# Patient Record
Sex: Male | Born: 1985 | Race: White | Hispanic: No | Marital: Married | Smoking: Former smoker
Health system: Southern US, Community
[De-identification: ages and names within clinical notes are randomized; demographics above are authoritative.]

## PROBLEM LIST (undated history)

## (undated) DIAGNOSIS — F1910 Other psychoactive substance abuse, uncomplicated: Secondary | ICD-10-CM

## (undated) DIAGNOSIS — I77810 Thoracic aortic ectasia: Secondary | ICD-10-CM

## (undated) DIAGNOSIS — Z8619 Personal history of other infectious and parasitic diseases: Secondary | ICD-10-CM

## (undated) DIAGNOSIS — F32A Depression, unspecified: Secondary | ICD-10-CM

## (undated) DIAGNOSIS — N189 Chronic kidney disease, unspecified: Secondary | ICD-10-CM

## (undated) DIAGNOSIS — F329 Major depressive disorder, single episode, unspecified: Secondary | ICD-10-CM

## (undated) DIAGNOSIS — I10 Essential (primary) hypertension: Secondary | ICD-10-CM

## (undated) DIAGNOSIS — I480 Paroxysmal atrial fibrillation: Secondary | ICD-10-CM

## (undated) DIAGNOSIS — Z9289 Personal history of other medical treatment: Secondary | ICD-10-CM

## (undated) HISTORY — DX: Personal history of other medical treatment: Z92.89

## (undated) HISTORY — DX: Depression, unspecified: F32.A

## (undated) HISTORY — DX: Thoracic aortic ectasia: I77.810

## (undated) HISTORY — DX: Major depressive disorder, single episode, unspecified: F32.9

## (undated) HISTORY — DX: Essential (primary) hypertension: I10

## (undated) HISTORY — DX: Personal history of other infectious and parasitic diseases: Z86.19

## (undated) HISTORY — DX: Other psychoactive substance abuse, uncomplicated: F19.10

## (undated) HISTORY — DX: Paroxysmal atrial fibrillation: I48.0

## (undated) HISTORY — DX: Chronic kidney disease, unspecified: N18.9

---
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
## 2008-03-22 ENCOUNTER — Ambulatory Visit (HOSPITAL_COMMUNITY): Admission: RE | Admit: 2008-03-22 | Discharge: 2008-03-22 | Payer: Self-pay | Admitting: Urology

## 2008-08-31 ENCOUNTER — Emergency Department: Payer: Self-pay | Admitting: Emergency Medicine

## 2009-11-05 ENCOUNTER — Inpatient Hospital Stay: Payer: Self-pay | Admitting: Psychiatry

## 2009-11-16 ENCOUNTER — Emergency Department: Payer: Self-pay | Admitting: Emergency Medicine

## 2012-09-18 ENCOUNTER — Emergency Department: Payer: Self-pay | Admitting: Emergency Medicine

## 2012-10-08 ENCOUNTER — Emergency Department: Payer: Self-pay | Admitting: Emergency Medicine

## 2012-10-08 LAB — DRUG SCREEN, URINE
Barbiturates, Ur Screen: NEGATIVE (ref ?–200)
Benzodiazepine, Ur Scrn: NEGATIVE (ref ?–200)
Cocaine Metabolite,Ur ~~LOC~~: NEGATIVE (ref ?–300)
MDMA (Ecstasy)Ur Screen: NEGATIVE (ref ?–500)
Phencyclidine (PCP) Ur S: NEGATIVE (ref ?–25)

## 2012-10-08 LAB — URINALYSIS, COMPLETE
Bilirubin,UR: NEGATIVE
Ketone: NEGATIVE
Nitrite: NEGATIVE
Protein: NEGATIVE
RBC,UR: 1 /HPF (ref 0–5)
Squamous Epithelial: NONE SEEN

## 2012-10-08 LAB — COMPREHENSIVE METABOLIC PANEL
Albumin: 4.1 g/dL (ref 3.4–5.0)
BUN: 14 mg/dL (ref 7–18)
Chloride: 107 mmol/L (ref 98–107)
Creatinine: 1.18 mg/dL (ref 0.60–1.30)
Glucose: 117 mg/dL — ABNORMAL HIGH (ref 65–99)
Osmolality: 281 (ref 275–301)
SGOT(AST): 30 U/L (ref 15–37)

## 2012-10-09 LAB — CBC
MCH: 30.8 pg (ref 26.0–34.0)
Platelet: 211 10*3/uL (ref 150–440)

## 2014-04-12 ENCOUNTER — Inpatient Hospital Stay: Payer: Self-pay | Admitting: Internal Medicine

## 2014-04-12 LAB — COMPREHENSIVE METABOLIC PANEL
ALBUMIN: 4.3 g/dL (ref 3.4–5.0)
ALK PHOS: 57 U/L (ref 46–116)
ALT: 24 U/L (ref 14–63)
Anion Gap: 7 (ref 7–16)
BILIRUBIN TOTAL: 0.9 mg/dL (ref 0.2–1.0)
BUN: 13 mg/dL (ref 7–18)
CO2: 27 mmol/L (ref 21–32)
CREATININE: 1.43 mg/dL — AB (ref 0.60–1.30)
Calcium, Total: 9 mg/dL (ref 8.5–10.1)
Chloride: 103 mmol/L (ref 98–107)
EGFR (African American): >60
GLUCOSE: 101 mg/dL — AB (ref 65–99)
Osmolality: 274 (ref 275–301)
Potassium: 3.9 mmol/L (ref 3.5–5.1)
SGOT(AST): 11 U/L — ABNORMAL LOW (ref 15–37)
Sodium: 137 mmol/L (ref 136–145)
Total Protein: 7.1 g/dL (ref 6.4–8.2)

## 2014-04-12 LAB — CBC
HCT: 46.9 % (ref 40.0–52.0)
HGB: 15.6 g/dL (ref 13.0–18.0)
MCH: 30.5 pg (ref 26.0–34.0)
MCHC: 33.2 g/dL (ref 32.0–36.0)
MCV: 92 fL (ref 80–100)
PLATELETS: 222 10*3/uL (ref 150–440)
RBC: 5.1 10*6/uL (ref 4.40–5.90)
RDW: 12.7 % (ref 11.5–14.5)
WBC: 22.6 10*3/uL — AB (ref 3.8–10.6)

## 2014-04-12 LAB — DRUG SCREEN, URINE
AMPHETAMINES, UR SCREEN: NEGATIVE (ref ?–1000)
BARBITURATES, UR SCREEN: NEGATIVE (ref ?–200)
Benzodiazepine, Ur Scrn: NEGATIVE (ref ?–200)
COCAINE METABOLITE, UR ~~LOC~~: NEGATIVE (ref ?–300)
Cannabinoid 50 Ng, Ur ~~LOC~~: POSITIVE (ref ?–50)
MDMA (ECSTASY) UR SCREEN: NEGATIVE (ref ?–500)
Methadone, Ur Screen: NEGATIVE (ref ?–300)
Opiate, Ur Screen: POSITIVE (ref ?–300)
Phencyclidine (PCP) Ur S: NEGATIVE (ref ?–25)
TRICYCLIC, UR SCREEN: NEGATIVE (ref ?–1000)

## 2014-04-12 LAB — D-DIMER(ARMC): D-DIMER: 116 ng/mL

## 2014-04-12 LAB — PRO B NATRIURETIC PEPTIDE: B-TYPE NATIURETIC PEPTID: 28 pg/mL (ref 0–125)

## 2014-04-12 LAB — TROPONIN I

## 2014-04-13 LAB — CBC WITH DIFFERENTIAL/PLATELET
Basophil #: 0 10*3/uL (ref 0.0–0.1)
Basophil %: 0.1 %
EOS PCT: 0.2 %
Eosinophil #: 0 10*3/uL (ref 0.0–0.7)
HCT: 42.9 % (ref 40.0–52.0)
HGB: 14.4 g/dL (ref 13.0–18.0)
LYMPHS ABS: 1.4 10*3/uL (ref 1.0–3.6)
Lymphocyte %: 8.2 %
MCH: 30.8 pg (ref 26.0–34.0)
MCHC: 33.6 g/dL (ref 32.0–36.0)
MCV: 92 fL (ref 80–100)
MONO ABS: 1.1 x10 3/mm — AB (ref 0.2–1.0)
Monocyte %: 6.8 %
Neutrophil #: 14.3 10*3/uL — ABNORMAL HIGH (ref 1.4–6.5)
Neutrophil %: 84.7 %
PLATELETS: 204 10*3/uL (ref 150–440)
RBC: 4.69 10*6/uL (ref 4.40–5.90)
RDW: 12.5 % (ref 11.5–14.5)
WBC: 16.8 10*3/uL — ABNORMAL HIGH (ref 3.8–10.6)

## 2014-04-17 LAB — CULTURE, BLOOD (SINGLE)

## 2014-06-12 ENCOUNTER — Encounter: Payer: Self-pay | Admitting: Family Medicine

## 2014-06-12 ENCOUNTER — Encounter (INDEPENDENT_AMBULATORY_CARE_PROVIDER_SITE_OTHER): Payer: Self-pay

## 2014-06-12 ENCOUNTER — Ambulatory Visit (INDEPENDENT_AMBULATORY_CARE_PROVIDER_SITE_OTHER): Payer: Self-pay | Admitting: Family Medicine

## 2014-06-12 ENCOUNTER — Ambulatory Visit (INDEPENDENT_AMBULATORY_CARE_PROVIDER_SITE_OTHER)
Admission: RE | Admit: 2014-06-12 | Discharge: 2014-06-12 | Disposition: A | Discharge status: Home / Self Care | Payer: Self-pay | Source: Ambulatory Visit | Attending: Family Medicine | Admitting: Family Medicine

## 2014-06-12 ENCOUNTER — Telehealth: Payer: Self-pay | Admitting: Family Medicine

## 2014-06-12 VITALS — BP 170/110 | HR 61 | Temp 98.3°F | Ht 68.5 in | Wt 230.5 lb

## 2014-06-12 DIAGNOSIS — I10 Essential (primary) hypertension: Secondary | ICD-10-CM

## 2014-06-12 DIAGNOSIS — Z8701 Personal history of pneumonia (recurrent): Secondary | ICD-10-CM

## 2014-06-12 MED ORDER — AMLODIPINE BESYLATE 5 MG PO TABS: 5.0000 mg | ORAL_TABLET | Freq: Every day | ORAL | Status: DC

## 2014-06-12 NOTE — Patient Instructions (Signed)
Go to the lab on the way out.  We'll contact you with your xray report. Start amlodipine 5mg  a day for blood pressure.  Limit salt (especially fast food). Update me in about 1 week with your BP readings (call/fax/note). Let me know about your other BP medicine and dose from [facility]  We'll go from there.  Take care.

## 2014-06-12 NOTE — Telephone Encounter (Signed)
emmi emailed °

## 2014-06-12 NOTE — Progress Notes (Signed)
Pre visit review using our clinic review tool, if applicable. No additional management support is needed unless otherwise documented below in the visit note.  New patient.  Longstanding HTN.  Was on meds after recent hospitalization, doesn't know the name.  Didn't check BP on med.  Off med now.  H/o cocaine use, in remission.  Advised patient that cocaine use can be fatal.  D/w pt about BP meds and cocaine, along with salt, HTN path/phys.  No CP, SOB, BLE edema.   H/o PNA about 2 months ago, requesting records.  Treated with abx, no sx now.  No cough, no fevers.  Due for f/u CXR.  D/w pt.   PMH and SH reviewed  ROS: See HPI, otherwise noncontributory.  Meds, vitals, and allergies reviewed.   GEN: nad, alert and oriented HEENT: mucous membranes moist NECK: supple w/o LA CV: rrr.  no murmur PULM: ctab, no inc wob ABD: soft, +bs EXT: no edema SKIN: no acute rash

## 2014-06-13 ENCOUNTER — Encounter: Payer: Self-pay | Admitting: Family Medicine

## 2014-06-13 ENCOUNTER — Telehealth: Payer: Self-pay

## 2014-06-13 DIAGNOSIS — I10 Essential (primary) hypertension: Secondary | ICD-10-CM | POA: Insufficient documentation

## 2014-06-13 DIAGNOSIS — Z8701 Personal history of pneumonia (recurrent): Secondary | ICD-10-CM | POA: Insufficient documentation

## 2014-06-13 NOTE — Telephone Encounter (Signed)
Patient aware of normal xray.

## 2014-06-13 NOTE — Assessment & Plan Note (Signed)
See above.  Start amlodipine, avoid BB (denies current cocaine use), he'll update me with BP readings.  Avoid salt.  Will check inpatient records.  >30 minutes spent in face to face time with patient, >50% spent in counselling or coordination of care.

## 2014-06-13 NOTE — Telephone Encounter (Signed)
-----   Message from [name], MD sent at 06/13/2014 11:24 AM EDT ----- Call pt.  CXR wnl.   Thanks.

## 2014-06-13 NOTE — Assessment & Plan Note (Signed)
Recheck cxr today, requesting records.

## 2014-07-15 NOTE — Discharge Summary (Signed)
PATIENT NAME:  Jeremiah Keith, Jeremiah Keith MR#:  [id number] DATE OF BIRTH:  [birth year]  DATE OF ADMISSION:  04/12/2014 DATE OF DISCHARGE:  04/13/2014  PRESENTING COMPLAINT: Shortness of breath and chest pain.   DISCHARGE DIAGNOSES:  1.  Right-sided pneumonia.  2.  Hypertension.  3.  Tobacco abuse.  DISCHARGE VITAL SIGNS: Saturation on discharge was 97% on room air. Temperature is 97.5, pulse is 75. Blood pressure is 165/96.   MEDICATIONS AT DISCHARGE:  1.  Metoprolol extended release 50 mg p.o. daily.  2.  Keflex 500 mg 3 times a day.  3.  Doxycycline 100 mg b.i.d.   FOLLOWUP: Patient advised to establish primary care in the area. If needed, go to the Emergency Room or Urgent Care.   DISCHARGE PHYSICAL EXAMINATION: LUNGS: Clear to auscultation. No wheezing, crepitations.  HEART: Both the heart sounds are normal. No murmur heard.  ABDOMEN: Soft, nontender. No organomegaly.  NEUROLOGICAL: Grossly intact cranial nerves II-XII. No motor or sensory deficit. This was on the day of discharge.   BRIEF SUMMARY OF HOSPITAL COURSE: Mr. Gima is a 29 year old Caucasian gentleman with history of hypertension, does not take any medications, comes into the Emergency Room with:  1.  Right-sided pleuritic chest pain, was found to have right lower lobe pneumonia. He was started on IV antibiotics. The patient remained clinically stable. His saturations were 97% on room air. He has changed to p.o. Keflex and doxycycline, which is generic, to help out with the cost. The patient will finish up the antibiotics and was also asked to follow up with Urgent Care or Emergency Room if needed. He is also recommended to establish with primary care physician.  2.  Hypertension. The patient was prescribed metoprolol extended release p.o. daily. Hospital stay otherwise remained stable.   CODE STATUS: The patient remained a full code.   TIME SPENT: Forty minutes.   ____________________________ Hart Rochester Posey Pronto,  MD sap:TT D: 04/17/2014 17:25:02 ET T: 04/17/2014 21:34:54 ET JOB#: [id number]  cc: Deajah Erkkila A. Posey Pronto, MD, <Dictator> Ilda Basset MD ELECTRONICALLY SIGNED 05/03/2014 23:17

## 2014-07-15 NOTE — H&P (Signed)
PATIENT NAME:  Jeremiah Keith, Jeremiah Keith MR#:  [id number] DATE OF BIRTH:  [birth year]  DATE OF ADMISSION:  04/12/2014  PRIMARY CARE PHYSICIAN: None.   REFERRING EMERGENCY ROOM PHYSICIAN: Sheryl L. Benjaman Lobe, MD   CHIEF COMPLAINT: Chest pain and coughing.   HISTORY OF PRESENTING ILLNESS: This is 29 year old male, who has a past medical history of hypertension, but he does not follow with any doctor and does not take medications. He is a smoker and for the last 2 weeks, he says that he has on and off cough, but no sputum production, and he started to notice some tingling sensation in his back in the central area, just coming on and off, but did not pay much attention to that. Cough continued to get worse and last 2 nights, he had cold sweat and excessively sweating. Today morning, he woke up around 2:30 a.m. with severe pain, which was mainly on his left side of central area of the chest. The pain was sharp and stabbing-like, like almost 10/10 and on and off, getting worse by deep breathing or coughing or any movement, so he decided to come to the Emergency Room. In the ER, his troponin was found to be negative. Chest x-ray was negative, but had white cell count up to 20,000. So, CAT scan of chest was done, which showed he has right lower lobe infiltrate, so given to hospitalist team for further management of his pain and pneumonia issues.   REVIEW OF SYSTEMS:  CONSTITUTIONAL: Negative for fever, fatigue, weakness. Had chest pain. No weight loss or weight again.  EYES: No blurring, double vision, discharge, or redness.  EARS, NOSE, THROAT: No tinnitus, ear pain, or hearing loss.  RESPIRATORY: The patient has some coughing. No sputum production. No wheezing or shortness of breath.  CARDIOVASCULAR: Had chest pain on the left side of central. No palpitations or edema.  GASTROINTESTINAL: No nausea, vomiting, diarrhea, abdominal pain.  GENITOURINARY: No dysuria, hematuria, or increased frequency.  ENDOCRINE:  No heat or cold intolerance. No excessive sweating.  SKIN: No acne, rashes, or lesions.  MUSCULOSKELETAL: No pain or swelling in the joints.  NEUROLOGICAL: No numbness, weakness, tremor, or vertigo.   PSYCHIATRIC: No anxiety, insomnia, or bipolar disorder.   PAST MEDICAL HISTORY: Hypertension, but he does not seek any attention, or any medication.   PAST SURGICAL HISTORY: None.   SOCIAL HISTORY: He lives with girlfriend and smokes 1/2 pack cigarettes since he was 29 years old, so that is for 18 years now. He was a social drinker over weekends, but stopped drinking for the last 2 weeks. He smokes pot and last was yesterday.   FAMILY HISTORY: Positive for having coronary artery disease in his father at age of 77 and having cardiac pacemaker in his grandfather.   HOME MEDICATIONS: None.   PHYSICAL EXAMINATION: VITAL SIGNS: In the ER, temperature 98.4, pulse 91, respirations 20, blood pressure 165/102, and pulse oximetry is 99% on room air.  GENERAL: The patient is fully alert and oriented to time, place, and person. Does not appear in acute distress.  HEENT: Head and neck atraumatic. Conjunctivae pink. Oral mucosa moist.  NECK: Supple. No JVD. Thyroid nontender.  RESPIRATORY: Bilateral equal and clear air entry. No wheezing or crepitation heard.  CARDIOVASCULAR: S1, S2 present, regular. No murmur. No local tenderness on the chest.  ABDOMEN: Soft, nontender. Bowel sounds present. No organomegaly.  SKIN: No acne, rashes, or lesions.  JOINTS: No swelling or tenderness.  NEUROLOGICAL: Power 5/5, follows commands, and moves  all 4 limbs. No tremor or rigidity. Sensation is intact.  PSYCHIATRIC: Does not appear any acute psychiatric illness at this time.   IMPORTANT LABORATORY RESULTS: Glucose 101, BNP is 28, BUN 13, creatinine 1.43, sodium 137, potassium 3.9, chloride 103, CO2 27, calcium is 9. Total protein 7.1, albumin is 4.3, alkaline phosphate 57, SGOT 11, SGPT 24. Troponin less than 0.02.  Urine for toxicology: Positive for cocaine. WBC 22.6, hemoglobin 15.6, and platelet count 222. MCV is 92. D-dimer is 116.    IMAGING STUDIES: CAT scan shows focal airspace disease in the medial right lower lobe, finding compatible with right lower lobe pneumonia. No evidence of dissection or pulmonary embolism.   ASSESSMENT AND PLAN: A 29 year old male who has a past history of smoking, hypertension, and family history of having coronary artery disease, came with chest pain and found having pneumonia on CT scan.  1. Pneumonia. We will give Rocephin and zithromax for this in hospital and give some nebulizer treatment, incentive spirometry, and try to get sputum culture for confirmation.  2. Chest pain. Most likely this is pleuritic pain because of his pneumonia, as it gets worse with coughing and deep breathing. As he is a smoker and has a history of hypertension, we would also just follow serial troponins to rule out cardiac issues. If first troponin is negative, admit to telemetry and follow serial troponins, and he does not need any further work-up after that if it is negative. Meanwhile, we will give Percocet to help him with chest pain.  3. Hypertension. His blood pressure is elevated, and he was not taking any medications. I will start him on amlodipine for now.  4. Mild renal insufficiency. Creatinine is 1.49. Most likely, this is secondary to his pneumonia and excessive sweating. We will encourage oral intake. He is young and ready to drink a lot of liquids, so there should not be any problem. We will check tomorrow.  5. Smoking cessation. Counseling is done for 4 minutes and offered him nicotine patch or inhalers. He does not want that and said he would be fine without that.   CODE STATUS: Full code.   TOTAL TIME SPENT ON THIS ADMISSION: 50 minutes     ____________________________ Ceasar Lund Anselm Jungling, MD vgv:mw D: 04/12/2014 12:09:04 ET T: 04/12/2014 13:26:39  ET JOB#: [id number]  cc: Ceasar Lund. Anselm Jungling, MD, <Dictator> Vaughan Basta MD ELECTRONICALLY SIGNED 04/30/2014 9:57

## 2015-04-22 ENCOUNTER — Emergency Department
Admission: EM | Admit: 2015-04-22 | Discharge: 2015-04-22 | Disposition: A | Discharge status: Home / Self Care | Payer: BLUE CROSS/BLUE SHIELD | Attending: Emergency Medicine | Admitting: Emergency Medicine

## 2015-04-22 DIAGNOSIS — M62830 Muscle spasm of back: Secondary | ICD-10-CM | POA: Diagnosis not present

## 2015-04-22 DIAGNOSIS — Z880 Allergy status to penicillin: Secondary | ICD-10-CM | POA: Diagnosis not present

## 2015-04-22 DIAGNOSIS — Z79899 Other long term (current) drug therapy: Secondary | ICD-10-CM | POA: Diagnosis not present

## 2015-04-22 DIAGNOSIS — M545 Low back pain, unspecified: Secondary | ICD-10-CM

## 2015-04-22 DIAGNOSIS — F17210 Nicotine dependence, cigarettes, uncomplicated: Secondary | ICD-10-CM | POA: Insufficient documentation

## 2015-04-22 DIAGNOSIS — I10 Essential (primary) hypertension: Secondary | ICD-10-CM | POA: Insufficient documentation

## 2015-04-22 MED ORDER — KETOROLAC TROMETHAMINE 60 MG/2ML IM SOLN
60.0000 mg | Freq: Once | INTRAMUSCULAR | Status: AC
  Administered 2015-04-22: 60 mg via INTRAMUSCULAR
  Filled 2015-04-22: qty 2

## 2015-04-22 MED ORDER — METHOCARBAMOL 500 MG PO TABS: 500.0000 mg | ORAL_TABLET | Freq: Three times a day (TID) | ORAL | Status: DC

## 2015-04-22 MED ORDER — CYCLOBENZAPRINE HCL 10 MG PO TABS: 10.0000 mg | ORAL_TABLET | Freq: Three times a day (TID) | ORAL | Status: DC | PRN

## 2015-04-22 MED ORDER — IBUPROFEN 800 MG PO TABS: 800.0000 mg | ORAL_TABLET | Freq: Three times a day (TID) | ORAL | Status: DC | PRN

## 2015-04-22 MED ORDER — TRAMADOL HCL 50 MG PO TABS: 50.0000 mg | ORAL_TABLET | Freq: Four times a day (QID) | ORAL | Status: DC | PRN

## 2015-04-22 NOTE — Discharge Instructions (Signed)
Back Pain, Adult Back pain is very common. The pain often gets better over time. The cause of back pain is usually not dangerous. Most people can learn to manage their back pain on their own.  HOME CARE  Watch your back pain for any changes. The following actions may help to lessen any pain you are feeling:  Stay active. Start with short walks on flat ground if you can. Try to walk farther each day.  Exercise regularly as told by your doctor. Exercise helps your back heal faster. It also helps avoid future injury by keeping your muscles strong and flexible.  Do not sit, drive, or stand in one place for more than 30 minutes.  Do not stay in bed. Resting more than 1-2 days can slow down your recovery.  Be careful when you bend or lift an object. Use good form when lifting:  Bend at your knees.  Keep the object close to your body.  Do not twist.  Sleep on a firm mattress. Lie on your side, and bend your knees. If you lie on your back, put a pillow under your knees.  Take medicines only as told by your doctor.  Put ice on the injured area.  Put ice in a plastic bag.  Place a towel between your skin and the bag.  Leave the ice on for 20 minutes, 2-3 times a day for the first 2-3 days. After that, you can switch between ice and heat packs.  Avoid feeling anxious or stressed. Find good ways to deal with stress, such as exercise.  Maintain a healthy weight. Extra weight puts stress on your back. GET HELP IF:   You have pain that does not go away with rest or medicine.  You have worsening pain that goes down into your legs or buttocks.  You have pain that does not get better in one week.  You have pain at night.  You lose weight.  You have a fever or chills. GET HELP RIGHT AWAY IF:  1. You cannot control when you poop (bowel movement) or pee (urinate). 2. Your arms or legs feel weak. 3. Your arms or legs lose feeling (numbness). 4. You feel sick to your stomach (nauseous)  or throw up (vomit). 5. You have belly (abdominal) pain. 6. You feel like you may pass out (faint).   This information is not intended to replace advice given to you by your health care provider. Make sure you discuss any questions you have with your health care provider.   Document Released: 08/19/2007 Document Revised: 03/23/2014 Document Reviewed: 07/04/2013 Elsevier Interactive Patient Education 2016 [place].  Back Exercises If you have pain in your back, do these exercises 2-3 times each day or as told by your doctor. When the pain goes away, do the exercises once each day, but repeat the steps more times for each exercise (do more repetitions). If you do not have pain in your back, do these exercises once each day or as told by your doctor. EXERCISES Single Knee to Chest Do these steps 3-5 times in a row for each leg:  Lie on your back on a firm bed or the floor with your legs stretched out.  Bring one knee to your chest.  Hold your knee to your chest by grabbing your knee or thigh.  Pull on your knee until you feel a gentle stretch in your lower back.  Keep doing the stretch for 10-30 seconds.  Slowly let go of your  leg and straighten it. Pelvic Tilt Do these steps 5-10 times in a row:  Lie on your back on a firm bed or the floor with your legs stretched out.  Bend your knees so they point up to the ceiling. Your feet should be flat on the floor.  Tighten your lower belly (abdomen) muscles to press your lower back against the floor. This will make your tailbone point up to the ceiling instead of pointing down to your feet or the floor.  Stay in this position for 5-10 seconds while you gently tighten your muscles and breathe evenly. Cat-Cow Do these steps until your lower back bends more easily: 7. Get on your hands and knees on a firm surface. Keep your hands under your shoulders, and keep your knees under your hips. You may put padding under your knees. 8. Let  your head hang down, and make your tailbone point down to the floor so your lower back is round like the back of a cat. 9. Stay in this position for 5 seconds. 10. Slowly lift your head and make your tailbone point up to the ceiling so your back hangs low (sags) like the back of a cow. 11. Stay in this position for 5 seconds. Press-Ups Do these steps 5-10 times in a row: 1. Lie on your belly (face-down) on the floor. 2. Place your hands near your head, about shoulder-width apart. 3. While you keep your back relaxed and keep your hips on the floor, slowly straighten your arms to raise the top half of your body and lift your shoulders. Do not use your back muscles. To make yourself more comfortable, you may change where you place your hands. 4. Stay in this position for 5 seconds. 5. Slowly return to lying flat on the floor. Bridges Do these steps 10 times in a row: 1. Lie on your back on a firm surface. 2. Bend your knees so they point up to the ceiling. Your feet should be flat on the floor. 3. Tighten your butt muscles and lift your butt off of the floor until your waist is almost as high as your knees. If you do not feel the muscles working in your butt and the back of your thighs, slide your feet 1-2 inches farther away from your butt. 4. Stay in this position for 3-5 seconds. 5. Slowly lower your butt to the floor, and let your butt muscles relax. If this exercise is too easy, try doing it with your arms crossed over your chest. Belly Crunches Do these steps 5-10 times in a row: 1. Lie on your back on a firm bed or the floor with your legs stretched out. 2. Bend your knees so they point up to the ceiling. Your feet should be flat on the floor. 3. Cross your arms over your chest. 4. Tip your chin a little bit toward your chest but do not bend your neck. 5. Tighten your belly muscles and slowly raise your chest just enough to lift your shoulder blades a tiny bit off of the  floor. 6. Slowly lower your chest and your head to the floor. Back Lifts Do these steps 5-10 times in a row: 1. Lie on your belly (face-down) with your arms at your sides, and rest your forehead on the floor. 2. Tighten the muscles in your legs and your butt. 3. Slowly lift your chest off of the floor while you keep your hips on the floor. Keep the back of your head  in line with the curve in your back. Look at the floor while you do this. 4. Stay in this position for 3-5 seconds. 5. Slowly lower your chest and your face to the floor. GET HELP IF:  Your back pain gets a lot worse when you do an exercise.  Your back pain does not lessen 2 hours after you exercise. If you have any of these problems, stop doing the exercises. Do not do them again unless your doctor says it is okay. GET HELP RIGHT AWAY IF:  You have sudden, very bad back pain. If this happens, stop doing the exercises. Do not do them again unless your doctor says it is okay.   This information is not intended to replace advice given to you by your health care provider. Make sure you discuss any questions you have with your health care provider.   Document Released: 04/04/2010 Document Revised: 11/21/2014 Document Reviewed: 04/26/2014 Elsevier Interactive Patient Education 2016 [employer]  Muscle Cramps and Spasms Muscle cramps and spasms are when muscles tighten by themselves. They usually get better within minutes. Muscle cramps are painful. They are usually stronger and last longer than muscle spasms. Muscle spasms may or may not be painful. They can last a few seconds or much longer. HOME CARE  Drink enough fluid to keep your pee (urine) clear or pale yellow.  Massage, stretch, and relax the muscle.  Use a warm towel, heating pad, or warm shower water on tight muscles.  Place ice on the muscle if it is tender or in pain.  Put ice in a plastic bag.  Place a towel between your skin and the bag.  Leave the ice  on for 15-20 minutes, 03-04 times a day.  Only take medicine as told by your doctor. GET HELP RIGHT AWAY IF:  Your cramps or spasms get worse, happen more often, or do not get better with time. MAKE SURE YOU:  Understand these instructions.  Will watch your condition.  Will get help right away if you are not doing well or get worse.   This information is not intended to replace advice given to you by your health care provider. Make sure you discuss any questions you have with your health care provider.   Document Released: 02/13/2008 Document Revised: 06/27/2012 Document Reviewed: 02/17/2012 Elsevier Interactive Patient Education [employer].

## 2015-04-22 NOTE — ED Notes (Signed)
Pt arrived to ED with c/o lower back pain x 3 days. Pt denies injury but does reports heavy lifting at work. Pt reports increased pain with movement.

## 2015-04-22 NOTE — ED Provider Notes (Signed)
[facility] Emergency Department Provider Note  ____________________________________________  Time seen: Approximately 7:35 PM  I have reviewed the triage vital signs and the nursing notes.   HISTORY  Chief Complaint Back Pain    HPI Jeremiah Keith is a 30 y.o. male , NAD, presents to the emergency department with lower back pain 3 days that is worsening. Denies any specific injuries or traumas but notes he lifts and moves heavy items at work on a daily basis. Has been taking Goody powders with no alleviation. States it's hard to sleep at night due to pain. Pain increases with laying flat or sitting for longer periods of time. Denies any numbness, weakness, tingling. No changes in bowel or urinary habits. Denies any saddle paresthesias. No history of lower back pain.   Past Medical History  Diagnosis Date  . History of chicken pox   . Hypertension   . Depression     after death of family members  . Substance abuse     h/o cocaine abuse    Patient Active Problem List   Diagnosis Date Noted  . History of pneumonia 06/13/2014  . HTN (hypertension) 06/13/2014    Past Surgical History  Procedure Laterality Date  . Tonsillectomy and adenoidectomy  1990    Current Outpatient Rx  Name  Route  Sig  Dispense  Refill  . amLODipine (NORVASC) 5 MG tablet   Oral   Take 1 tablet (5 mg total) by mouth daily.   30 tablet   3   . ibuprofen (ADVIL,MOTRIN) 800 MG tablet   Oral   Take 1 tablet (800 mg total) by mouth every 8 (eight) hours as needed (pain).   60 tablet   0   . ibuprofen (ADVIL,MOTRIN) 800 MG tablet   Oral   Take 1 tablet (800 mg total) by mouth every 8 (eight) hours as needed (pain).   60 tablet   0   . methocarbamol (ROBAXIN) 500 MG tablet   Oral   Take 1 tablet (500 mg total) by mouth 3 (three) times daily.   21 tablet   0   . traMADol (ULTRAM) 50 MG tablet   Oral   Take 1 tablet (50 mg total) by mouth every 6 (six) hours  as needed.   10 tablet   0     Allergies Penicillins  Family History  Problem Relation Age of Onset  . Heart disease Mother   . Diabetes Father   . Hypertension Father   . Prostate cancer Maternal Grandfather     possible prostate cancer  . Colon cancer Neg Hx     Social History Social History  Substance Use Topics  . Smoking status: Current Every Day Smoker -- 0.50 packs/day    Types: Cigarettes  . Smokeless tobacco: Never Used  . Alcohol Use: 0.0 oz/week    0 Standard drinks or equivalent per week     Review of Systems  Constitutional: No fever/chills, fatigue.  Cardiovascular: No chest pain. Respiratory: No shortness of breath.  Gastrointestinal: No abdominal pain.  No nausea, vomiting.   Genitourinary: Negative for dysuria. No hematuria. No urinary hesitancy, urgency or increased frequency. Musculoskeletal: Positive for back pain.  Skin: Negative for rash. Neurological: Negative for headaches, focal weakness or numbness. 10-point ROS otherwise negative.  ____________________________________________   PHYSICAL EXAM:  VITAL SIGNS: ED Triage Vitals  Enc Vitals Group     BP --      Pulse --  Resp --      Temp --      Temp src --      SpO2 --      Weight --      Height --      Head Cir --      Peak Flow --      Pain Score --      Pain Loc --      Pain Edu? --      Excl. in [place]? --     Constitutional: Alert and oriented. Well appearing and in no acute distress. Eyes: Conjunctivae are normal.  Head: Atraumatic. Respiratory: Normal respiratory effort without tachypnea or retractions. Musculoskeletal: Diffuse tenderness to palpation over the lumbar spine. Mild muscle spasm noted left of the lumbar spine. No SI joint tenderness to palpation. Negative straight leg raise bilaterally. Full range of motion of lumbar spine with some discomfort with full flexion, left lateral flexion. Neurologic:  Normal speech and language. No gross focal neurologic  deficits are appreciated.  Skin:  Skin is warm, dry and intact. No rash noted. Psychiatric: Mood and affect are normal. Speech and behavior are normal. Patient exhibits appropriate insight and judgement.   ____________________________________________   LABS  None  ____________________________________________  EKG  None ____________________________________________  RADIOLOGY  None  ____________________________________________    PROCEDURES  Procedure(s) performed: None    Medications  ketorolac (TORADOL) injection 60 mg (60 mg Intramuscular Given 04/22/15 1957)     ____________________________________________   INITIAL IMPRESSION / ASSESSMENT AND PLAN / ED COURSE  Patient's diagnosis is consistent with lumbar go with muscle spasm. Patient will be discharged home with prescriptions for private 800 mg to take 3 times daily with meals as needed for pain and inflammation, cyclobenzaprine 10 mg to take one tablet by mouth up to 3 times daily as needed for muscle spasm, Ultram 50 mg to take one tablet by mouth every 6 hours as needed for severe pain and to be used sparingly. Patient advised against use of alcohol or operating motor vehicles or heavy machinery while taking cyclobenzaprine and/or tramadol.  Will provide a work note to be out for 2 days then return on light duty including no lifting greater than 10 pounds for one week. Patient is to follow up with [facility] clinic to be cleared to work full duty and to follow up on back pain. Patient is given ED precautions to return to the ED for any worsening or new symptoms.    ____________________________________________  FINAL CLINICAL IMPRESSION(S) / ED DIAGNOSES  Final diagnoses:  Bilateral low back pain without sciatica  Muscle spasm of back      NEW MEDICATIONS STARTED DURING THIS VISIT:  New Prescriptions   IBUPROFEN (ADVIL,MOTRIN) 800 MG TABLET    Take 1 tablet (800 mg total) by mouth every 8 (eight) hours as  needed (pain).   IBUPROFEN (ADVIL,MOTRIN) 800 MG TABLET    Take 1 tablet (800 mg total) by mouth every 8 (eight) hours as needed (pain).   METHOCARBAMOL (ROBAXIN) 500 MG TABLET    Take 1 tablet (500 mg total) by mouth 3 (three) times daily.   TRAMADOL (ULTRAM) 50 MG TABLET    Take 1 tablet (50 mg total) by mouth every 6 (six) hours as needed.         Braxton Feathers, PA-C 04/22/15 2014  Lavonia Drafts, MD 04/22/15 2325

## 2015-05-01 ENCOUNTER — Emergency Department (HOSPITAL_COMMUNITY)
Admission: EM | Admit: 2015-05-01 | Discharge: 2015-05-01 | Disposition: A | Discharge status: Home / Self Care | Payer: BLUE CROSS/BLUE SHIELD | Attending: Emergency Medicine | Admitting: Emergency Medicine

## 2015-05-01 ENCOUNTER — Encounter (HOSPITAL_COMMUNITY): Payer: Self-pay | Admitting: *Deleted

## 2015-05-01 DIAGNOSIS — Z79899 Other long term (current) drug therapy: Secondary | ICD-10-CM | POA: Insufficient documentation

## 2015-05-01 DIAGNOSIS — Z880 Allergy status to penicillin: Secondary | ICD-10-CM | POA: Insufficient documentation

## 2015-05-01 DIAGNOSIS — F17210 Nicotine dependence, cigarettes, uncomplicated: Secondary | ICD-10-CM | POA: Insufficient documentation

## 2015-05-01 DIAGNOSIS — M5441 Lumbago with sciatica, right side: Secondary | ICD-10-CM | POA: Insufficient documentation

## 2015-05-01 DIAGNOSIS — Z8619 Personal history of other infectious and parasitic diseases: Secondary | ICD-10-CM | POA: Diagnosis not present

## 2015-05-01 DIAGNOSIS — I10 Essential (primary) hypertension: Secondary | ICD-10-CM | POA: Diagnosis not present

## 2015-05-01 DIAGNOSIS — M545 Low back pain: Secondary | ICD-10-CM | POA: Diagnosis present

## 2015-05-01 DIAGNOSIS — F329 Major depressive disorder, single episode, unspecified: Secondary | ICD-10-CM | POA: Insufficient documentation

## 2015-05-01 MED ORDER — KETOROLAC TROMETHAMINE 60 MG/2ML IM SOLN
60.0000 mg | Freq: Once | INTRAMUSCULAR | Status: AC
  Administered 2015-05-01: 60 mg via INTRAMUSCULAR
  Filled 2015-05-01: qty 2

## 2015-05-01 MED ORDER — METHOCARBAMOL 500 MG PO TABS
500.0000 mg | ORAL_TABLET | Freq: Once | ORAL | Status: AC
  Administered 2015-05-01: 500 mg via ORAL
  Filled 2015-05-01: qty 1

## 2015-05-01 MED ORDER — PREDNISONE 50 MG PO TABS: ORAL_TABLET | ORAL | Status: DC

## 2015-05-01 MED ORDER — CYCLOBENZAPRINE HCL 10 MG PO TABS: 10.0000 mg | ORAL_TABLET | Freq: Two times a day (BID) | ORAL | Status: DC | PRN

## 2015-05-01 NOTE — ED Notes (Signed)
PT reports ongoing Lower back pain. Last seen 04-22-15. Pain has not improved. Pt reports pain  is worse with sitting. Pt also has elevated BP . Pt states" I have BP meds but refuse to take the med."

## 2015-05-01 NOTE — ED Notes (Signed)
Declined W/C at D/C and was escorted to lobby by RN. 

## 2015-05-01 NOTE — Discharge Instructions (Signed)
Sciatica With Rehab The sciatic nerve runs from the back down the leg and is responsible for sensation and control of the muscles in the back (posterior) side of the thigh, lower leg, and foot. Sciatica is a condition that is characterized by inflammation of this nerve.  SYMPTOMS   Signs of nerve damage, including numbness and/or weakness along the posterior side of the lower extremity.  Pain in the back of the thigh that may also travel down the leg.  Pain that worsens when sitting for long periods of time.  Occasionally, pain in the back or buttock. CAUSES  Inflammation of the sciatic nerve is the cause of sciatica. The inflammation is due to something irritating the nerve. Common sources of irritation include:  Sitting for long periods of time.  Direct trauma to the nerve.  Arthritis of the spine.  Herniated or ruptured disk.  Slipping of the vertebrae (spondylolisthesis).  Pressure from soft tissues, such as muscles or ligament-like tissue (fascia). RISK INCREASES WITH:  Sports that place pressure or stress on the spine (football or weightlifting).  Poor strength and flexibility.  Failure to warm up properly before activity.  Family history of low back pain or disk disorders.  Previous back injury or surgery.  Poor body mechanics, especially when lifting, or poor posture. PREVENTION   Warm up and stretch properly before activity.  Maintain physical fitness:  Strength, flexibility, and endurance.  Cardiovascular fitness.  Learn and use proper technique, especially with posture and lifting. When possible, have coach correct improper technique.  Avoid activities that place stress on the spine. PROGNOSIS If treated properly, then sciatica usually resolves within 6 weeks. However, occasionally surgery is necessary.  RELATED COMPLICATIONS   Permanent nerve damage, including pain, numbness, tingle, or weakness.  Chronic back pain.  Risks of surgery: infection,  bleeding, nerve damage, or damage to surrounding tissues. TREATMENT Treatment initially involves resting from any activities that aggravate your symptoms. The use of ice and medication may help reduce pain and inflammation. The use of strengthening and stretching exercises may help reduce pain with activity. These exercises may be performed at home or with referral to a therapist. A therapist may recommend further treatments, such as transcutaneous electronic nerve stimulation (TENS) or ultrasound. Your caregiver may recommend corticosteroid injections to help reduce inflammation of the sciatic nerve. If symptoms persist despite non-surgical (conservative) treatment, then surgery may be recommended. MEDICATION  If pain medication is necessary, then nonsteroidal anti-inflammatory medications, such as aspirin and ibuprofen, or other minor pain relievers, such as acetaminophen, are often recommended.  Do not take pain medication for 7 days before surgery.  Prescription pain relievers may be given if deemed necessary by your caregiver. Use only as directed and only as much as you need.  Ointments applied to the skin may be helpful.  Corticosteroid injections may be given by your caregiver. These injections should be reserved for the most serious cases, because they may only be given a certain number of times. HEAT AND COLD  Cold treatment (icing) relieves pain and reduces inflammation. Cold treatment should be applied for 10 to 15 minutes every 2 to 3 hours for inflammation and pain and immediately after any activity that aggravates your symptoms. Use ice packs or massage the area with a piece of ice (ice massage).  Heat treatment may be used prior to performing the stretching and strengthening activities prescribed by your caregiver, physical therapist, or athletic trainer. Use a heat pack or soak the injury in warm water.  SEEK MEDICAL CARE IF:  Treatment seems to offer no benefit, or the condition  worsens.  Any medications produce adverse side effects. EXERCISES  RANGE OF MOTION (ROM) AND STRETCHING EXERCISES - Sciatica Most people with sciatic will find that their symptoms worsen with either excessive bending forward (flexion) or arching at the low back (extension). The exercises which will help resolve your symptoms will focus on the opposite motion. Your physician, physical therapist or athletic trainer will help you determine which exercises will be most helpful to resolve your low back pain. Do not complete any exercises without first consulting with your clinician. Discontinue any exercises which worsen your symptoms until you speak to your clinician. If you have pain, numbness or tingling which travels down into your buttocks, leg or foot, the goal of the therapy is for these symptoms to move closer to your back and eventually resolve. Occasionally, these leg symptoms will get better, but your low back pain may worsen; this is typically an indication of progress in your rehabilitation. Be certain to be very alert to any changes in your symptoms and the activities in which you participated in the 24 hours prior to the change. Sharing this information with your clinician will allow him/her to most efficiently treat your condition. These exercises may help you when beginning to rehabilitate your injury. Your symptoms may resolve with or without further involvement from your physician, physical therapist or athletic trainer. While completing these exercises, remember:   Restoring tissue flexibility helps normal motion to return to the joints. This allows healthier, less painful movement and activity.  An effective stretch should be held for at least 30 seconds.  A stretch should never be painful. You should only feel a gentle lengthening or release in the stretched tissue. FLEXION RANGE OF MOTION AND STRETCHING EXERCISES: STRETCH - Flexion, Single Knee to Chest   Lie on a firm bed or floor  with both legs extended in front of you.  Keeping one leg in contact with the floor, bring your opposite knee to your chest. Hold your leg in place by either grabbing behind your thigh or at your knee.  Pull until you feel a gentle stretch in your low back. Hold __________ seconds.  Slowly release your grasp and repeat the exercise with the opposite side. Repeat __________ times. Complete this exercise __________ times per day.  STRETCH - Flexion, Double Knee to Chest  Lie on a firm bed or floor with both legs extended in front of you.  Keeping one leg in contact with the floor, bring your opposite knee to your chest.  Tense your stomach muscles to support your back and then lift your other knee to your chest. Hold your legs in place by either grabbing behind your thighs or at your knees.  Pull both knees toward your chest until you feel a gentle stretch in your low back. Hold __________ seconds.  Tense your stomach muscles and slowly return one leg at a time to the floor. Repeat __________ times. Complete this exercise __________ times per day.  STRETCH - Low Trunk Rotation   Lie on a firm bed or floor. Keeping your legs in front of you, bend your knees so they are both pointed toward the ceiling and your feet are flat on the floor.  Extend your arms out to the side. This will stabilize your upper body by keeping your shoulders in contact with the floor.  Gently and slowly drop both knees together to one side until  you feel a gentle stretch in your low back. Hold for __________ seconds.  Tense your stomach muscles to support your low back as you bring your knees back to the starting position. Repeat the exercise to the other side. Repeat __________ times. Complete this exercise __________ times per day  EXTENSION RANGE OF MOTION AND FLEXIBILITY EXERCISES: STRETCH - Extension, Prone on Elbows  Lie on your stomach on the floor, a bed will be too soft. Place your palms about shoulder  width apart and at the height of your head.  Place your elbows under your shoulders. If this is too painful, stack pillows under your chest.  Allow your body to relax so that your hips drop lower and make contact more completely with the floor.  Hold this position for __________ seconds.  Slowly return to lying flat on the floor. Repeat __________ times. Complete this exercise __________ times per day.  RANGE OF MOTION - Extension, Prone Press Ups  Lie on your stomach on the floor, a bed will be too soft. Place your palms about shoulder width apart and at the height of your head.  Keeping your back as relaxed as possible, slowly straighten your elbows while keeping your hips on the floor. You may adjust the placement of your hands to maximize your comfort. As you gain motion, your hands will come more underneath your shoulders.  Hold this position __________ seconds.  Slowly return to lying flat on the floor. Repeat __________ times. Complete this exercise __________ times per day.  STRENGTHENING EXERCISES - Sciatica  These exercises may help you when beginning to rehabilitate your injury. These exercises should be done near your "sweet spot." This is the neutral, low-back arch, somewhere between fully rounded and fully arched, that is your least painful position. When performed in this safe range of motion, these exercises can be used for people who have either a flexion or extension based injury. These exercises may resolve your symptoms with or without further involvement from your physician, physical therapist or athletic trainer. While completing these exercises, remember:   Muscles can gain both the endurance and the strength needed for everyday activities through controlled exercises.  Complete these exercises as instructed by your physician, physical therapist or athletic trainer. Progress with the resistance and repetition exercises only as your caregiver advises.  You may  experience muscle soreness or fatigue, but the pain or discomfort you are trying to eliminate should never worsen during these exercises. If this pain does worsen, stop and make certain you are following the directions exactly. If the pain is still present after adjustments, discontinue the exercise until you can discuss the trouble with your clinician. STRENGTHENING - Deep Abdominals, Pelvic Tilt   Lie on a firm bed or floor. Keeping your legs in front of you, bend your knees so they are both pointed toward the ceiling and your feet are flat on the floor.  Tense your lower abdominal muscles to press your low back into the floor. This motion will rotate your pelvis so that your tail bone is scooping upwards rather than pointing at your feet or into the floor.  With a gentle tension and even breathing, hold this position for __________ seconds. Repeat __________ times. Complete this exercise __________ times per day.  STRENGTHENING - Abdominals, Crunches   Lie on a firm bed or floor. Keeping your legs in front of you, bend your knees so they are both pointed toward the ceiling and your feet are flat on the  floor. Cross your arms over your chest.  Slightly tip your chin down without bending your neck.  Tense your abdominals and slowly lift your trunk high enough to just clear your shoulder blades. Lifting higher can put excessive stress on the low back and does not further strengthen your abdominal muscles.  Control your return to the starting position. Repeat __________ times. Complete this exercise __________ times per day.  STRENGTHENING - Quadruped, Opposite UE/LE Lift  Assume a hands and knees position on a firm surface. Keep your hands under your shoulders and your knees under your hips. You may place padding under your knees for comfort.  Find your neutral spine and gently tense your abdominal muscles so that you can maintain this position. Your shoulders and hips should form a rectangle  that is parallel with the floor and is not twisted.  Keeping your trunk steady, lift your right hand no higher than your shoulder and then your left leg no higher than your hip. Make sure you are not holding your breath. Hold this position __________ seconds.  Continuing to keep your abdominal muscles tense and your back steady, slowly return to your starting position. Repeat with the opposite arm and leg. Repeat __________ times. Complete this exercise __________ times per day.  STRENGTHENING - Abdominals and Quadriceps, Straight Leg Raise   Lie on a firm bed or floor with both legs extended in front of you.  Keeping one leg in contact with the floor, bend the other knee so that your foot can rest flat on the floor.  Find your neutral spine, and tense your abdominal muscles to maintain your spinal position throughout the exercise.  Slowly lift your straight leg off the floor about 6 inches for a count of 15, making sure to not hold your breath.  Still keeping your neutral spine, slowly lower your leg all the way to the floor. Repeat this exercise with each leg __________ times. Complete this exercise __________ times per day. POSTURE AND BODY MECHANICS CONSIDERATIONS - Sciatica Keeping correct posture when sitting, standing or completing your activities will reduce the stress put on different body tissues, allowing injured tissues a chance to heal and limiting painful experiences. The following are general guidelines for improved posture. Your physician or physical therapist will provide you with any instructions specific to your needs. While reading these guidelines, remember:  The exercises prescribed by your provider will help you have the flexibility and strength to maintain correct postures.  The correct posture provides the optimal environment for your joints to work. All of your joints have less wear and tear when properly supported by a spine with good posture. This means you will  experience a healthier, less painful body.  Correct posture must be practiced with all of your activities, especially prolonged sitting and standing. Correct posture is as important when doing repetitive low-stress activities (typing) as it is when doing a single heavy-load activity (lifting). RESTING POSITIONS Consider which positions are most painful for you when choosing a resting position. If you have pain with flexion-based activities (sitting, bending, stooping, squatting), choose a position that allows you to rest in a less flexed posture. You would want to avoid curling into a fetal position on your side. If your pain worsens with extension-based activities (prolonged standing, working overhead), avoid resting in an extended position such as sleeping on your stomach. Most people will find more comfort when they rest with their spine in a more neutral position, neither too rounded nor too  arched. Lying on a non-sagging bed on your side with a pillow between your knees, or on your back with a pillow under your knees will often provide some relief. Keep in mind, being in any one position for a prolonged period of time, no matter how correct your posture, can still lead to stiffness. PROPER SITTING POSTURE In order to minimize stress and discomfort on your spine, you must sit with correct posture Sitting with good posture should be effortless for a healthy body. Returning to good posture is a gradual process. Many people can work toward this most comfortably by using various supports until they have the flexibility and strength to maintain this posture on their own. When sitting with proper posture, your ears will fall over your shoulders and your shoulders will fall over your hips. You should use the back of the chair to support your upper back. Your low back will be in a neutral position, just slightly arched. You may place a small pillow or folded towel at the base of your low back for support.  When  working at a desk, create an environment that supports good, upright posture. Without extra support, muscles fatigue and lead to excessive strain on joints and other tissues. Keep these recommendations in mind: CHAIR:   A chair should be able to slide under your desk when your back makes contact with the back of the chair. This allows you to work closely.  The chair's height should allow your eyes to be level with the upper part of your monitor and your hands to be slightly lower than your elbows. BODY POSITION  Your feet should make contact with the floor. If this is not possible, use a foot rest.  Keep your ears over your shoulders. This will reduce stress on your neck and low back. INCORRECT SITTING POSTURES   If you are feeling tired and unable to assume a healthy sitting posture, do not slouch or slump. This puts excessive strain on your back tissues, causing more damage and pain. Healthier options include:  Using more support, like a lumbar pillow.  Switching tasks to something that requires you to be upright or walking.  Talking a brief walk.  Lying down to rest in a neutral-spine position. PROLONGED STANDING WHILE SLIGHTLY LEANING FORWARD  When completing a task that requires you to lean forward while standing in one place for a long time, place either foot up on a stationary 2-4 inch high object to help maintain the best posture. When both feet are on the ground, the low back tends to lose its slight inward curve. If this curve flattens (or becomes too large), then the back and your other joints will experience too much stress, fatigue more quickly and can cause pain.  CORRECT STANDING POSTURES Proper standing posture should be assumed with all daily activities, even if they only take a few moments, like when brushing your teeth. As in sitting, your ears should fall over your shoulders and your shoulders should fall over your hips. You should keep a slight tension in your abdominal  muscles to brace your spine. Your tailbone should point down to the ground, not behind your body, resulting in an over-extended swayback posture.  INCORRECT STANDING POSTURES  Common incorrect standing postures include a forward head, locked knees and/or an excessive swayback. WALKING Walk with an upright posture. Your ears, shoulders and hips should all line-up. PROLONGED ACTIVITY IN A FLEXED POSITION When completing a task that requires you to bend forward  at your waist or lean over a low surface, try to find a way to stabilize 3 of 4 of your limbs. You can place a hand or elbow on your thigh or rest a knee on the surface you are reaching across. This will provide you more stability so that your muscles do not fatigue as quickly. By keeping your knees relaxed, or slightly bent, you will also reduce stress across your low back. CORRECT LIFTING TECHNIQUES DO :   Assume a wide stance. This will provide you more stability and the opportunity to get as close as possible to the object which you are lifting.  Tense your abdominals to brace your spine; then bend at the knees and hips. Keeping your back locked in a neutral-spine position, lift using your leg muscles. Lift with your legs, keeping your back straight.  Test the weight of unknown objects before attempting to lift them.  Try to keep your elbows locked down at your sides in order get the best strength from your shoulders when carrying an object.  Always ask for help when lifting heavy or awkward objects. INCORRECT LIFTING TECHNIQUES DO NOT:   Lock your knees when lifting, even if it is a small object.  Bend and twist. Pivot at your feet or move your feet when needing to change directions.  Assume that you cannot safely pick up a paperclip without proper posture.   This information is not intended to replace advice given to you by your health care provider. Make sure you discuss any questions you have with your health care provider.     Document Released: 03/02/2005 Document Revised: 07/17/2014 Document Reviewed: 06/14/2008 Elsevier Interactive Patient Education 2016 [employer].   Emergency Department Resource Guide 1) Find a Doctor and Pay Out of Pocket Although you won't have to find out who is covered by your insurance plan, it is a good idea to ask around and get recommendations. You will then need to call the office and see if the doctor you have chosen will accept you as a new patient and what types of options they offer for patients who are self-pay. Some doctors offer discounts or will set up payment plans for their patients who do not have insurance, but you will need to ask so you aren't surprised when you get to your appointment.  2) Contact Your Local Health Department Not all health departments have doctors that can see patients for sick visits, but many do, so it is worth a call to see if yours does. If you don't know where your local health department is, you can check in your phone book. The CDC also has a tool to help you locate your state's health department, and many state websites also have listings of all of their local health departments.  3) Find a [facility] Clinic If your illness is not likely to be very severe or complicated, you may want to try a walk in clinic. These are popping up all over the country in pharmacies, drugstores, and shopping centers. They're usually staffed by nurse practitioners or physician assistants that have been trained to treat common illnesses and complaints. They're usually fairly quick and inexpensive. However, if you have serious medical issues or chronic medical problems, these are probably not your best option.  No Primary Care Doctor: - Call [facility] Connect at  [phone] - they can help you locate a primary care doctor that  accepts your insurance, provides certain services, etc. - Physician Referral Service- [phone]  Chronic Pain Problems: Organization          Address  Phone   Notes  [facility]  [phone] Patients need to be referred by their primary care doctor.   Medication Assistance: Organization         Address  Phone   Notes  [facility] Medication [facility] [address] [place], [place] [address] [phone] --Must be a resident of [facility] -- Must have NO insurance coverage whatsoever (no Medicaid/ Medicare, etc.) -- The pt. MUST have a primary care doctor that directs their care regularly and follows them in the community   [facility]  [phone]   [employer]  [phone]    Agencies that provide inexpensive medical care: Organization         Address  Phone   Notes  [place]  [phone]   [facility]    [phone]   [facility] [place], [place] [address] [phone]   [place] [address] [place] [phone]   [facility]    [phone]   [facility]    [phone]   [place] and [address], [place] Phone:  [phone], Fax:  [phone] Hours of Operation:  9 am - 6 pm, M-F.  Also accepts Medicaid/Medicare and self-pay.  [facility] for [place], [address], [place] Phone: [phone], Fax: [phone]. Hours of Operation:  8:30 am - 5:30 pm, M-F.  Also accepts Medicaid and self-pay.  [facility] [address] [place] Phone: [phone]   [place], [place], [place] [phone], Ext. [phone] Mondays & Thursdays: 7-9 AM.  First 15 patients are seen on a first come, first serve basis.    [place] Providers:  Organization         Address  Phone   Notes  [facility] [address], [place] [phone] Also accepts self-pay patients.  [facility] [id number] [place], [place]  [phone]   [place], [address], [place] [phone]   [facility] [address], [place] [phone]   Lucianne Lei [address], [place]   [phone] Only accepts [place] [facility] [place] patients after they have their name applied to their card.   Self-Pay (no insurance) in [facility]:  Organization         Address  Phone   Notes  Sickle Cell Patients, [facility] [place] [phone]   [facility] [place] [phone]   [facility] [place]  [place], [place], [place] [phone]   [facility]/Dr. Osei-Bonsu  [address], [place] or [address], [place] [phone] Phone number for both [place] and [place] locations is the same.  [facility] [address] [place] [phone]   [facility] [address], [place] or [address] [phone]   [facility] [address], [place] [phone], phone; [phone], fax Sees  patients 1st and 3rd Saturday of every month.  Must not qualify for public or private insurance (i.e. Medicaid, Medicare, [place] Health Choice, Veterans' Benefits)  Household income should be no more than 200% of the poverty level The clinic cannot treat you if you are pregnant or think you are pregnant  Sexually transmitted diseases are not treated at the clinic.    Dental Care: Organization         Address  Phone  Notes  [facility] Department of [facility] [phone] Accepts children up to age 47 who are enrolled in [place] or [place]; pregnant women with a Medicaid card; and  children who have applied for Medicaid or [place] Health Choice, but were declined, whose parents can pay a reduced fee at time of service.  [facility] Department of [facility]  [address], [place] [phone] Accepts children up to age 62 who are enrolled in [place] or [place]; pregnant women with a Medicaid card; and children who have applied for Medicaid or [place] Health Choice, but were declined, whose parents can pay a reduced fee at time of service.  [place] Adult Dental Access PROGRAM  [place] [phone] Patients are seen by appointment only. Walk-ins are not accepted. [place] will see patients 3 years of age and older. Monday - Tuesday (8am-5pm) Most Wednesdays (8:30-5pm) $30 per visit, cash only  [facility] Adult [facility] Access PRO[facility]  [address], [facility] [phone] Patients are seen by appointment only. Walk-ins are not accepted. [place] will see patients 50 years of age and older. One Wednesday Evening (Monthly: Volunteer Based).  $30 per visit, cash only  [place]  [phone] for adults; Children under age 46, call Graduate Pediatric Dentistry at [phone]. Children aged 58-14, please call [phone] to request a pediatric application.  Dental services are provided in all areas of dental care including fillings, crowns and bridges, complete and partial dentures, implants, gum treatment, root canals, and extractions. Preventive care is also provided. Treatment is provided to both adults and children. Patients are selected via a lottery and there is often a waiting list.   [facility] [address] [place]  [phone] [email or web address]   [facility] [address] [place], [place] [phone], Ext. [phone] Second and Fourth Thursday of each month, opens at 6:30 AM; Clinic ends at 9 AM.  Patients are seen on a first-come first-served  basis, and a limited number are seen during each clinic.   [facility]  [address] Hillard Danker [place], [place] [phone]   Eligibility Requirements You must have lived in [place], [place], or [place] counties for at least the last three months.   You cannot be eligible for state or federal sponsored [employer], including [employer], [place], or [employer].   You generally cannot be eligible for healthcare insurance through your employer.    How to apply: Eligibility screenings are held every Tuesday and Wednesday afternoon from 1:00 pm until 4:00 pm. You do not need an appointment for the interview!  [facility] [address] [place], [place]   [place]  [place] Department  [place]  [phone]    Behavioral Health Resources in the Community: Intensive Outpatient Programs  Organization         Address  Phone  Notes  [employer] [facility] [address], [place], [place] [phone]   [facility] Outpatient [address], [place], [place]   ADS: [facility] [address] [place], [place]   [place] [address],  [place], [place] or [phone]   Substance Abuse Resources Organization         Address  Phone  Notes  [facility]  [phone]   [place]  [phone]   [facility] [place]   Chinita Pester  [phone]   Residential & Outpatient Substance Abuse Program  [phone]   Psychological Services Organization         Address  Phone  Notes  [facility] [place]  [place]  [phone]   [place] [address] [place] or [phone]    Mobile Crisis Teams Organization          Address  Phone  Notes  [facility], Mobile Crisis Care Unit  [phone]   [facility]  [address] [place], [place]   [place] [address] [place] [phone]    Self-Help/Support Groups Organization         Address  Phone             Notes  [place]. of [place] - variety of support groups  [place] Call for more information  Narcotics Anonymous (NA), [facility] [address], [employer] [place]  2 meetings at this location   Special educational needs teacher         Address  Phone  Notes  [facility] [place],    [place]  [phone]   [facility]  [address] [place] [phone], [place], [place]   [place], [place] [phone] Admissions: 8am-3pm M-F  Incentives Substance [place] [address]    [place], [place] [phone]   [facility] [address] [place], [place], [place]   [facility] [address],  [place], [place]   [facility] - Intensive Outpatient [address] Kristeen Mans [address], [place], [place]   [facility] [place]  [place], [place] [phone] or [phone]   [facility] ([facility]) [address], [place], [place] Accepts Medicaid  [facility] [place] [address],  [place] [phone] Substance Abuse/Addiction Treatment   [facility] Organization         Address  Phone  Notes  [facility]  [phone]   Domenic Schwab, PhD [address] Arlis Porta [place], [place]   [phone] or [phone]   Geneva Delcambre [place], [place] [phone]   [place] [address], [place], [place] [phone] Insurance/Medicaid/sponsorship  through [facility] and Families [address], [place]                                    [place], [place] [phone] [place] [address], [place] [phone]    Dr. Adele Schilder  (  [phone]   [facility] Dept. 1) [address] [place] 2) [place] 3)  [place] [address], [place] [phone]  [phone]   [place] [phone] or [phone] (After Hours)

## 2015-05-01 NOTE — ED Provider Notes (Signed)
CSN: [id number]     Arrival date & time 05/01/15  0827 History   First MD Initiated Contact with Patient 05/01/15 [phone]     Chief Complaint  Patient presents with  . Back Pain   HPI  Jeremiah Keith is a 30 year old male with a past medical history of hypertension and cocaine abuse presenting with back pain. He reports 2 weeks of right-sided lower back pain. He describes the pain as aching, sharp and shooting. He states that when he sits pain radiates down the back of his right thigh towards his knee. The back pain has been constant since onset. 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 He believes this is the source of his lower back pain. He states that lying flat relieves the pain and sitting for long periods of time exacerbates the pain. He prefers to stand as this is more comfortable than sitting. He denies difficulty ambulating. He was seen 9 days ago at [facility] emergency department for the same complaint. He was discharged with Ultram, Robaxin and ibuprofen. He reports no improvement with these treatments. He was supposed to follow up with a primary care clinic after a week but states he has not done this. He denies change in his back pain since last being seen in the emergency department. He denies loss of bowel or bladder control or saddle paresthesias. Denies fevers, chills, abdominal pain, numbness, tingling, loss of sensation or weakness in the bilateral lower extremities. He states he is aware that he has high blood pressure but he refuses to take blood pressure medications.  Past Medical History  Diagnosis Date  . History of chicken pox   . Hypertension   . Depression     after death of family members  . Substance abuse     h/o cocaine abuse   Past Surgical History  Procedure Laterality Date  . Tonsillectomy and adenoidectomy  1990   Family History  Problem Relation Age of Onset  . Heart disease Mother   . Diabetes Father   .  Hypertension Father   . Prostate cancer Maternal Grandfather     possible prostate cancer  . Colon cancer Neg Hx    Social History  Substance Use Topics  . Smoking status: Current Every Day Smoker -- 0.50 packs/day    Types: Cigarettes  . Smokeless tobacco: Never Used  . Alcohol Use: 0.0 oz/week    0 Standard drinks or equivalent per week    Review of Systems  Musculoskeletal: Positive for back pain.  All other systems reviewed and are negative.     Allergies  Penicillins  Home Medications   Prior to Admission medications   Medication Sig Start Date End Date Taking? Authorizing Provider  amLODipine (NORVASC) 5 MG tablet Take 1 tablet (5 mg total) by mouth daily. 06/12/14   [name], MD  cyclobenzaprine (FLEXERIL) 10 MG tablet Take 1 tablet (10 mg total) by mouth 2 (two) times daily as needed for muscle spasms. 05/01/15   Storie Heffern, PA-C  ibuprofen (ADVIL,MOTRIN) 800 MG tablet Take 1 tablet (800 mg total) by mouth every 8 (eight) hours as needed (pain). 04/22/15   Jami L Hagler, PA-C  ibuprofen (ADVIL,MOTRIN) 800 MG tablet Take 1 tablet (800 mg total) by mouth every 8 (eight) hours as needed (pain). 04/22/15   Jami L Hagler, PA-C  methocarbamol (ROBAXIN) 500 MG tablet Take 1 tablet (500 mg total) by mouth 3 (three) times daily. 04/22/15  Jami L Hagler, PA-C  predniSONE (DELTASONE) 50 MG tablet Take one tablet once a day for 5 days 05/01/15   Aerielle Stoklosa, PA-C  traMADol (ULTRAM) 50 MG tablet Take 1 tablet (50 mg total) by mouth every 6 (six) hours as needed. 04/22/15   Jami L Hagler, PA-C   BP 231/122 mmHg  Pulse 65  Temp(Src) 98 F (36.7 C) (Oral)  Resp 18  SpO2 100% Physical Exam  Constitutional: He appears well-developed and well-nourished. No distress.  HENT:  Head: Normocephalic and atraumatic.  Eyes: Conjunctivae are normal. Right eye exhibits no discharge. Left eye exhibits no discharge. No scleral icterus.  Neck: Normal range of motion.  Cardiovascular: Normal  rate and intact distal pulses.   Pedal pulses palpable  Pulmonary/Chest: Effort normal. No respiratory distress.  Musculoskeletal: Normal range of motion.       Lumbar back: He exhibits tenderness. He exhibits normal range of motion, no bony tenderness, no swelling, no deformity and no spasm.       Back:  Generalized tenderness over the right lumbar region. No focal tenderness over the lumbar spine. No muscle spasm noted. Full range of motion of the thoracic and lumbar spine intact. Positive straight leg raise of the right leg. Full range of motion of the lower extremities. Patient ambulates in the emergency department without difficulty.  Neurological: He is alert. Coordination normal.  5/5 strength of the bilateral lower extremities. Sensation to light touch intact throughout.  Skin: Skin is warm and dry. No rash noted.  No rash or other skin changes noted over the lumbar region.  Psychiatric: He has a normal mood and affect. His behavior is normal.  Nursing note and vitals reviewed.   ED Course  Procedures (including critical care time) Labs Review Labs Reviewed - No data to display  Imaging Review No results found. I have personally reviewed and evaluated these images and lab results as part of my medical decision-making.   EKG Interpretation None      MDM   Final diagnoses:  Right-sided low back pain with right-sided sciatica   Patient presenting with back pain x 2 weeks. He was seen in emergency department for same complaint and reports no improvement after receiving Robaxin and Ultram. Patient was to follow-up with his primary care provider but he has not done so. He states that he longer sees Dr. Damita Dunnings and wishes to find a new PCP. No new complaints today. Afebrile. Non-focal neuro exam. Generalized tenderness to the right lumbar region without spasm. Positive straight leg raise on the right. Patient is able to ambulate though with some discomfort. No loss of bowel or  bladder control. No numbness or weakness in the lower extremities. No concern for cauda equina. No history of IVDU or cancer. Conservative therapy including back exercises, heat, ice, tylenol or ibuprofen discussed. Will give short course of prednisone for sciatica. Given resource guide in discharge paperwork and instructed to follow up with a PCP in the next week. Long discussion about his high blood pressure. He states that his blood pressure runs around 220/120. Discussed risk of untreated high blood pressure including stroke and death. Patient acknowledges this information and states he does not wish to take blood pressure medications. Strongly encouraged patient to reconsider and discuss this with his PCP at his follow-up visit. Return precautions discussed and given in discharge paperwork. Pt is stable for discharge.    Lahoma Crocker Brandin Stetzer, PA-C 05/01/15 [id number]  Jeremiah Speak, MD 05/01/15 1259

## 2015-05-03 ENCOUNTER — Ambulatory Visit (INDEPENDENT_AMBULATORY_CARE_PROVIDER_SITE_OTHER): Payer: BLUE CROSS/BLUE SHIELD | Admitting: Family Medicine

## 2015-05-03 ENCOUNTER — Encounter: Payer: Self-pay | Admitting: Family Medicine

## 2015-05-03 VITALS — BP 184/100 | HR 91 | Temp 98.2°F | Wt 245.2 lb

## 2015-05-03 DIAGNOSIS — I10 Essential (primary) hypertension: Secondary | ICD-10-CM

## 2015-05-03 DIAGNOSIS — M5431 Sciatica, right side: Secondary | ICD-10-CM

## 2015-05-03 MED ORDER — TRAMADOL HCL 50 MG PO TABS: 50.0000 mg | ORAL_TABLET | Freq: Four times a day (QID) | ORAL | Status: DC | PRN

## 2015-05-03 MED ORDER — AMLODIPINE BESYLATE 5 MG PO TABS: 5.0000 mg | ORAL_TABLET | Freq: Every day | ORAL | Status: DC

## 2015-05-03 NOTE — Progress Notes (Signed)
Pre visit review using our clinic review tool, if applicable. No additional management support is needed unless otherwise documented below in the visit note.  Back pain.  Course d/w pt.  See at ER.  Didn't fill tramadol or other meds from 1st ER visit.  Back to ER for second visit, started on prednisone and flexeril in the meantime.  Was seen for back pain, sudden onset, woke up and couldn't get out of the bed.  Lower back pain, B. Then R radicular pain, tingling and pain.  No B/B sx.  No fever, no chills, no rash.    Heavy lifting at work.  D/w pt, lifting grave stones etc.  No specific injury that was antecedent, ie he woke up in pain.    He hasn't ice or heat.  He has been trying to stretch.    Had been on BP med, then stopped when ran out.  Never came back for f/u.  No ADE on med prev.  BP was prev controlled on med.  While on med prev had no CP SOB BLE edema.   Meds, vitals, and allergies reviewed.   ROS: See HPI.  Otherwise, noncontributory.  GEN: nad, alert and oriented HEENT: mucous membranes moist NECK: supple w/o LA CV: rrr. PULM: ctab, no inc wob ABD: soft, +bs EXT: no edema SKIN: no acute rash Back ttp in lower midline and R paraspinal muscles w/o bruising or rash.  SLR positive.   Strength grossly wnl BLE o/w.   Some tingling in the R leg along dermatomal pattern but normal o/w.   Able to bear weight, no weakness.

## 2015-05-03 NOTE — Patient Instructions (Addendum)
Keep taking prednisone with food.  Steroid/antiinflammatory med.  Take flexeril as needed- sedation caution.  Muscle relaxer.  Tramadol as needed for pain- sedation caution.   Pain med.  Go to the lab on the way out.  We'll contact you with your lab report. Restart amlodipine.  1 pill a day for BP.  Gently stretch your back.  Try ice on your lower back (or heat, whichever helps more). Update me about your pain and BP next week.  Take care.  Out of work for now.

## 2015-05-04 LAB — BASIC METABOLIC PANEL
BUN: 16 mg/dL (ref 7–25)
CALCIUM: 9.4 mg/dL (ref 8.6–10.3)
CHLORIDE: 101 mmol/L (ref 98–110)
CO2: 31 mmol/L (ref 20–31)
Creat: 1.5 mg/dL — ABNORMAL HIGH (ref 0.60–1.35)
GLUCOSE: 88 mg/dL (ref 65–99)
Potassium: 3.9 mmol/L (ref 3.5–5.3)
SODIUM: 142 mmol/L (ref 135–146)

## 2015-05-05 ENCOUNTER — Other Ambulatory Visit: Payer: Self-pay | Admitting: Family Medicine

## 2015-05-05 DIAGNOSIS — I10 Essential (primary) hypertension: Secondary | ICD-10-CM

## 2015-05-05 DIAGNOSIS — M5430 Sciatica, unspecified side: Secondary | ICD-10-CM | POA: Insufficient documentation

## 2015-05-05 NOTE — Assessment & Plan Note (Signed)
D/w pt . Keep taking prednisone with food. Steroid/antiinflammatory med.  Take flexeril as needed- sedation caution. Muscle relaxer.  Tramadol as needed for pain- sedation caution. Pain med.  Gently stretch his back. Try ice on the lower back (or heat, whichever helps more).  He'll update me about pain and BP next week.  Okay for outpatient f/u.  >25 minutes spent in face to face time with patient, >50% spent in counselling or coordination of care.

## 2015-05-05 NOTE — Assessment & Plan Note (Signed)
Denies recent cocaine use.  See notes on labs.   Restart amlodipine. 1 pill a day for BP.  D/w pt.   He'll update me about pain and BP next week.

## 2015-05-21 ENCOUNTER — Other Ambulatory Visit: Payer: BLUE CROSS/BLUE SHIELD

## 2015-12-08 ENCOUNTER — Emergency Department
Admission: EM | Admit: 2015-12-08 | Discharge: 2015-12-08 | Disposition: A | Discharge status: Home / Self Care | Payer: BLUE CROSS/BLUE SHIELD | Attending: Emergency Medicine | Admitting: Emergency Medicine

## 2015-12-08 ENCOUNTER — Emergency Department: Payer: BLUE CROSS/BLUE SHIELD

## 2015-12-08 DIAGNOSIS — F17210 Nicotine dependence, cigarettes, uncomplicated: Secondary | ICD-10-CM | POA: Insufficient documentation

## 2015-12-08 DIAGNOSIS — I160 Hypertensive urgency: Secondary | ICD-10-CM | POA: Insufficient documentation

## 2015-12-08 DIAGNOSIS — Z7952 Long term (current) use of systemic steroids: Secondary | ICD-10-CM | POA: Insufficient documentation

## 2015-12-08 DIAGNOSIS — Z79899 Other long term (current) drug therapy: Secondary | ICD-10-CM | POA: Insufficient documentation

## 2015-12-08 LAB — BASIC METABOLIC PANEL
Anion gap: 6 (ref 5–15)
BUN: 14 mg/dL (ref 6–20)
CHLORIDE: 103 mmol/L (ref 101–111)
CO2: 29 mmol/L (ref 22–32)
CREATININE: 1.28 mg/dL — AB (ref 0.61–1.24)
Calcium: 9.5 mg/dL (ref 8.9–10.3)
GFR calc Af Amer: >60 mL/min (ref >60)
GFR calc non Af Amer: >60 mL/min (ref >60)
GLUCOSE: 119 mg/dL — AB (ref 65–99)
POTASSIUM: 3.5 mmol/L (ref 3.5–5.1)
SODIUM: 138 mmol/L (ref 135–145)

## 2015-12-08 LAB — TROPONIN I: Troponin I: <0.03 ng/mL (ref <0.03)

## 2015-12-08 LAB — CBC
HEMATOCRIT: 44.6 % (ref 40.0–52.0)
Hemoglobin: 15.8 g/dL (ref 13.0–18.0)
MCH: 30.7 pg (ref 26.0–34.0)
MCHC: 35.3 g/dL (ref 32.0–36.0)
MCV: 86.9 fL (ref 80.0–100.0)
PLATELETS: 207 10*3/uL (ref 150–440)
RBC: 5.13 MIL/uL (ref 4.40–5.90)
RDW: 13.4 % (ref 11.5–14.5)
WBC: 10.2 10*3/uL (ref 3.8–10.6)

## 2015-12-08 LAB — URINALYSIS COMPLETE WITH MICROSCOPIC (ARMC ONLY)
BACTERIA UA: NONE SEEN
Bilirubin Urine: NEGATIVE
GLUCOSE, UA: NEGATIVE mg/dL
Hgb urine dipstick: NEGATIVE
Leukocytes, UA: NEGATIVE
NITRITE: NEGATIVE
PROTEIN: 30 mg/dL — AB
SPECIFIC GRAVITY, URINE: 1.014 (ref 1.005–1.030)
SQUAMOUS EPITHELIAL / LPF: NONE SEEN
pH: 7 (ref 5.0–8.0)

## 2015-12-08 MED ORDER — PROCHLORPERAZINE EDISYLATE 5 MG/ML IJ SOLN
10.0000 mg | Freq: Once | INTRAMUSCULAR | Status: AC
  Administered 2015-12-08: 10 mg via INTRAVENOUS
  Filled 2015-12-08: qty 2

## 2015-12-08 MED ORDER — HYDRALAZINE HCL 20 MG/ML IJ SOLN
2.0000 mg | Freq: Once | INTRAMUSCULAR | Status: DC
  Filled 2015-12-08: qty 1

## 2015-12-08 MED ORDER — HYDRALAZINE HCL 20 MG/ML IJ SOLN: 10.0000 mg | Freq: Once | INTRAMUSCULAR | Status: DC

## 2015-12-08 MED ORDER — HYDRALAZINE HCL 20 MG/ML IJ SOLN
10.0000 mg | Freq: Once | INTRAMUSCULAR | Status: DC
Start: 2015-12-08 — End: 2015-12-08

## 2015-12-08 MED ORDER — METOPROLOL TARTRATE 5 MG/5ML IV SOLN
10.0000 mg | Freq: Once | INTRAVENOUS | Status: AC
  Administered 2015-12-08: 10 mg via INTRAVENOUS
  Filled 2015-12-08: qty 10

## 2015-12-08 MED ORDER — DIPHENHYDRAMINE HCL 50 MG/ML IJ SOLN
12.5000 mg | Freq: Once | INTRAMUSCULAR | Status: AC
  Administered 2015-12-08: 12.5 mg via INTRAVENOUS
  Filled 2015-12-08: qty 1

## 2015-12-08 MED ORDER — SODIUM CHLORIDE 0.9 % IV SOLN
Freq: Once | INTRAVENOUS | Status: AC
  Administered 2015-12-08: 16:00:00 via INTRAVENOUS

## 2015-12-08 NOTE — ED Triage Notes (Signed)
Pt reports chest pain and migraine for the past 2 days nausea and vomiting that started today.

## 2015-12-08 NOTE — ED Notes (Signed)
Tech Alissa called RN, pt requesting update on admit status.  RN to room. Pt reports he wants to leave AMA. RN advised pt that his BP and vitals were at unsafe level to leave. Pt was asked to stay and speak to MD. Pt refused. Pt removed his on IV and took himself off the monitor. Pt was asked to sign AMA form. Pt refused to sign. PT walked out wearing hospital gown.
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
## 2015-12-08 NOTE — ED Provider Notes (Addendum)
[facility] Provider Note   CSN: [id number] Arrival date & time: 12/08/15  1432     History   Chief Complaint Chief Complaint  Patient presents with  . Chest Pain    HPI JOBAN COLLEDGE is a 30 y.o. male.Who complains of bad headache like his migraines but much worse than usual with nausea and vomiting and nausea and vomiting started today the headache for the last 2 days patient said he had about 10 minutes of chest pain today that was sharp and stabbing. Patient says the headache is throbbing. Patient was given Compazine and Benadryl IV with fluids with resulting marked improvement in the headache. Patient's blood pressure however still remained very elevated as it was when he came in the hospital. He then got Lopressor IV blood pressure decreased minimally but his heart rate would go from 70 down to 55 and back up to 70 and then later went down from 70-48 back up to 70 and did not want to give him any further beta blocker. And put him up for admission.  HPI  Past Medical History:  Diagnosis Date  . Depression    after death of family members  . History of chicken pox   . Hypertension   . Substance abuse    h/o cocaine abuse    Patient Active Problem List   Diagnosis Date Noted  . Sciatica 05/05/2015  . History of pneumonia 06/13/2014  . HTN (hypertension) 06/13/2014    Past Surgical History:  Procedure Laterality Date  . TONSILLECTOMY AND ADENOIDECTOMY  1990       Home Medications    Prior to Admission medications   Medication Sig Start Date End Date Taking? Authorizing Provider  amLODipine (NORVASC) 5 MG tablet Take 1 tablet (5 mg total) by mouth daily. 05/03/15   [name], MD  cyclobenzaprine (FLEXERIL) 10 MG tablet Take 1 tablet (10 mg total) by mouth 2 (two) times daily as needed for muscle spasms. 05/01/15   Stevi Barrett, PA-C  predniSONE (DELTASONE) 50 MG tablet Take one tablet once a day for 5 days 05/01/15   Stevi Barrett, PA-C    traMADol (ULTRAM) 50 MG tablet Take 1 tablet (50 mg total) by mouth every 6 (six) hours as needed. 05/03/15   [name], MD    Family History Family History  Problem Relation Age of Onset  . Heart disease Mother   . Diabetes Father   . Hypertension Father   . Prostate cancer Maternal Grandfather     possible prostate cancer  . Colon cancer Neg Hx     Social History Social History  Substance Use Topics  . Smoking status: Current Every Day Smoker    Packs/day: 0.50    Types: Cigarettes  . Smokeless tobacco: Never Used  . Alcohol use 0.0 oz/week     Allergies   Penicillins   Review of Systems Review of Systems   Physical Exam Updated Vital Signs BP (!) 223/125 (BP Location: Right Arm)   Pulse (!) 56   Temp 97.6 F (36.4 C) (Oral)   Resp 12   Ht 5\' 7"  (1.702 m)   Wt 248 lb (112.5 kg)   SpO2 96%   BMI 38.84 kg/m   Physical Exam   ED Treatments / Results  Labs (all labs ordered are listed, but only abnormal results are displayed) Labs Reviewed  BASIC METABOLIC PANEL - Abnormal; Notable for the following:       Result Value  Glucose, Bld 119 (*)    Creatinine, Ser 1.28 (*)    All other components within normal limits  URINALYSIS COMPLETEWITH MICROSCOPIC ([facility] ONLY) - Abnormal; Notable for the following:    Color, Urine YELLOW (*)    APPearance CLEAR (*)    Ketones, ur TRACE (*)    Protein, ur 30 (*)    All other components within normal limits  CBC  TROPONIN I    EKG  EKG Interpretation None       Radiology Dg Chest 2 View  Result Date: 12/08/2015 CLINICAL DATA:  Pt to ED c/o chest pain and migraine for the past 2 days, states chest pain has currently resolved at time of exam; denies any known heart or lung conditions EXAM: CHEST  2 VIEW COMPARISON:  06/12/2014 FINDINGS: Midline trachea. Borderline cardiomegaly. Mediastinal contours otherwise within normal limits. No pleural effusion or pneumothorax. Clear lungs. IMPRESSION: No active  cardiopulmonary disease. Electronically Signed   By: Abigail Miyamoto M.D.   On: 12/08/2015 15:34   Ct Head Wo Contrast  Result Date: 12/08/2015 CLINICAL DATA:  Headache x4 days EXAM: CT HEAD WITHOUT CONTRAST TECHNIQUE: Contiguous axial images were obtained from the base of the skull through the vertex without intravenous contrast. COMPARISON:  10/08/2012 FINDINGS: Brain: No evidence of acute infarction, hemorrhage, hydrocephalus, extra-axial collection or mass lesion/mass effect. Vascular: No hyperdense vessel or unexpected calcification. Skull: Normal. Negative for fracture or focal lesion. Sinuses/Orbits: The visualized paranasal sinuses are essentially clear. The mastoid air cells are unopacified. Other: Cerebral volume is within normal limits. No ventriculomegaly. IMPRESSION: Normal head CT. Electronically Signed   By: Julian Hy M.D.   On: 12/08/2015 16:04   CLINICAL DATA:  Pt to ED c/o chest pain and migraine for the past 2 days, states chest pain has currently resolved at time of exam; denies any known heart or lung conditions  EXAM: CHEST  2 VIEW  COMPARISON:  06/12/2014  FINDINGS: Midline trachea. Borderline cardiomegaly. Mediastinal contours otherwise within normal limits. No pleural effusion or pneumothorax. Clear lungs.  IMPRESSION: No active cardiopulmonary disease.   Electronically Signed   By: Abigail Miyamoto M.D.   On: 12/08/2015 15:34  Procedures Procedures (including critical care time)  Medications Ordered in ED Medications  hydrALAZINE (APRESOLINE) injection 2 mg (not administered)  0.9 %  sodium chloride infusion ( Intravenous Stopped 12/08/15 1821)  prochlorperazine (COMPAZINE) injection 10 mg (10 mg Intravenous Given 12/08/15 1624)  diphenhydrAMINE (BENADRYL) injection 12.5 mg (12.5 mg Intravenous Given 12/08/15 1624)  metoprolol (LOPRESSOR) injection 10 mg (10 mg Intravenous Given 12/08/15 1730)     Initial Impression / Assessment and Plan / ED  Course  I have reviewed the triage vital signs and the nursing notes.  Pertinent labs & imaging results that were available during my care of the patient were reviewed by me and considered in my medical decision making (see chart for details).  Clinical Course   I controlled patient's headache with Compazine and Benadryl and got better but his blood pressure did not change. I gave him some Lopressor and his heart rate has gone down to a low of 30 year old come back up again and go back down again ranging from 48-71 to give him anymore Lopressor blood pressure really did not decrease much. I been waiting for the hospitalist who is been very busy to see him she calls me just now at 8:30 and asked me to give him some hydralazine to see if that blood makes his blood  pressure go down. Hydralazine in up-to-date says use and hypertensive urgencies is generally not recommended due to prolonged and unpredictable effects. Because of this I will only give him 2 mg IV. I feel it is important to get the patient blood pressure down below 200 and keep him in the hospital at least overnight to make sure that his pressure does not skyrocket once he leaves the hospital again. I talked to Dr. Archie Balboa and the admissions people about him and all of [place] agree with this idea.   Final Clinical Impressions(s) / ED Diagnoses   Final diagnoses:  Hypertensive urgency    New Prescriptions New Prescriptions   No medications on file     Nena Polio, MD 12/08/15 2044    Nena Polio, MD 12/08/15 2156

## 2017-11-01 ENCOUNTER — Encounter: Payer: Self-pay | Admitting: Emergency Medicine

## 2017-11-01 ENCOUNTER — Emergency Department: Payer: Self-pay

## 2017-11-01 ENCOUNTER — Inpatient Hospital Stay (HOSPITAL_COMMUNITY)
Admit: 2017-11-01 | Discharge: 2017-11-01 | Disposition: A | Discharge status: Home / Self Care | Payer: Self-pay | Attending: Internal Medicine | Admitting: Internal Medicine

## 2017-11-01 ENCOUNTER — Other Ambulatory Visit: Payer: Self-pay

## 2017-11-01 ENCOUNTER — Inpatient Hospital Stay: Payer: Self-pay

## 2017-11-01 ENCOUNTER — Inpatient Hospital Stay
Admission: EM | Admit: 2017-11-01 | Discharge: 2017-11-06 | DRG: 304 | Disposition: A | Discharge status: Home / Self Care | Payer: Self-pay | Attending: Internal Medicine | Admitting: Internal Medicine

## 2017-11-01 DIAGNOSIS — I249 Acute ischemic heart disease, unspecified: Secondary | ICD-10-CM

## 2017-11-01 DIAGNOSIS — I129 Hypertensive chronic kidney disease with stage 1 through stage 4 chronic kidney disease, or unspecified chronic kidney disease: Secondary | ICD-10-CM | POA: Diagnosis present

## 2017-11-01 DIAGNOSIS — R0602 Shortness of breath: Secondary | ICD-10-CM

## 2017-11-01 DIAGNOSIS — R809 Proteinuria, unspecified: Secondary | ICD-10-CM | POA: Diagnosis present

## 2017-11-01 DIAGNOSIS — R0600 Dyspnea, unspecified: Secondary | ICD-10-CM

## 2017-11-01 DIAGNOSIS — F17210 Nicotine dependence, cigarettes, uncomplicated: Secondary | ICD-10-CM | POA: Diagnosis present

## 2017-11-01 DIAGNOSIS — E876 Hypokalemia: Secondary | ICD-10-CM | POA: Diagnosis present

## 2017-11-01 DIAGNOSIS — I161 Hypertensive emergency: Principal | ICD-10-CM | POA: Diagnosis present

## 2017-11-01 DIAGNOSIS — I452 Bifascicular block: Secondary | ICD-10-CM | POA: Diagnosis present

## 2017-11-01 DIAGNOSIS — J810 Acute pulmonary edema: Secondary | ICD-10-CM

## 2017-11-01 DIAGNOSIS — D638 Anemia in other chronic diseases classified elsewhere: Secondary | ICD-10-CM | POA: Diagnosis present

## 2017-11-01 DIAGNOSIS — N179 Acute kidney failure, unspecified: Secondary | ICD-10-CM

## 2017-11-01 DIAGNOSIS — D72829 Elevated white blood cell count, unspecified: Secondary | ICD-10-CM | POA: Diagnosis present

## 2017-11-01 DIAGNOSIS — I248 Other forms of acute ischemic heart disease: Secondary | ICD-10-CM | POA: Diagnosis present

## 2017-11-01 DIAGNOSIS — R739 Hyperglycemia, unspecified: Secondary | ICD-10-CM | POA: Diagnosis present

## 2017-11-01 DIAGNOSIS — R0603 Acute respiratory distress: Secondary | ICD-10-CM | POA: Diagnosis present

## 2017-11-01 DIAGNOSIS — Z880 Allergy status to penicillin: Secondary | ICD-10-CM

## 2017-11-01 DIAGNOSIS — I2489 Other forms of acute ischemic heart disease: Secondary | ICD-10-CM

## 2017-11-01 DIAGNOSIS — N182 Chronic kidney disease, stage 2 (mild): Secondary | ICD-10-CM | POA: Diagnosis present

## 2017-11-01 LAB — BLOOD GAS, VENOUS
ACID-BASE EXCESS: 4.1 mmol/L — AB (ref 0.0–2.0)
BICARBONATE: 28.5 mmol/L — AB (ref 20.0–28.0)
O2 Saturation: 97.1 %
PATIENT TEMPERATURE: 37
pCO2, Ven: 41 mmHg — ABNORMAL LOW (ref 44.0–60.0)
pH, Ven: 7.45 — ABNORMAL HIGH (ref 7.250–7.430)
pO2, Ven: 87 mmHg — ABNORMAL HIGH (ref 32.0–45.0)

## 2017-11-01 LAB — NA AND K (SODIUM & POTASSIUM), RAND UR
POTASSIUM UR: 12 mmol/L
Sodium, Ur: 98 mmol/L

## 2017-11-01 LAB — URINE DRUG SCREEN, QUALITATIVE (ARMC ONLY)
Amphetamines, Ur Screen: NOT DETECTED
BARBITURATES, UR SCREEN: NOT DETECTED
CANNABINOID 50 NG, UR ~~LOC~~: NOT DETECTED
Cocaine Metabolite,Ur ~~LOC~~: NOT DETECTED
MDMA (Ecstasy)Ur Screen: NOT DETECTED
METHADONE SCREEN, URINE: NOT DETECTED
Opiate, Ur Screen: NOT DETECTED
PHENCYCLIDINE (PCP) UR S: NOT DETECTED
Tricyclic, Ur Screen: NOT DETECTED

## 2017-11-01 LAB — PROTEIN / CREATININE RATIO, URINE
Creatinine, Urine: 16 mg/dL
PROTEIN CREATININE RATIO: 0.56 mg/mg{creat} — AB (ref 0.00–0.15)
Total Protein, Urine: 9 mg/dL

## 2017-11-01 LAB — ECHOCARDIOGRAM COMPLETE
Height: 66 in
Weight: 3600 oz

## 2017-11-01 LAB — URINALYSIS, COMPLETE (UACMP) WITH MICROSCOPIC
Bacteria, UA: NONE SEEN
Bilirubin Urine: NEGATIVE
GLUCOSE, UA: 50 mg/dL — AB
Hgb urine dipstick: NEGATIVE
Ketones, ur: NEGATIVE mg/dL
LEUKOCYTES UA: NEGATIVE
Nitrite: NEGATIVE
PH: 6 (ref 5.0–8.0)
Protein, ur: NEGATIVE mg/dL
SPECIFIC GRAVITY, URINE: 1.005 (ref 1.005–1.030)
SQUAMOUS EPITHELIAL / LPF: NONE SEEN (ref 0–5)

## 2017-11-01 LAB — CBC
HEMATOCRIT: 35.2 % — AB (ref 40.0–52.0)
Hemoglobin: 12.6 g/dL — ABNORMAL LOW (ref 13.0–18.0)
MCH: 31.2 pg (ref 26.0–34.0)
MCHC: 35.9 g/dL (ref 32.0–36.0)
MCV: 86.8 fL (ref 80.0–100.0)
Platelets: 217 10*3/uL (ref 150–440)
RBC: 4.06 MIL/uL — ABNORMAL LOW (ref 4.40–5.90)
RDW: 13.6 % (ref 11.5–14.5)
WBC: 14.8 10*3/uL — ABNORMAL HIGH (ref 3.8–10.6)

## 2017-11-01 LAB — BASIC METABOLIC PANEL
Anion gap: 9 (ref 5–15)
BUN: 36 mg/dL — ABNORMAL HIGH (ref 6–20)
CO2: 29 mmol/L (ref 22–32)
CREATININE: 3.16 mg/dL — AB (ref 0.61–1.24)
Calcium: 9 mg/dL (ref 8.9–10.3)
Chloride: 101 mmol/L (ref 98–111)
GFR calc Af Amer: 28 mL/min — ABNORMAL LOW (ref >60)
GFR calc non Af Amer: 24 mL/min — ABNORMAL LOW (ref >60)
GLUCOSE: 130 mg/dL — AB (ref 70–99)
Potassium: 3 mmol/L — ABNORMAL LOW (ref 3.5–5.1)
Sodium: 139 mmol/L (ref 135–145)

## 2017-11-01 LAB — GLUCOSE, CAPILLARY: GLUCOSE-CAPILLARY: 192 mg/dL — AB (ref 70–99)

## 2017-11-01 LAB — TROPONIN I
Troponin I: 0.19 ng/mL (ref <0.03)
Troponin I: 0.22 ng/mL (ref <0.03)
Troponin I: 0.17 ng/mL (ref <0.03)

## 2017-11-01 LAB — PROTIME-INR
INR: 1.14
Prothrombin Time: 14.5 seconds (ref 11.4–15.2)

## 2017-11-01 LAB — APTT
aPTT: >160 seconds (ref 24–36)
aPTT: 61 seconds — ABNORMAL HIGH (ref 24–36)

## 2017-11-01 LAB — HEPARIN LEVEL (UNFRACTIONATED)
HEPARIN UNFRACTIONATED: 0.89 [IU]/mL — AB (ref 0.30–0.70)
HEPARIN UNFRACTIONATED: 0.18 [IU]/mL — AB (ref 0.30–0.70)
Heparin Unfractionated: 0.31 IU/mL (ref 0.30–0.70)

## 2017-11-01 LAB — PHOSPHORUS: Phosphorus: 1.5 mg/dL — ABNORMAL LOW (ref 2.5–4.6)

## 2017-11-01 LAB — BRAIN NATRIURETIC PEPTIDE: B Natriuretic Peptide: 1384 pg/mL — ABNORMAL HIGH (ref 0.0–100.0)

## 2017-11-01 LAB — MAGNESIUM: MAGNESIUM: 2.2 mg/dL (ref 1.7–2.4)

## 2017-11-01 LAB — CREATININE, URINE, RANDOM: CREATININE, URINE: 17 mg/dL

## 2017-11-01 LAB — PROTEIN, URINE, RANDOM: TOTAL PROTEIN, URINE: 8 mg/dL

## 2017-11-01 MED ORDER — BISACODYL 5 MG PO TBEC
5.0000 mg | DELAYED_RELEASE_TABLET | Freq: Every day | ORAL | Status: DC | PRN
Start: 2017-11-01 — End: 2017-11-06

## 2017-11-01 MED ORDER — METHYLPREDNISOLONE SODIUM SUCC 125 MG IJ SOLR
125.0000 mg | Freq: Once | INTRAMUSCULAR | Status: AC
  Administered 2017-11-01: 125 mg via INTRAVENOUS

## 2017-11-01 MED ORDER — ASPIRIN 81 MG PO CHEW
81.0000 mg | CHEWABLE_TABLET | Freq: Every day | ORAL | Status: DC
  Administered 2017-11-01 – 2017-11-06 (×6): 81 mg via ORAL
  Filled 2017-11-01 (×6): qty 1

## 2017-11-01 MED ORDER — MORPHINE SULFATE (PF) 2 MG/ML IV SOLN
2.0000 mg | Freq: Once | INTRAVENOUS | Status: AC
  Administered 2017-11-01: 2 mg via INTRAVENOUS

## 2017-11-01 MED ORDER — SENNOSIDES-DOCUSATE SODIUM 8.6-50 MG PO TABS: 1.0000 | ORAL_TABLET | Freq: Every evening | ORAL | Status: DC | PRN

## 2017-11-01 MED ORDER — IPRATROPIUM-ALBUTEROL 0.5-2.5 (3) MG/3ML IN SOLN
9.0000 mL | Freq: Once | RESPIRATORY_TRACT | Status: AC
  Administered 2017-11-01: 9 mL via RESPIRATORY_TRACT

## 2017-11-01 MED ORDER — METOPROLOL TARTRATE 5 MG/5ML IV SOLN
INTRAVENOUS | Status: AC
  Administered 2017-11-01: 5 mg via INTRAVENOUS
  Filled 2017-11-01: qty 5

## 2017-11-01 MED ORDER — POTASSIUM CHLORIDE CRYS ER 20 MEQ PO TBCR
40.0000 meq | EXTENDED_RELEASE_TABLET | Freq: Once | ORAL | Status: AC
  Administered 2017-11-01: 40 meq via ORAL
  Filled 2017-11-01: qty 2

## 2017-11-01 MED ORDER — NITROGLYCERIN IN D5W 200-5 MCG/ML-% IV SOLN
0.0000 ug/min | Freq: Once | INTRAVENOUS | Status: AC
  Administered 2017-11-01: 10 ug/min via INTRAVENOUS
  Filled 2017-11-01: qty 250

## 2017-11-01 MED ORDER — METHYLPREDNISOLONE SODIUM SUCC 125 MG IJ SOLR
INTRAMUSCULAR | Status: AC
  Filled 2017-11-01: qty 2

## 2017-11-01 MED ORDER — ACETAMINOPHEN 650 MG RE SUPP: 650.0000 mg | Freq: Four times a day (QID) | RECTAL | Status: DC | PRN

## 2017-11-01 MED ORDER — CARVEDILOL 12.5 MG PO TABS: 12.5000 mg | ORAL_TABLET | Freq: Two times a day (BID) | ORAL | Status: DC

## 2017-11-01 MED ORDER — ACETAMINOPHEN 325 MG PO TABS
650.0000 mg | ORAL_TABLET | Freq: Four times a day (QID) | ORAL | Status: DC | PRN
  Administered 2017-11-03: 650 mg via ORAL
  Filled 2017-11-01: qty 2

## 2017-11-01 MED ORDER — NICARDIPINE HCL IN NACL 20-0.86 MG/200ML-% IV SOLN
3.0000 mg/h | Freq: Once | INTRAVENOUS | Status: DC
  Filled 2017-11-01: qty 200

## 2017-11-01 MED ORDER — HEPARIN BOLUS VIA INFUSION
2000.0000 [IU] | Freq: Once | INTRAVENOUS | Status: AC
  Administered 2017-11-01: 2000 [IU] via INTRAVENOUS
  Filled 2017-11-01: qty 2000

## 2017-11-01 MED ORDER — NITROGLYCERIN IN D5W 200-5 MCG/ML-% IV SOLN
0.0000 ug/min | INTRAVENOUS | Status: DC
  Administered 2017-11-01: 130 ug/min via INTRAVENOUS
  Administered 2017-11-02: 155 ug/min via INTRAVENOUS
  Filled 2017-11-01 (×3): qty 250

## 2017-11-01 MED ORDER — HEPARIN BOLUS VIA INFUSION
4000.0000 [IU] | Freq: Once | INTRAVENOUS | Status: AC
  Administered 2017-11-01: 4000 [IU] via INTRAVENOUS
  Filled 2017-11-01: qty 4000

## 2017-11-01 MED ORDER — FUROSEMIDE 10 MG/ML IJ SOLN
40.0000 mg | Freq: Once | INTRAMUSCULAR | Status: AC
  Administered 2017-11-01: 40 mg via INTRAVENOUS
  Filled 2017-11-01: qty 4

## 2017-11-01 MED ORDER — NICARDIPINE HCL IN NACL 20-0.86 MG/200ML-% IV SOLN
3.0000 mg/h | INTRAVENOUS | Status: AC
  Filled 2017-11-01: qty 200

## 2017-11-01 MED ORDER — NICARDIPINE HCL IN NACL 20-0.86 MG/200ML-% IV SOLN
3.0000 mg/h | INTRAVENOUS | Status: DC
  Administered 2017-11-01: 7.5 mg/h via INTRAVENOUS
  Administered 2017-11-01: 15 mg/h via INTRAVENOUS
  Filled 2017-11-01 (×2): qty 200

## 2017-11-01 MED ORDER — ONDANSETRON HCL 4 MG/2ML IJ SOLN
4.0000 mg | Freq: Four times a day (QID) | INTRAMUSCULAR | Status: DC | PRN
  Administered 2017-11-01 – 2017-11-04 (×2): 4 mg via INTRAVENOUS
  Filled 2017-11-01 (×2): qty 2

## 2017-11-01 MED ORDER — AMLODIPINE BESYLATE 10 MG PO TABS
10.0000 mg | ORAL_TABLET | Freq: Every day | ORAL | Status: DC
  Administered 2017-11-01 – 2017-11-06 (×6): 10 mg via ORAL
  Filled 2017-11-01 (×6): qty 1

## 2017-11-01 MED ORDER — HEPARIN (PORCINE) IN NACL 100-0.45 UNIT/ML-% IJ SOLN
1300.0000 [IU]/h | INTRAMUSCULAR | Status: DC
  Administered 2017-11-01: 1300 [IU]/h via INTRAVENOUS
  Administered 2017-11-01: 1050 [IU]/h via INTRAVENOUS
  Filled 2017-11-01 (×2): qty 250

## 2017-11-01 MED ORDER — NICARDIPINE HCL IN NACL 40-0.83 MG/200ML-% IV SOLN
3.0000 mg/h | INTRAVENOUS | Status: DC
  Filled 2017-11-01 (×2): qty 200

## 2017-11-01 MED ORDER — METOPROLOL TARTRATE 5 MG/5ML IV SOLN
5.0000 mg | Freq: Once | INTRAVENOUS | Status: AC
  Administered 2017-11-01: 5 mg via INTRAVENOUS

## 2017-11-01 MED ORDER — NITROGLYCERIN 2 % TD OINT
1.0000 [in_us] | TOPICAL_OINTMENT | Freq: Once | TRANSDERMAL | Status: AC
  Administered 2017-11-01: 1 [in_us] via TOPICAL
  Filled 2017-11-01: qty 1

## 2017-11-01 MED ORDER — LABETALOL HCL 100 MG PO TABS
100.0000 mg | ORAL_TABLET | Freq: Two times a day (BID) | ORAL | Status: DC
  Administered 2017-11-01 – 2017-11-02 (×3): 100 mg via ORAL
  Filled 2017-11-01 (×6): qty 1

## 2017-11-01 MED ORDER — IPRATROPIUM-ALBUTEROL 0.5-2.5 (3) MG/3ML IN SOLN
RESPIRATORY_TRACT | Status: AC
  Filled 2017-11-01: qty 9

## 2017-11-01 MED ORDER — ONDANSETRON HCL 4 MG PO TABS: 4.0000 mg | ORAL_TABLET | Freq: Four times a day (QID) | ORAL | Status: DC | PRN

## 2017-11-01 MED ORDER — NITROGLYCERIN IN D5W 200-5 MCG/ML-% IV SOLN
INTRAVENOUS | Status: AC
  Administered 2017-11-01: 130 ug/min via INTRAVENOUS
  Filled 2017-11-01: qty 250

## 2017-11-01 MED ORDER — MORPHINE SULFATE (PF) 2 MG/ML IV SOLN
INTRAVENOUS | Status: AC
  Administered 2017-11-01: 2 mg via INTRAVENOUS
  Filled 2017-11-01: qty 1

## 2017-11-01 MED ORDER — LABETALOL HCL 100 MG PO TABS: 100.0000 mg | ORAL_TABLET | Freq: Two times a day (BID) | ORAL | Status: DC

## 2017-11-01 MED ORDER — CARVEDILOL 6.25 MG PO TABS: 12.5000 mg | ORAL_TABLET | Freq: Two times a day (BID) | ORAL | Status: DC

## 2017-11-01 MED ORDER — K PHOS MONO-SOD PHOS DI & MONO 155-852-130 MG PO TABS
500.0000 mg | ORAL_TABLET | ORAL | Status: AC
  Administered 2017-11-01 (×3): 500 mg via ORAL
  Filled 2017-11-01 (×4): qty 2

## 2017-11-01 NOTE — ED Provider Notes (Signed)
[facility] Emergency Department Provider Note   ____________________________________________   First MD Initiated Contact with Patient 11/01/17 [phone]     (approximate)  I have reviewed the triage vital signs and the nursing notes.   HISTORY  Chief Complaint Respiratory Distress    HPI Jeremiah Keith is a 32 y.o. male who comes into the hospital today with some chest tightness and shortness of breath.  He states that the symptoms started 3 days ago.  He has been having a very difficult time but he reports that the symptoms have been getting worse and worse.  He thinks that his home was infested with mold and this was causing his symptoms.  The patient states that he has been having to sleep on a recliner but he could not sleep tonight.  He denies any fevers and is only had a mild cough.  He states that he cannot get a deep breath.  He denies nausea vomiting or sick contacts.  He rates his discomfort a 10 out of 10 in intensity. He is here tonight for evaluation and treatment.   Past Medical History:  Diagnosis Date  . Depression    after death of family members  . History of chicken pox   . Hypertension   . Substance abuse [facility])    h/o cocaine abuse    Patient Active Problem List   Diagnosis Date Noted  . Hypertensive emergency 11/01/2017  . Sciatica 05/05/2015  . History of pneumonia 06/13/2014  . HTN (hypertension) 06/13/2014    Past Surgical History:  Procedure Laterality Date  . TONSILLECTOMY AND ADENOIDECTOMY  1990    Prior to Admission medications   Not on File    Allergies Penicillins  Family History  Problem Relation Age of Onset  . Heart disease Mother   . Diabetes Father   . Hypertension Father   . Prostate cancer Maternal Grandfather        possible prostate cancer  . Colon cancer Neg Hx     Social History Social History   Tobacco Use  . Smoking status: Current Every Day Smoker    Packs/day: 25.00    Types:  Cigarettes  . Smokeless tobacco: Never Used  Substance Use Topics  . Alcohol use: Yes    Alcohol/week: 0.0 standard drinks  . Drug use: Not Currently    Review of Systems  Constitutional: No fever/chills Eyes: No visual changes. ENT: No sore throat. Cardiovascular:  chest pain. Respiratory:  shortness of breath. Gastrointestinal: No abdominal pain.  No nausea, no vomiting.  No diarrhea.  No constipation. Genitourinary: Negative for dysuria. Musculoskeletal: Negative for back pain. Skin: Negative for rash. Neurological: Negative for headaches, focal weakness or numbness.   ____________________________________________   PHYSICAL EXAM:  VITAL SIGNS: ED Triage Vitals  Enc Vitals Group     BP 11/01/17 0344 (!) 249/157     Pulse Rate 11/01/17 0344 (!) 110     Resp 11/01/17 0344 (!) 29     Temp --      Temp src --      SpO2 11/01/17 0344 91 %     Weight 11/01/17 0349 225 lb (102.1 kg)     Height 11/01/17 0349 5\' 6"  (1.676 m)     Head Circumference --      Peak Flow --      Pain Score 11/01/17 0343 10     Pain Loc --      Pain Edu? --  Excl. in [place]? --     Constitutional: Alert and oriented.  The patient appears to be in some acute respiratory distress Eyes: Conjunctivae are normal. PERRL. EOMI. Head: Atraumatic. Nose: No congestion/rhinnorhea. Mouth/Throat: Mucous membranes are moist.  Oropharynx non-erythematous. Cardiovascular: Tachycardia, regular rhythm. Grossly normal heart sounds.  Good peripheral circulation. Respiratory: Increased respiratory effort with tachypnea diminished breath sounds in all lung fields with some tight wheezes.   Gastrointestinal: Soft and nontender. No distention.  Musculoskeletal: No lower extremity tenderness nor edema.   Neurologic:  Normal speech and language.  Skin:  Skin is warm, dry and intact. Marland Kitchen Psychiatric: Mood and affect are normal.   ____________________________________________   LABS (all labs ordered are listed, but  only abnormal results are displayed)  Labs Reviewed  CBC - Abnormal; Notable for the following components:      Result Value   WBC 14.8 (*)    RBC 4.06 (*)    Hemoglobin 12.6 (*)    HCT 35.2 (*)    All other components within normal limits  BASIC METABOLIC PANEL - Abnormal; Notable for the following components:   Potassium 3.0 (*)    Glucose, Bld 130 (*)    BUN 36 (*)    Creatinine, Ser 3.16 (*)    GFR calc non Af Amer 24 (*)    GFR calc Af Amer 28 (*)    All other components within normal limits  TROPONIN I - Abnormal; Notable for the following components:   Troponin I 0.19 (*)    All other components within normal limits  BRAIN NATRIURETIC PEPTIDE - Abnormal; Notable for the following components:   B Natriuretic Peptide 1,384.0 (*)    All other components within normal limits  BLOOD GAS, VENOUS - Abnormal; Notable for the following components:   pH, Ven 7.45 (*)    pCO2, Ven 41 (*)    pO2, Ven 87.0 (*)    Bicarbonate 28.5 (*)    Acid-Base Excess 4.1 (*)    All other components within normal limits  TROPONIN I  TROPONIN I  MAGNESIUM  PHOSPHORUS  CREATININE, URINE, RANDOM  UREA NITROGEN, URINE  PROTEIN, URINE, RANDOM  NA AND K (SODIUM & POTASSIUM), RAND UR  PROTIME-INR  APTT  HEPARIN LEVEL (UNFRACTIONATED)  HEPARIN LEVEL (UNFRACTIONATED)   ____________________________________________  EKG  ED ECG REPORT I, Loney Hering, the attending physician, personally viewed and interpreted this ECG.   Date: 11/01/2017  EKG Time: 346  Rate: 112  Rhythm: sinus tachycardia  Axis: right axis deviation  Intervals:right bundle branch block and left posterior fascicular block  ST&T Change: ST depression in lead aVF and V3  ____________________________________________  RADIOLOGY  ED MD interpretation:  CXR: Cardiac enlargement with pulmonary vascular congestion and mild interstitial edema. Possible small left pleural effusion.  Official radiology report(s): Dg  Chest Portable 1 View  Result Date: 11/01/2017 CLINICAL DATA:  Shortness of breath for 3 days, worse tonight. Tightness in the chest. Clammy. EXAM: PORTABLE CHEST 1 VIEW COMPARISON:  12/08/2015 FINDINGS: Cardiac enlargement with pulmonary vascular congestion. Basilar interstitial changes likely representing early interstitial edema. Possible small left pleural effusion. No consolidation. No pneumothorax. Mediastinal contours appear intact. IMPRESSION: Cardiac enlargement with pulmonary vascular congestion and mild interstitial edema. Possible small left pleural effusion. Electronically Signed   By: Lucienne Capers M.D.   On: 11/01/2017 04:29    ____________________________________________   PROCEDURES  Procedure(s) performed: please, see procedure note(s).  .Critical Care Performed by: Loney Hering, MD Authorized  by: Loney Hering, MD   Critical care provider statement:    Critical care time (minutes):  30   Critical care start time:  11/01/2017 3:47 AM   Critical care end time:  11/01/2017 4:17 AM   Critical care time was exclusive of:  Separately billable procedures and treating other patients   Critical care was necessary to treat or prevent imminent or life-threatening deterioration of the following conditions:  Respiratory failure and renal failure   Critical care was time spent personally by me on the following activities:  Development of treatment plan with patient or surrogate, evaluation of patient's response to treatment, examination of patient, obtaining history from patient or surrogate, ordering and performing treatments and interventions, ordering and review of laboratory studies, ordering and review of radiographic studies, pulse oximetry, re-evaluation of patient's condition and review of old charts   I assumed direction of critical care for this patient from another provider in my specialty: no      Critical Care performed: Yes, see critical care  note(s)  ____________________________________________   INITIAL IMPRESSION / ASSESSMENT AND PLAN / ED COURSE  As part of my medical decision making, I reviewed the following data within the electronic MEDICAL RECORD NUMBER Notes from prior ED visits and  Controlled Substance Database  This is a 32 year old male who comes into the hospital today with some shortness of breath and chest tightness.  The patient is a smoker.    My differential diagnosis is bronchitis, COPD, acute coronary syndrome  We did check some blood work on the patient to include a CBC, BMP, troponin, BNP, blood gas and an x-ray.  The patient CBC showed a white blood cell count of 14.8 and the patient's blood gas was unremarkable.  The patient though has a creatinine of 3.16 which is new from 2 years ago.  The patient's troponin was also elevated at 0.19.  The chest x-ray showed some pulmonary edema and interstitial opacities.  The patient's BNP is 1384.  When we did check the patient's vital signs his blood pressure was significantly elevated in the 240s over 150s.  We initially give the patient 3 DuoNeb's and a dose of Solu-Medrol.  Once I saw the results of the chest x-ray I gave the patient in Nitropaste to his chest and then some lasix.  The patient's breathing improved but his blood pressure was still elevated.  I initially placed the patient on nitro drip to try to bring down his pressure but it did not help so than he was placed on nicardipine.  I also ordered heparin for the patient given his elevated troponin.  I contacted the hospitalist who will admit the patient to the stepdown unit.  After the medications and intervention the patient feels improved.      ____________________________________________   FINAL CLINICAL IMPRESSION(S) / ED DIAGNOSES  Final diagnoses:  Hypertensive emergency  Shortness of breath  Acute pulmonary edema (HCC)  Acute renal failure, unspecified acute renal failure type ([place])  Acute  coronary syndrome [facility])     ED Discharge Orders    None       Note:  This document was prepared using Dragon voice recognition software and may include unintentional dictation errors.    Loney Hering, MD 11/01/17 [phone]

## 2017-11-01 NOTE — Consult Note (Addendum)
PHARMACY CONSULT NOTE - INITIAL   Pharmacy Consult for Electrolyte Monitoring and Replacement   Labs: Potassium (mmol/L)  Date Value  11/01/2017 3.0 (L)  04/12/2014 3.9   Magnesium (mg/dL)  Date Value  11/01/2017 2.2   Calcium (mg/dL)  Date Value  11/01/2017 9.0   Calcium, Total (mg/dL)  Date Value  04/12/2014 9.0   Phosphorus (mg/dL)  Date Value  11/01/2017 1.5 (L)   Albumin (g/dL)  Date Value  04/12/2014 4.3  ] Estimated Creatinine Clearance: 37.2 mL/min (A) (by C-G formula based on SCr of 3.16 mg/dL (H)).  Assessment: Pharmacy consulted for electrolyte monitoring and replacement in 32yo male admitted with Hypertensive urgency/emergency, elevated troponin,  pulmonary edema, and AKI. Patient had hypokalemia and hypophosphatemia on admission.   Goal of Therapy:  Electrolytes WNL  Plan:  8/19 KCL 53mEq x 1 dose ordered. Will order KPhos Neutral Tabs 500mg  every 4 hours x 3 doses.   Recheck electrolytes with AM labs   Pharmacy will continue to replace as needed.   Pernell Dupre, PharmD, BCPS Clinical Pharmacist 11/01/2017 12:18 PM

## 2017-11-01 NOTE — Consult Note (Signed)
[facility] CONSULT NOTE    Date: 11/01/2017                  Patient Name:  Jeremiah Keith  MRN: [id number]  DOB: Jan 14, 1986  Age / Sex: 32 y.o., male         PCP: [name], MD                 Service Requesting Consult: Critical Care                 Reason for Consult: Acute renal failure, severe hypertension            History of Present Illness: Patient is a 32 y.o. male with a PMHx of hypertension, history of substance abuse, depression, who was admitted to [facility] on 11/01/2017 for evaluation of significant shortness of breath.  Patient has known history of hypertension but has not been taking any medications for quite some time.  He has been having intermittent chest pain since this past Thursday.  He is also had associated shortness of breath particularly with exertion.  The symptoms progressed this a.m. and subsequently patient came to the hospital for evaluation.  Initial blood pressure was quite high at 244/144.  He was admitted to the critical care unit.  He has been started on nitroglycerin drip as well as nicardipine drip.  2D echocardiogram is currently pending.  We have been asked to see him for evaluation management of acute renal failure.  His baseline creatinine appears to be between 1.2-1.3.  Patient does appear to have some proteinuria approximately 0.5 g.  No complete urinalysis performed however.   Medications: Outpatient medications: No medications prior to admission.    Current medications: Current Facility-Administered Medications  Medication Dose Route Frequency Provider Last Rate Last Dose  . acetaminophen (TYLENOL) tablet 650 mg  650 mg Oral Q6H PRN Arta Silence, MD       Or  . acetaminophen (TYLENOL) suppository 650 mg  650 mg Rectal Q6H PRN Arta Silence, MD      . aspirin chewable tablet 81 mg  81 mg Oral Daily Jodell Cipro, Aliene Altes, MD      . bisacodyl (DULCOLAX) EC tablet 5 mg  5 mg Oral Daily PRN  Arta Silence, MD      . carvedilol (COREG) tablet 12.5 mg  12.5 mg Oral BID WC Sridharan, Prasanna, MD      . heparin ADULT infusion 100 units/mL (25000 units/225mL sodium chloride 0.45%)  1,050 Units/hr Intravenous Continuous Arta Silence, MD 10.5 mL/hr at 11/01/17 0713 1,050 Units/hr at 11/01/17 0713  . nicardipine (CARDENE) 20mg  in 0.86% saline 232ml IV infusion (0.1 mg/ml)  3-15 mg/hr Intravenous Continuous Hallaji, Sheema M, RPH      . ondansetron (ZOFRAN) tablet 4 mg  4 mg Oral Q6H PRN Arta Silence, MD       Or  . ondansetron (ZOFRAN) injection 4 mg  4 mg Intravenous Q6H PRN Sridharan, Prasanna, MD      . phosphorus (K PHOS NEUTRAL) tablet 500 mg  500 mg Oral Q4H Hallaji, Sheema M, RPH      . senna-docusate (Senokot-S) tablet 1 tablet  1 tablet Oral QHS PRN Arta Silence, MD          Allergies: Allergies  Allergen Reactions  . Penicillins Rash    Has patient had a PCN reaction causing immediate rash, facial/tongue/throat swelling, SOB or lightheadedness with hypotension: Yes Has patient had a PCN reaction causing severe  rash involving mucus membranes or skin necrosis: No Has patient had a PCN reaction that required hospitalization: No Has patient had a PCN reaction occurring within the last 10 years: No If all of the above answers are "NO", then may proceed with Cephalosporin use.       Past Medical History: Past Medical History:  Diagnosis Date  . Depression    after death of family members  . History of chicken pox   . Hypertension   . Substance abuse ([place])    h/o cocaine abuse     Past Surgical History: Past Surgical History:  Procedure Laterality Date  . TONSILLECTOMY AND ADENOIDECTOMY  1990     Family History: Family History  Problem Relation Age of Onset  . Heart disease Mother   . Diabetes Father   . Hypertension Father   . Prostate cancer Maternal Grandfather        possible prostate cancer  . Colon cancer Neg Hx       Social History: Social History   Socioeconomic History  . Marital status: Married    Spouse name: Not on file  . Number of children: Not on file  . Years of education: 62  . Highest education level: Not on file  Occupational History  . Not on file  Social Needs  . Financial resource strain: Not on file  . Food insecurity:    Worry: Not on file    Inability: Not on file  . Transportation needs:    Medical: Not on file    Non-medical: Not on file  Tobacco Use  . Smoking status: Current Every Day Smoker    Packs/day: 25.00    Types: Cigarettes  . Smokeless tobacco: Never Used  Substance and Sexual Activity  . Alcohol use: Yes    Alcohol/week: 0.0 standard drinks  . Drug use: Not Currently  . Sexual activity: Not on file  Lifestyle  . Physical activity:    Days per week: Not on file    Minutes per session: Not on file  . Stress: Not on file  Relationships  . Social connections:    Talks on phone: Not on file    Gets together: Not on file    Attends religious service: Not on file    Active member of club or organization: Not on file    Attends meetings of clubs or organizations: Not on file    Relationship status: Not on file  . Intimate partner violence:    Fear of current or ex partner: Not on file    Emotionally abused: Not on file    Physically abused: Not on file    Forced sexual activity: Not on file  Other Topics Concern  . 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  HENT: Negative for congestion, hearing loss and tinnitus.   Eyes: Positive for blurred vision.  Respiratory: Positive for shortness of breath.   Cardiovascular: Positive for chest pain and orthopnea.  Gastrointestinal: Negative for heartburn, nausea and vomiting.  Genitourinary: Negative for  dysuria, frequency and urgency.  Musculoskeletal: Negative for joint pain and myalgias.  Skin: Negative for itching and rash.  Neurological: Negative for dizziness and focal weakness.  Endo/Heme/Allergies: Negative for polydipsia. Does not bruise/bleed easily.  Psychiatric/Behavioral:  Negative for depression. The patient is nervous/anxious.      Vital Signs: Blood pressure (!) 216/128, pulse 92, resp. rate 16, height 5\' 6"  (1.676 m), weight 102.1 kg, SpO2 96 %.  Weight trends: Filed Weights   11/01/17 0349  Weight: 102.1 kg    Physical Exam: General: NAD, sitting up in bed  Head: Normocephalic, atraumatic.  Eyes: Anicteric, EOMI  Nose: Mucous membranes moist, not inflammed, nonerythematous.  Throat: Oropharynx nonerythematous, no exudate appreciated.   Neck: Supple, trachea midline.  Lungs:  Normal respiratory effort. Clear to auscultation BL without crackles or wheezes.  Heart: RRR. S1 and S2 normal without gallop, murmur, or rubs.  Abdomen:  BS normoactive. Soft, Nondistended, non-tender.  No masses or organomegaly.  Extremities: No pretibial edema.  Neurologic: A&O X3, Motor strength is 5/5 in the all 4 extremities  Skin: No visible rashes, scars.    Lab results: Basic Metabolic Panel: Recent Labs  Lab 11/01/17 0350 11/01/17 0650  NA 139  --   K 3.0*  --   CL 101  --   CO2 29  --   GLUCOSE 130*  --   BUN 36*  --   CREATININE 3.16*  --   CALCIUM 9.0  --   MG  --  2.2  PHOS  --  1.5*    Liver Function Tests: No results for input(s): AST, ALT, ALKPHOS, BILITOT, PROT, ALBUMIN in the last 168 hours. No results for input(s): LIPASE, AMYLASE in the last 168 hours. No results for input(s): AMMONIA in the last 168 hours.  CBC: Recent Labs  Lab 11/01/17 0350  WBC 14.8*  HGB 12.6*  HCT 35.2*  MCV 86.8  PLT 217    Cardiac Enzymes: Recent Labs  Lab 11/01/17 0350 11/01/17 0650 11/01/17 0930  TROPONINI 0.19* 0.22* 0.17*    BNP: Invalid input(s):  POCBNP  CBG: Recent Labs  Lab 11/01/17 0756  GLUCAP 192*    Microbiology: No results found for this or any previous visit.  Coagulation Studies: Recent Labs    11/01/17 0650  LABPROT 14.5  INR 1.14    Urinalysis: No results for input(s): COLORURINE, LABSPEC, PHURINE, GLUCOSEU, HGBUR, BILIRUBINUR, KETONESUR, PROTEINUR, UROBILINOGEN, NITRITE, LEUKOCYTESUR in the last 72 hours.  Invalid input(s): APPERANCEUR    Imaging: US Renal  Result Date: 11/01/2017 CLINICAL DATA:  Initial evaluation for acute renal injury. EXAM: RENAL / URINARY TRACT ULTRASOUND COMPLETE COMPARISON:  Prior CT from 10/09/2012. FINDINGS: Right Kidney: Length: 10.2 cm. Echogenicity within normal limits. No mass or hydronephrosis visualized. Left Kidney: Length: 9.3 cm. Echogenicity within normal limits. No mass or hydronephrosis visualized. Bladder: Appears normal for degree of bladder distention. Incidental note made of a left pleural effusion. IMPRESSION: 1. Normal renal ultrasound.  No hydronephrosis. 2. Left pleural effusion. Electronically Signed   By: Jeannine Boga M.D.   On: 11/01/2017 06:59   Dg Chest Portable 1 View  Result Date: 11/01/2017 CLINICAL DATA:  Shortness of breath for 3 days, worse tonight. Tightness in the chest. Clammy. EXAM: PORTABLE CHEST 1 VIEW COMPARISON:  12/08/2015 FINDINGS: Cardiac enlargement with pulmonary vascular congestion. Basilar interstitial changes likely representing early interstitial edema. Possible small left pleural effusion. No consolidation. No pneumothorax. Mediastinal contours appear intact. IMPRESSION: Cardiac enlargement with pulmonary vascular congestion and mild interstitial edema. Possible small left pleural effusion. Electronically Signed   By: Lucienne Capers M.D.   On: 11/01/2017 04:29      Assessment & Plan: Pt is a 32 y.o. male with a PMHx  of hypertension, history of substance abuse, depression, who was admitted to [facility] on 11/01/2017 for evaluation  of significant shortness of breath.    1.  Acute renal failure. 2.  Proteinuria. 3.  Hypertension, severe, not on outpatient medications.  4.  Chest pain with shortness of breath.   Plan: We are asked to see the patient for evaluation management of acute renal failure in the setting of severe and uncontrolled hypertension.  He has been admitted to the critical care unit.  Patient has been started on nicardipine drip which we agree with.  We recommend a systolic blood pressure target between 1 70-1 80 in the next 24 hours.  Okay to proceed with a carvedilol as well.  Agree with obtaining 2D echocardiogram to more fully evaluate his heart function.  We will also order renal ultrasound, urine protein to creatinine ratio, and complete urinalysis.  No indication for dialysis at the moment.  Further plan as patient progresses.

## 2017-11-01 NOTE — Progress Notes (Signed)
ANTICOAGULATION CONSULT NOTE -  Pharmacy Consult for heparin Indication: chest pain/ACS  Patient Measurements: Height: 5\' 6"  (167.6 cm) Weight: 225 lb (102.1 kg) IBW/kg (Calculated) : 63.8 Heparin Dosing Weight: 86.4 kg  Vital Signs: Temp: 98.1 F (36.7 C) (08/19 2000) Temp Source: Oral (08/19 2000) BP: 181/110 (08/19 2130) Pulse Rate: 101 (08/19 2130)  Labs: Recent Labs    11/01/17 0350 11/01/17 0650 11/01/17 0930 11/01/17 1338 11/01/17 2057  HGB 12.6*  --   --   --   --   HCT 35.2*  --   --   --   --   PLT 217  --   --   --   --   APTT  --  >160* 61*  --   --   LABPROT  --  14.5  --   --   --   INR  --  1.14  --   --   --   HEPARINUNFRC  --  0.89*  --  0.18* 0.31  CREATININE 3.16*  --   --   --   --   TROPONINI 0.19* 0.22* 0.17*  --   --     Estimated Creatinine Clearance: 37.2 mL/min (A) (by C-G formula based on SCr of 3.16 mg/dL (H)).   Medical History: Past Medical History:  Diagnosis Date  . Depression    after death of family members  . History of chicken pox   . Hypertension   . Substance abuse ([place])    h/o cocaine abuse   Assessment: 32 yo Male patient admitted for CP, found to have trops of 0.19. No pta anticoag. started on a heparin drip @ 1050 units/hr.    Goal of Therapy:  Heparin level 0.3-0.7 units/ml Monitor platelets by anticoagulation protocol: Yes   Plan:  08/19 @ 1338 HL 0.18. Level is subtherapeutic. Will order 2000 unit bolus and increase infusion to 1300units/hr.   8/19 ~ 21:00 HL = 0.31.  Continue current drip rate.  Recheck HL on 8/20 at 03:00. Will monitor daily CBC's and adjust per anti-Xa levels.    Olivia Canter [facility] Clinical Pharmacist 11/01/2017 9:41 PM

## 2017-11-01 NOTE — Consult Note (Signed)
Reason for Consult:hypertensive emergency Referring Physician: Hospitalist  DAREL RICKETTS is an 32 y.o. male.  HPI: Mr. Sippel is a 32 year old gentleman with a past medical history remarkable for substance abuse, depression, with a known history of hypertension but has not been interested in therapy, presented to the emergency department with episode of shortness of breath. He's also been having some intermittent chest pain since Thursday also with blurry vision. In emergency department his initial blood pressure was 244/144. He was started on a Cardene infusion along with nitroglycerin was subsequently transferred to the intensive care unit. Pertinent labs were remarkable for troponin of 0.22, a phosphorus of 1.5Amma BNP of 1384, potassium of 3, BUN of 36 and creatinine 3.16white count of 14.8  Past Medical History:  Diagnosis Date  . Depression    after death of family members  . History of chicken pox   . Hypertension   . Substance abuse ([place])    h/o cocaine abuse    Past Surgical History:  Procedure Laterality Date  . TONSILLECTOMY AND ADENOIDECTOMY  1990    Family History  Problem Relation Age of Onset  . Heart disease Mother   . Diabetes Father   . Hypertension Father   . Prostate cancer Maternal Grandfather        possible prostate cancer  . Colon cancer Neg Hx     Social History:  reports that he has been smoking cigarettes. He has been smoking about 25.00 packs per day. He has never used smokeless tobacco. He reports that he drinks alcohol. He reports that he has current or past drug history.  Allergies:  Allergies  Allergen Reactions  . Penicillins Rash    Has patient had a PCN reaction causing immediate rash, facial/tongue/throat swelling, SOB or lightheadedness with hypotension: Yes Has patient had a PCN reaction causing severe rash involving mucus membranes or skin necrosis: No Has patient had a PCN reaction that required hospitalization: No Has  patient had a PCN reaction occurring within the last 10 years: No If all of the above answers are "NO", then may proceed with Cephalosporin use.     Medications: I have reviewed the patient's current medications.  Results for orders placed or performed during the hospital encounter of 11/01/17 (from the past 48 hour(s))  CBC     Status: Abnormal   Collection Time: 11/01/17  3:50 AM  Result Value Ref Range   WBC 14.8 (H) 3.8 - 10.6 K/uL   RBC 4.06 (L) 4.40 - 5.90 MIL/uL   Hemoglobin 12.6 (L) 13.0 - 18.0 g/dL   HCT 35.2 (L) 40.0 - 52.0 %   MCV 86.8 80.0 - 100.0 fL   MCH 31.2 26.0 - 34.0 pg   MCHC 35.9 32.0 - 36.0 g/dL   RDW 13.6 11.5 - 14.5 %   Platelets 217 150 - 440 K/uL    Comment: Performed at [facility], [address] [place], [place] [address]  Basic metabolic panel     Status: Abnormal   Collection Time: 11/01/17  3:50 AM  Result Value Ref Range   Sodium 139 135 - 145 mmol/L   Potassium 3.0 (L) 3.5 - 5.1 mmol/L   Chloride 101 98 - 111 mmol/L   CO2 29 22 - 32 mmol/L   Glucose, Bld 130 (H) 70 - 99 mg/dL   BUN 36 (H) 6 - 20 mg/dL   Creatinine, Ser 3.16 (H) 0.61 - 1.24 mg/dL   Calcium 9.0 8.9 - 10.3 mg/dL  GFR calc non Af Amer 24 (L) >60 mL/min   GFR calc Af Amer 28 (L) >60 mL/min    Comment: (NOTE) The eGFR has been calculated using the CKD EPI equation. This calculation has not been validated in all clinical situations. eGFR's persistently <60 mL/min signify possible Chronic Kidney Disease.    Anion gap 9 5 - 15    Comment: Performed at [facility], [address] [place], [place] [address]  Troponin I     Status: Abnormal   Collection Time: 11/01/17  3:50 AM  Result Value Ref Range   Troponin I 0.19 (HH) <0.03 ng/mL    Comment: CRITICAL RESULT CALLED TO, READ BACK BY AND VERIFIED WITH REBECCA LYNN AT 0442 ON 11/01/17 [place]. Performed at [facility], [address] [place], [place] [address]   Brain natriuretic peptide      Status: Abnormal   Collection Time: 11/01/17  3:50 AM  Result Value Ref Range   B Natriuretic Peptide 1,384.0 (H) 0.0 - 100.0 pg/mL    Comment: Performed at [facility], [address] [place], [place] [address]  Blood gas, venous     Status: Abnormal   Collection Time: 11/01/17  5:06 AM  Result Value Ref Range   pH, Ven 7.45 (H) 7.250 - 7.430   pCO2, Ven 41 (L) 44.0 - 60.0 mmHg   pO2, Ven 87.0 (H) 32.0 - 45.0 mmHg   Bicarbonate 28.5 (H) 20.0 - 28.0 mmol/L   Acid-Base Excess 4.1 (H) 0.0 - 2.0 mmol/L   O2 Saturation 97.1 %   Patient temperature 37.0    Collection site LINE    Sample type VENOUS     Comment: Performed at [facility], [address] [place], [place] [address]  Troponin I     Status: Abnormal   Collection Time: 11/01/17  6:50 AM  Result Value Ref Range   Troponin I 0.22 (HH) <0.03 ng/mL    Comment: CRITICAL VALUE NOTED. VALUE IS CONSISTENT WITH PREVIOUSLY REPORTED/CALLED VALUE...[facility] Performed at [facility], [address], [place], [place] [address]   Magnesium     Status: None   Collection Time: 11/01/17  6:50 AM  Result Value Ref Range   Magnesium 2.2 1.7 - 2.4 mg/dL    Comment: Performed at [facility], [address] [place], [place] [address]  Phosphorus     Status: Abnormal   Collection Time: 11/01/17  6:50 AM  Result Value Ref Range   Phosphorus 1.5 (L) 2.5 - 4.6 mg/dL    Comment: Performed at [facility], [address] [place], [place] [address]  Creatinine, urine, random     Status: None   Collection Time: 11/01/17  6:50 AM  Result Value Ref Range   Creatinine, Urine 17 mg/dL    Comment: Performed at [facility], [address] [place], [place] [address]  Protein, urine, random     Status: None   Collection Time: 11/01/17  6:50 AM  Result Value Ref Range   Total Protein, Urine 8 mg/dL    Comment: NO NORMAL RANGE ESTABLISHED FOR THIS TEST Performed at [facility], [address] [place],  [address]   Na and K (sodium & potassium), rand urine     Status: None   Collection Time: 11/01/17  6:50 AM  Result Value Ref Range   Sodium, Ur 98 mmol/L   Potassium Urine 12 mmol/L    Comment: Performed at [facility], [address] [place], [place] [address]  Protime-INR     Status: None   Collection Time: 11/01/17  6:50 AM  Result Value Ref Range   Prothrombin Time 14.5 11.4 - 15.2 seconds   INR 1.14     Comment: Performed at [facility], [address] [place], [place] [address]  APTT     Status: Abnormal   Collection Time: 11/01/17  6:50 AM  Result Value Ref Range   aPTT >160 (HH) 24 - 36 seconds    Comment:        IF BASELINE aPTT IS ELEVATED, SUGGEST PATIENT RISK ASSESSMENT BE USED TO DETERMINE APPROPRIATE ANTICOAGULANT THERAPY. CRITICAL RESULT CALLED TO, READ BACK BY AND VERIFIED WITH: STACEY HOLLEY AT 0801 ON 11/01/2017 [place] Performed at [facility], [address] [place], [place] [address]   Heparin level (unfractionated)     Status: Abnormal   Collection Time: 11/01/17  6:50 AM  Result Value Ref Range   Heparin Unfractionated 0.89 (H) 0.30 - 0.70 IU/mL    Comment: (NOTE) If heparin results are below expected values, and patient dosage has  been confirmed, suggest follow up testing of antithrombin III levels. Performed at [facility], [address], [place], [place] [address]   Urine Drug Screen, Qualitative [facility] only)     Status: Abnormal   Collection Time: 11/01/17  6:50 AM  Result Value Ref Range   Tricyclic, Ur Screen NONE DETECTED NONE DETECTED   Amphetamines, Ur Screen NONE DETECTED NONE DETECTED   MDMA (Ecstasy)Ur Screen NONE DETECTED NONE DETECTED   Cocaine Metabolite,Ur [place] NONE DETECTED NONE DETECTED   Opiate, Ur Screen NONE DETECTED NONE DETECTED   Phencyclidine (PCP) Ur S NONE DETECTED NONE DETECTED   Cannabinoid 50 Ng, Ur [place] NONE DETECTED NONE DETECTED   Barbiturates, Ur  Screen NONE DETECTED NONE DETECTED   Benzodiazepine, Ur Scrn TEST NOT PERFORMED, REAGENT NOT AVAILABLE (A) NONE DETECTED   Methadone Scn, Ur NONE DETECTED NONE DETECTED    Comment: (NOTE) Tricyclics + metabolites, urine    Cutoff 1000 ng/mL Amphetamines + metabolites, urine  Cutoff 1000 ng/mL MDMA (Ecstasy), urine              Cutoff 500 ng/mL Cocaine Metabolite, urine          Cutoff 300 ng/mL Opiate + metabolites, urine        Cutoff 300 ng/mL Phencyclidine (PCP), urine         Cutoff 25 ng/mL Cannabinoid, urine                 Cutoff 50 ng/mL Barbiturates + metabolites, urine  Cutoff 200 ng/mL Benzodiazepine, urine              Cutoff 200 ng/mL Methadone, urine                   Cutoff 300 ng/mL The urine drug screen provides only a preliminary, unconfirmed analytical test result and should not be used for non-medical purposes. Clinical consideration and professional judgment should be applied to any positive drug screen result due to possible interfering substances. A more specific alternate chemical method must be used in order to obtain a confirmed analytical result. Gas chromatography / mass spectrometry (GC/MS) is the preferred confirmat ory method. Performed at [facility], [address] [place],  [address]   Urinalysis, Complete w Microscopic     Status: Abnormal   Collection Time: 11/01/17  6:50 AM  Result Value Ref Range   Color, Urine COLORLESS (A) YELLOW   APPearance CLEAR (A) CLEAR   Specific Gravity, Urine 1.005 1.005 - 1.030   pH 6.0 5.0 - 8.0   Glucose, UA 50 (A) NEGATIVE mg/dL   Hgb urine dipstick NEGATIVE NEGATIVE   Bilirubin Urine NEGATIVE NEGATIVE   Ketones, ur NEGATIVE NEGATIVE mg/dL   Protein, ur NEGATIVE NEGATIVE mg/dL   Nitrite NEGATIVE NEGATIVE   Leukocytes, UA NEGATIVE NEGATIVE   RBC / HPF 0-5 0 - 5 RBC/hpf   WBC, UA 0-5 0 - 5 WBC/hpf   Bacteria, UA NONE SEEN NONE SEEN   Squamous Epithelial / LPF NONE SEEN 0 - 5    Comment:  Performed at [facility], [address] [place], [place] [address]  Protein / creatinine ratio, urine     Status: Abnormal   Collection Time: 11/01/17  6:50 AM  Result Value Ref Range   Creatinine, Urine 16 mg/dL   Total Protein, Urine 9 mg/dL    Comment: NO NORMAL RANGE ESTABLISHED FOR THIS TEST   Protein Creatinine Ratio 0.56 (H) 0.00 - 0.15 mg/mg[Cre]    Comment: Performed at [facility], [address] [place], [place] [address]  Glucose, capillary     Status: Abnormal   Collection Time: 11/01/17  7:56 AM  Result Value Ref Range   Glucose-Capillary 192 (H) 70 - 99 mg/dL  Troponin I     Status: Abnormal   Collection Time: 11/01/17  9:30 AM  Result Value Ref Range   Troponin I 0.17 (HH) <0.03 ng/mL    Comment: CRITICAL VALUE NOTED. VALUE IS CONSISTENT WITH PREVIOUSLY REPORTED/CALLED VALUE...[facility] Performed at [facility], [address] [place], [place] [address]   APTT     Status: Abnormal   Collection Time: 11/01/17  9:30 AM  Result Value Ref Range   aPTT 61 (H) 24 - 36 seconds    Comment:        IF BASELINE aPTT IS ELEVATED, SUGGEST PATIENT RISK ASSESSMENT BE USED TO DETERMINE APPROPRIATE ANTICOAGULANT THERAPY. Performed at [facility], [address], [place], [place] [address]     US Renal  Result Date: 11/01/2017 CLINICAL DATA:  Initial evaluation for acute renal injury. EXAM: RENAL / URINARY TRACT ULTRASOUND COMPLETE COMPARISON:  Prior CT from 10/09/2012. FINDINGS: Right Kidney: Length: 10.2 cm. Echogenicity within normal limits. No mass or hydronephrosis visualized. Left Kidney: Length: 9.3 cm. Echogenicity within normal limits. No mass or hydronephrosis visualized. Bladder: Appears normal for degree of bladder distention. Incidental note made of a left pleural effusion. IMPRESSION: 1. Normal renal ultrasound.  No hydronephrosis. 2. Left pleural effusion. Electronically Signed   By: Jeannine Boga M.D.   On: 11/01/2017  06:59   Dg Chest Portable 1 View  Result Date: 11/01/2017 CLINICAL DATA:  Shortness of breath for 3 days, worse tonight. Tightness in the chest. Clammy. EXAM: PORTABLE CHEST 1 VIEW COMPARISON:  12/08/2015 FINDINGS: Cardiac enlargement with pulmonary vascular congestion. Basilar interstitial changes likely representing early interstitial edema. Possible small left pleural effusion. No consolidation. No pneumothorax. Mediastinal contours appear intact. IMPRESSION: Cardiac enlargement with pulmonary vascular congestion and mild interstitial edema. Possible small left pleural effusion. Electronically Signed   By: Lucienne Capers M.D.   On: 11/01/2017 04:29    ROS Blood pressure (!) 216/128, pulse 92, resp. rate 16, height _0  (1.676 m), weight 102.1 kg, SpO2 96 %. Physical Exam Vital signs: Please see the above listed vital signs. Patient  is still hypertensive on Cardene and glycerin HEENT: Trachea midline, no oral lesions appreciated, jugular venous distention noted Cardiovascular: Regular rate and rhythm with S4, systolic murmur left parasternal border Pulmonary: Clear to auscultation Abdominal: Positive bowel sounds, soft nontender no palpable megaly Extremities: No clubbing cyanosis or edema noted Neurologic: No clear focal deficits noted Cutaneous: No rashes or lesions noted  Assessment/Plan:  Hypertensive emergency with any evidence of end organ damage to include renal disease, positive troponin, abnormal EKG along with visual disturbances. Patient is presently on Cardene and nitroglycerin, will start oral Norvasc, Coreg and begin weaning Cardene as tolerated with a pressure no lower than 160. Will need to add additional antihypertensives. Appreciate nephrology's input will also consult cardiology, obtain echocardiogram.  Shoua Ulloa 11/01/2017, 11:43 AM

## 2017-11-01 NOTE — Progress Notes (Signed)
*  PRELIMINARY RESULTS* Echocardiogram 2D Echocardiogram has been performed.  Jeremiah Keith 11/01/2017, 1:10 PM

## 2017-11-01 NOTE — Progress Notes (Addendum)
ANTICOAGULATION CONSULT NOTE - Initial Consult  Pharmacy Consult for heparin Indication: chest pain/ACS  Patient Measurements: Height: 5\' 6"  (167.6 cm) Weight: 225 lb (102.1 kg) IBW/kg (Calculated) : 63.8 Heparin Dosing Weight: 86.4 kg  Vital Signs: BP: 216/128 (08/19 0720) Pulse Rate: 92 (08/19 0720)  Labs: Recent Labs    11/01/17 0350 11/01/17 0650 11/01/17 0930 11/01/17 1338  HGB 12.6*  --   --   --   HCT 35.2*  --   --   --   PLT 217  --   --   --   APTT  --  >160* 61*  --   LABPROT  --  14.5  --   --   INR  --  1.14  --   --   HEPARINUNFRC  --  0.89*  --  0.18*  CREATININE 3.16*  --   --   --   TROPONINI 0.19* 0.22* 0.17*  --     Estimated Creatinine Clearance: 37.2 mL/min (A) (by C-G formula based on SCr of 3.16 mg/dL (H)).   Medical History: Past Medical History:  Diagnosis Date  . Depression    after death of family members  . History of chicken pox   . Hypertension   . Substance abuse ([place])    h/o cocaine abuse   Assessment: 32 yo Male patient admitted for CP, found to have trops of 0.19. No pta anticoag. started on a heparin drip @ 1050 units/hr.    Goal of Therapy:  Heparin level 0.3-0.7 units/ml Monitor platelets by anticoagulation protocol: Yes   Plan:  08/19 @ 1338 HL 0.18. Level is subtherapeutic. Will order 2000 unit bolus and increase infusion to 1300units/hr.   Will check next anti-Xa in 6 hours after dose change Will monitor daily CBC's and adjust per anti-Xa levels.  Pernell Dupre, PharmD, BCPS Clinical Pharmacist 11/01/2017 2:19 PM

## 2017-11-01 NOTE — H&P (Addendum)
[facility] [place] NAME: Jeremiah Keith    MR#:  [id number]  DATE OF BIRTH:  [birth year]  DATE OF ADMISSION:  11/01/2017  PRIMARY CARE PHYSICIAN: [name], MD   REQUESTING/REFERRING PHYSICIAN: Loney Hering, MD  CHIEF COMPLAINT:   Chief Complaint  Patient presents with  . Respiratory Distress    HISTORY OF PRESENT ILLNESS:  Jeremiah Keith  is a 32 y.o. male with a known history of HTN p/w CP/SOB. Pt states he used to take a small white pill for blood pressure. He stopped taking it ~63yrs ago. He states he has been having intermittent chest pain/tightness/heaviness on & off since Thursday 10/28/2017. Symptoms were not severe, so he did not pay them much mind. He tells me he was woken up from sleep @~0230AM on 11/01/2017 w/ CP/SOB similar in character and intensity. He is uninsured, and did not want to go to the hospital, so he tried to walk around the house. He states his symptoms became worse. He decided he needed to seek medical attention, and drove himself to the ED. He states he became very short of breath walking from his car to the ED entrance, and endorses decreased exercise tolerance. He endorses intermittent blurred vision since Thursday 08/15. He denies HA or N/V. He endorses good PO intake, and has had good urine output. He smokes 2-3 cigarettes per day. He looks well, and states he feels well.  PAST MEDICAL HISTORY:   Past Medical History:  Diagnosis Date  . Depression    after death of family members  . History of chicken pox   . Hypertension   . Substance abuse ([place])    h/o cocaine abuse    PAST SURGICAL HISTORY:   Past Surgical History:  Procedure Laterality Date  . TONSILLECTOMY AND ADENOIDECTOMY  1990    SOCIAL HISTORY:   Social History   Tobacco Use  . Smoking status: Current Every Day Smoker    Packs/day: 25.00    Types: Cigarettes  . Smokeless tobacco: Never Used  Substance Use  Topics  . Alcohol use: Yes    Alcohol/week: 0.0 standard drinks    FAMILY HISTORY:   Family History  Problem Relation Age of Onset  . Heart disease Mother   . Diabetes Father   . Hypertension Father   . Prostate cancer Maternal Grandfather        possible prostate cancer  . Colon cancer Neg Hx     DRUG ALLERGIES:   Allergies  Allergen Reactions  . Penicillins Rash    Has patient had a PCN reaction causing immediate rash, facial/tongue/throat swelling, SOB or lightheadedness with hypotension: Yes Has patient had a PCN reaction causing severe rash involving mucus membranes or skin necrosis: No Has patient had a PCN reaction that required hospitalization: No Has patient had a PCN reaction occurring within the last 10 years: No If all of the above answers are "NO", then may proceed with Cephalosporin use.     REVIEW OF SYSTEMS:   Review of Systems  Constitutional: Negative for chills, diaphoresis, fever, malaise/fatigue and weight loss.  HENT: Negative for congestion, ear pain, hearing loss, nosebleeds, sinus pain, sore throat and tinnitus.   Eyes: Positive for blurred vision. Negative for double vision and photophobia.  Respiratory: Positive for shortness of breath. Negative for cough, hemoptysis, sputum production and wheezing.   Cardiovascular: Positive for chest pain. Negative for palpitations, orthopnea, claudication, leg swelling and PND.  Gastrointestinal:  Negative for abdominal pain, blood in stool, constipation, diarrhea, heartburn, melena, nausea and vomiting.  Genitourinary: Negative for dysuria, frequency, hematuria and urgency.  Musculoskeletal: Negative for back pain, joint pain, myalgias and neck pain.  Skin: Negative for itching and rash.  Neurological: Negative for dizziness, tingling, tremors, sensory change, speech change, focal weakness, seizures, loss of consciousness, weakness and headaches.  Psychiatric/Behavioral: Negative for memory loss. The patient  does not have insomnia.    MEDICATIONS AT HOME:   Prior to Admission medications   Not on File   VITAL SIGNS:  Blood pressure (!) 244/144, pulse 98, resp. rate (!) 29, height 5\' 6"  (1.676 m), weight 102.1 kg, SpO2 100 %.  PHYSICAL EXAMINATION:  Physical Exam  Constitutional: He is oriented to person, place, and time. He appears well-developed and well-nourished. He is active and cooperative.  Non-toxic appearance. He does not have a sickly appearance. He does not appear ill. No distress. He is not intubated.  HENT:  Head: Normocephalic and atraumatic.  Mouth/Throat: Oropharynx is clear and moist. No oropharyngeal exudate.  Eyes: Conjunctivae, EOM and lids are normal. No scleral icterus.  Neck: Neck supple. No JVD present. No thyromegaly present.  Cardiovascular: Normal rate, regular rhythm, S1 normal, S2 normal and normal heart sounds.  No extrasystoles are present. Exam reveals no gallop, no S3, no S4, no distant heart sounds and no friction rub.  No murmur heard. Pulmonary/Chest: Effort normal. No accessory muscle usage or stridor. No apnea, no tachypnea and no bradypnea. He is not intubated. No respiratory distress. He has decreased breath sounds in the right upper field, the right middle field, the right lower field, the left upper field, the left middle field and the left lower field. He has no wheezes. He has no rhonchi. He has no rales.  Abdominal: Soft. Bowel sounds are normal. He exhibits no distension. There is no tenderness. There is no rigidity, no rebound and no guarding.  Musculoskeletal: Normal range of motion. He exhibits no edema or tenderness.  Lymphadenopathy:    He has no cervical adenopathy.  Neurological: He is alert and oriented to person, place, and time. He is not disoriented.  Skin: Skin is warm, dry and intact. No rash noted. He is not diaphoretic. No erythema.  Psychiatric: He has a normal mood and affect. His speech is normal and behavior is normal. Judgment  and thought content normal. Cognition and memory are normal.   LABORATORY PANEL:   CBC Recent Labs  Lab 11/01/17 0350  WBC 14.8*  HGB 12.6*  HCT 35.2*  PLT 217   ------------------------------------------------------------------------------------------------------------------  Chemistries  Recent Labs  Lab 11/01/17 0350  NA 139  K 3.0*  CL 101  CO2 29  GLUCOSE 130*  BUN 36*  CREATININE 3.16*  CALCIUM 9.0   ------------------------------------------------------------------------------------------------------------------  Cardiac Enzymes Recent Labs  Lab 11/01/17 0350  TROPONINI 0.19*   ------------------------------------------------------------------------------------------------------------------  RADIOLOGY:  Dg Chest Portable 1 View  Result Date: 11/01/2017 CLINICAL DATA:  Shortness of breath for 3 days, worse tonight. Tightness in the chest. Clammy. EXAM: PORTABLE CHEST 1 VIEW COMPARISON:  12/08/2015 FINDINGS: Cardiac enlargement with pulmonary vascular congestion. Basilar interstitial changes likely representing early interstitial edema. Possible small left pleural effusion. No consolidation. No pneumothorax. Mediastinal contours appear intact. IMPRESSION: Cardiac enlargement with pulmonary vascular congestion and mild interstitial edema. Possible small left pleural effusion. Electronically Signed   By: Lucienne Capers M.D.   On: 11/01/2017 04:29   IMPRESSION AND PLAN:   A/P: 21M p/w CP/SOB, found  to be in hypertensive urgency/emergency, flash pulmonary edema, acute kidney injury (non-oliguric acute renal failure superimposed on CKD). Hypokalemia, hyperglycemia, BNP eleation, Troponin elevation, leukocytosis, normocytic anemia. -CP/SOB, HTN urgency, flash pulm edema, BNP elevation, Troponin elevation: Pt p/w intermittent CP/SOB x~5d. Max BP 249/157. Trop-I 0.19. BNP 1384. CXR (+) cardiomegaly, pulmonary vascular congestion, interstitial edema. Suspect flash  pulmonary edema 2/2 high afterload state in the setting of unctrl HTN/accelerated malignant HTN, HTN emergency. NTG + Cardene gtt, goal SBP 190-200 x24hrs. [id number] Transition to PO agents (Coreg). Low suspicion for coronary ischemia, but pt started on heparin gtt in setting of HTN emergency and troponin elevation. Echocardiogram pending. -AKI: Non-oliguric. 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 Cr 3.16 on present admission. Likely intrinsic renal disease, hypertensive nephropathy. Renal U/S, urine studies (electrolytes, protein, creatinine, urea nitrogen) pending. Nephrology consult. IVF once BP improved, suspect relative intravascular volume depletion 2/2 pressure natriuresis. -Hypokalemia: Replete K+. Mag level pending. -Leukocytosis: Likely reactive. Technically meets SIRS criteria, however there is no source of active infxn identified on present admission. -Normocytic anemia: Likely anemia of chronic disease, possibly in setting of CKD. No evidence of acute blood loss at present time. -FEN/GI: Renal diet. -DVT PPx: Heparin gtt. -Code status: Full code. -Disposition: Admission, > 2 midnights.  All the records are reviewed and case discussed with ED provider. Management plans discussed with the patient, family and they are in agreement.  CODE STATUS: Full code.  TOTAL TIME TAKING CARE OF THIS PATIENT: 90 minutes.    Arta Silence M.D on 11/01/2017 at 6:25 AM  Between 7am to 6pm - Pager - [phone]  After 6pm go to [email or web address] - password [email or web address] [facility]  [employer]  Hospitalists  Office  [phone]  CC: Primary care physician; [name], MD   Note: This dictation was prepared with Dragon dictation along with smaller phrase technology. Any transcriptional errors that result from this process are unintentional.

## 2017-11-01 NOTE — Progress Notes (Signed)
ANTICOAGULATION CONSULT NOTE - Initial Consult  Pharmacy Consult for heparin Indication: chest pain/ACS  Allergies  Allergen Reactions  . Penicillins Rash    Has patient had a PCN reaction causing immediate rash, facial/tongue/throat swelling, SOB or lightheadedness with hypotension: Yes Has patient had a PCN reaction causing severe rash involving mucus membranes or skin necrosis: No Has patient had a PCN reaction that required hospitalization: No Has patient had a PCN reaction occurring within the last 10 years: No If all of the above answers are "NO", then may proceed with Cephalosporin use.     Patient Measurements: Height: 5\' 6"  (167.6 cm) Weight: 225 lb (102.1 kg) IBW/kg (Calculated) : 63.8 Heparin Dosing Weight: 86.4 kg  Vital Signs: BP: 244/144 (08/19 0515) Pulse Rate: 98 (08/19 0515)  Labs: Recent Labs    11/01/17 0350  HGB 12.6*  HCT 35.2*  PLT 217  CREATININE 3.16*  TROPONINI 0.19*    Estimated Creatinine Clearance: 37.2 mL/min (A) (by C-G formula based on SCr of 3.16 mg/dL (H)).   Medical History: Past Medical History:  Diagnosis Date  . Depression    after death of family members  . History of chicken pox   . Hypertension   . Substance abuse ([place])    h/o cocaine abuse    Medications:  Scheduled:  . [START ON 11/02/2017] carvedilol  12.5 mg Oral BID WC  . heparin  4,000 Units Intravenous Once  . potassium chloride  40 mEq Oral Once    Assessment: Patient admitted for CP, found to have trops of 0.19. No pta anticoag, being started on a heparin drip  Goal of Therapy:  Heparin level 0.3-0.7 units/ml Monitor platelets by anticoagulation protocol: Yes   Plan:  Will bolus w/ heparin 4000 units IV x 1 Will start drip @ 1050 units/hr  Baseline labs drawn. Will check next anti-Xa @ 1300 6 hours post start Will monitor daily CBC's and adjust per anti-Xa levels.  Tobie Lords, PharmD, BCPS Clinical Pharmacist 11/01/2017

## 2017-11-01 NOTE — Consult Note (Signed)
Cardiology Consultation:   Patient ID: JACCOB CZAPLICKI; [id number]; 1986/02/16   Admit date: 11/01/2017 Date of Consult: 11/01/2017  Primary Care Provider: [name], MD Primary Cardiologist: New   Patient Profile:   Jeremiah Keith is a 32 y.o. male with a hx of uncontrolled hypertension who is being seen today for the evaluation of chest pain and elevated troponin at the request of Dr. Liz Beach.  History of Present Illness:   Jeremiah Keith is a 32 year old male with known history of severe hypertension at an early age.  He is followed by Dr. Damita Dunnings but has not seen him since 2017.  The patient was supposed to be on antihypertensive medications but he stopped taking them and did not resume.  Most recent documented office blood pressure was 184/102,017.  He also had an emergency room visit in September 2017 with hypertensive urgency. He does have history of cocaine use but not recently.  He presented with worsening chest pain and shortness of breath.  The chest pain is substernal described as squeezing feeling with no radiation to his back.  It was severe in intensity and associated with significant dyspnea.  Thus, he came to the emergency room and he was noted to have a blood pressure of 240/140.  He was started on antihypertensive medications but he had some recurrent chest pain in the afternoon that subsided with nitroglycerin drip.  He is currently pain-free.  He was found to have mildly elevated troponin but that has not trended up.  He had an echocardiogram done which showed normal LV systolic function and wall motion.  He was also found to have acute renal failure with a creatinine of 3.  Past Medical History:  Diagnosis Date  . Depression    after death of family members  . History of chicken pox   . Hypertension   . Substance abuse ([place])    h/o cocaine abuse    Past Surgical History:  Procedure Laterality Date  . TONSILLECTOMY AND ADENOIDECTOMY  1990      Home Medications:  Prior to Admission medications   Not on File    Inpatient Medications: Scheduled Meds: . amLODipine  10 mg Oral Daily  . aspirin  81 mg Oral Daily  . carvedilol  12.5 mg Oral BID WC  . phosphorus  500 mg Oral Q4H   Continuous Infusions: . heparin 1,300 Units/hr (11/01/17 1455)  . niCARDipine    . nitroGLYCERIN    . nitroGLYCERIN     PRN Meds: acetaminophen **OR** acetaminophen, bisacodyl, ondansetron **OR** ondansetron (ZOFRAN) IV, senna-docusate  Allergies:    Allergies  Allergen Reactions  . Penicillins Rash    Has patient had a PCN reaction causing immediate rash, facial/tongue/throat swelling, SOB or lightheadedness with hypotension: Yes Has patient had a PCN reaction causing severe rash involving mucus membranes or skin necrosis: No Has patient had a PCN reaction that required hospitalization: No Has patient had a PCN reaction occurring within the last 10 years: No If all of the above answers are "NO", then may proceed with Cephalosporin use.     Social History:   Social History   Socioeconomic History  . Marital status: Married    Spouse name: Not on file  . Number of children: Not on file  . Years of education: 72  . Highest education level: Not on file  Occupational History  . Not on file  Social Needs  . Financial resource strain: Not on file  . Food insecurity:  Worry: Not on file    Inability: Not on file  . Transportation needs:    Medical: Not on file    Non-medical: Not on file  Tobacco Use  . Smoking status: Current Every Day Smoker    Packs/day: 25.00    Types: Cigarettes  . Smokeless tobacco: Never Used  Substance and Sexual Activity  . Alcohol use: Yes    Alcohol/week: 0.0 standard drinks  . Drug use: Not Currently  . Sexual activity: Not on file  Lifestyle  . Physical activity:    Days per week: Not on file    Minutes per session: Not on file  . Stress: Not on file  Relationships  . Social connections:     Talks on phone: Not on file    Gets together: Not on file    Attends religious service: Not on file    Active member of club or organization: Not on file    Attends meetings of clubs or organizations: Not on file    Relationship status: Not on file  . Intimate partner violence:    Fear of current or ex partner: Not on file    Emotionally abused: Not on file    Physically abused: Not on file    Forced sexual activity: Not on file  Other Topics Concern  . 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 Heart disease Mother   . Diabetes Father   . Hypertension Father   . Prostate cancer Maternal Grandfather        possible prostate cancer  . Colon cancer Neg Hx      ROS:  Please see the history of present illness.   All other ROS reviewed and negative.     Physical Exam/Data:   Vitals:   11/01/17 1200 11/01/17 1300 11/01/17 1400 11/01/17 1500  BP: (!) 192/101 (!) 155/63 (!) 156/95 (!) 171/101  Pulse: (!) 101 95 94 95  Resp: (!) 23 (!) 21 (!) 29 (!) 26  Temp: 98.4 F (36.9 C)     TempSrc: Oral     SpO2: 95% 97% 97% (!) 89%  Weight:      Height:        Intake/Output Summary (Last 24 hours) at 11/01/2017 1646 Last data filed at 11/01/2017 0715 Gross per 24 hour  Intake -  Output 1350 ml  Net -1350 ml   Filed Weights   11/01/17 0349  Weight: 102.1 kg   Body mass index is 36.32 kg/m.  General:  Well nourished, well developed, in no acute distress HEENT: normal Lymph: no adenopathy Neck: no JVD Endocrine:  No thryomegaly Vascular: No carotid bruits; FA pulses 2+ bilaterally without bruits  Cardiac:  normal S1, S2; RRR; no murmur  Lungs:  clear to auscultation bilaterally, no wheezing, rhonchi or rales  Abd: soft, nontender, no hepatomegaly  Ext: no edema Musculoskeletal:  No deformities, BUE and BLE strength  normal and equal Skin: warm and dry  Neuro:  CNs 2-12 intact, no focal abnormalities noted Psych:  Normal affect   EKG:  The EKG was personally reviewed and demonstrates: Sinus tachycardia with right bundle branch block, LVH with repolarization abnormalities. Telemetry:  Telemetry was personally reviewed and demonstrates: Sinus rhythm with intermittent sinus tachycardia.  Relevant CV Studies:  Echo was personally reviewed by me and done today: - Left ventricle: The cavity size was normal. There was moderate   concentric hypertrophy. Systolic function was normal. The   estimated ejection fraction was in the range of 55% to 60%. Wall   motion was normal; there were no regional wall motion   abnormalities. - Aorta: Aortic root dimension: 42 mm (ED). - Aortic root: The aortic root was mildly dilated. - Left atrium: The atrium was mildly dilated. - Right atrium: The atrium was mildly dilated. - Pulmonary arteries: Systolic pressure could not be accurately   estimated.  Laboratory Data:  Chemistry Recent Labs  Lab November 16, 2017 0350  NA 139  K 3.0*  CL 101  CO2 29  GLUCOSE 130*  BUN 36*  CREATININE 3.16*  CALCIUM 9.0  GFRNONAA 24*  GFRAA 28*  ANIONGAP 9    No results for input(s): PROT, ALBUMIN, AST, ALT, ALKPHOS, BILITOT in the last 168 hours. Hematology Recent Labs  Lab November 16, 2017 0350  WBC 14.8*  RBC 4.06*  HGB 12.6*  HCT 35.2*  MCV 86.8  MCH 31.2  MCHC 35.9  RDW 13.6  PLT 217   Cardiac Enzymes Recent Labs  Lab Nov 16, 2017 0350 Nov 16, 2017 0650 11/16/2017 0930  TROPONINI 0.19* 0.22* 0.17*   No results for input(s): TROPIPOC in the last 168 hours.  BNP Recent Labs  Lab 11-16-17 0350  BNP 1,384.0*    DDimer No results for input(s): DDIMER in the last 168 hours.  Radiology/Studies:  US Renal  Result Date: 11-16-2017 CLINICAL DATA:  Initial evaluation for acute renal injury. EXAM: RENAL / URINARY TRACT ULTRASOUND COMPLETE COMPARISON:  Prior CT from 10/09/2012.  FINDINGS: Right Kidney: Length: 10.2 cm. Echogenicity within normal limits. No mass or hydronephrosis visualized. Left Kidney: Length: 9.3 cm. Echogenicity within normal limits. No mass or hydronephrosis visualized. Bladder: Appears normal for degree of bladder distention. Incidental note made of a left pleural effusion. IMPRESSION: 1. Normal renal ultrasound.  No hydronephrosis. 2. Left pleural effusion. Electronically Signed   By: Jeannine Boga M.D.   On: Nov 16, 2017 06:59   Dg Chest Portable 1 View  Result Date: 11/16/17 CLINICAL DATA:  Shortness of breath for 3 days, worse tonight. Tightness in the chest. Clammy. EXAM: PORTABLE CHEST 1 VIEW COMPARISON:  12/08/2015 FINDINGS: Cardiac enlargement with pulmonary vascular congestion. Basilar interstitial changes likely representing early interstitial edema. Possible small left pleural effusion. No consolidation. No pneumothorax. Mediastinal contours appear intact. IMPRESSION: Cardiac enlargement with pulmonary vascular congestion and mild interstitial edema. Possible small left pleural effusion. Electronically Signed   By: Lucienne Capers M.D.   On: November 16, 2017 04:29    Assessment and Plan:   1. Hypertensive urgency with evidence of endorgan damage: I agree with blood pressure control.  He was started on amlodipine and carvedilol with subsequent improvement in blood pressure.  He feels better. 2. Mildly elevated troponin with chest pain: I suspect is likely due to demand ischemia.  If troponin does not go up, heparin drip can be discontinued.  Obviously, with significantly elevated blood pressure, aortic dissection is in the differential diagnosis.  However, the quality of the pain is not consistent with that with no radiation to his back.  He also has normal and equal pulses throughout and his pain has resolved with blood pressure control.  The patient will require further ischemic work-up likely with a Lexiscan Myoview once his blood pressure is  controlled.  We will try to avoid any invasive procedures that require contrast given  acute renal failure. 3. Acute renal failure: Likely due to hypertensive urgency.  Nephrology consulted.   For questions or updates, please contact [place] Please consult [email or web address] for contact info under Cardiology/STEMI.   Signed, Kathlyn Sacramento, MD  11/01/2017 4:46 PM

## 2017-11-01 NOTE — ED Notes (Signed)
MD Dahlia Client informed of pt's troponin level.

## 2017-11-01 NOTE — ED Triage Notes (Signed)
Pt brought to treatment room 2 via wheelchair; Dr Dahlia Client immediately at bedside; pt reports feeling short of breath for the last 3 days, worsening quickly tonight; tightness across the center of his chest; getting winded by the end of a sentences; pt clammy; unable to lay flat, feels best sitting up straight;

## 2017-11-02 DIAGNOSIS — I248 Other forms of acute ischemic heart disease: Secondary | ICD-10-CM

## 2017-11-02 DIAGNOSIS — N179 Acute kidney failure, unspecified: Secondary | ICD-10-CM

## 2017-11-02 LAB — BASIC METABOLIC PANEL
Anion gap: 7 (ref 5–15)
BUN: 44 mg/dL — AB (ref 6–20)
CALCIUM: 8.9 mg/dL (ref 8.9–10.3)
CO2: 29 mmol/L (ref 22–32)
CREATININE: 3.05 mg/dL — AB (ref 0.61–1.24)
Chloride: 100 mmol/L (ref 98–111)
GFR calc Af Amer: 29 mL/min — ABNORMAL LOW (ref >60)
GFR, EST NON AFRICAN AMERICAN: 25 mL/min — AB (ref >60)
Glucose, Bld: 127 mg/dL — ABNORMAL HIGH (ref 70–99)
Potassium: 3.4 mmol/L — ABNORMAL LOW (ref 3.5–5.1)
Sodium: 136 mmol/L (ref 135–145)

## 2017-11-02 LAB — CBC
HCT: 28.8 % — ABNORMAL LOW (ref 40.0–52.0)
HEMOGLOBIN: 10.3 g/dL — AB (ref 13.0–18.0)
MCH: 31.2 pg (ref 26.0–34.0)
MCHC: 35.9 g/dL (ref 32.0–36.0)
MCV: 87.1 fL (ref 80.0–100.0)
Platelets: 199 10*3/uL (ref 150–440)
RBC: 3.31 MIL/uL — ABNORMAL LOW (ref 4.40–5.90)
RDW: 13.8 % (ref 11.5–14.5)
WBC: 22.4 10*3/uL — ABNORMAL HIGH (ref 3.8–10.6)

## 2017-11-02 LAB — PHOSPHORUS: PHOSPHORUS: 5.4 mg/dL — AB (ref 2.5–4.6)

## 2017-11-02 LAB — UREA NITROGEN, URINE: UREA NITROGEN UR: 122 mg/dL

## 2017-11-02 LAB — HIV ANTIBODY (ROUTINE TESTING W REFLEX): HIV SCREEN 4TH GENERATION: NONREACTIVE

## 2017-11-02 MED ORDER — HYDRALAZINE HCL 50 MG PO TABS
25.0000 mg | ORAL_TABLET | Freq: Three times a day (TID) | ORAL | Status: DC
  Administered 2017-11-02: 25 mg via ORAL
  Filled 2017-11-02: qty 1

## 2017-11-02 MED ORDER — HYDRALAZINE HCL 50 MG PO TABS: 50.0000 mg | ORAL_TABLET | Freq: Three times a day (TID) | ORAL | Status: DC

## 2017-11-02 MED ORDER — HYDRALAZINE HCL 50 MG PO TABS
25.0000 mg | ORAL_TABLET | Freq: Once | ORAL | Status: AC
  Administered 2017-11-02: 25 mg via ORAL
  Filled 2017-11-02: qty 1

## 2017-11-02 MED ORDER — NICARDIPINE HCL IN NACL 20-0.86 MG/200ML-% IV SOLN
3.0000 mg/h | INTRAVENOUS | Status: DC
  Administered 2017-11-02 – 2017-11-03 (×3): 3 mg/h via INTRAVENOUS
  Filled 2017-11-02 (×4): qty 200

## 2017-11-02 MED ORDER — HYDRALAZINE HCL 50 MG PO TABS
50.0000 mg | ORAL_TABLET | Freq: Three times a day (TID) | ORAL | Status: DC
  Administered 2017-11-02 – 2017-11-03 (×3): 50 mg via ORAL
  Filled 2017-11-02 (×3): qty 1

## 2017-11-02 MED ORDER — POTASSIUM CHLORIDE CRYS ER 20 MEQ PO TBCR
20.0000 meq | EXTENDED_RELEASE_TABLET | Freq: Once | ORAL | Status: AC
  Administered 2017-11-02: 20 meq via ORAL
  Filled 2017-11-02: qty 1

## 2017-11-02 NOTE — Progress Notes (Signed)
[facility] NAME: Jeremiah Keith    MR#:  [id number]  DATE OF BIRTH:  [birth year]  SUBJECTIVE:  CHIEF COMPLAINT:   Chief Complaint  Patient presents with  . Respiratory Distress  Patient still requiring Cardene drip for blood pressure control and discussion with intensivist  REVIEW OF SYSTEMS:  CONSTITUTIONAL: No fever, fatigue or weakness.  EYES: No blurred or double vision.  EARS, NOSE, AND THROAT: No tinnitus or ear pain.  RESPIRATORY: No cough, shortness of breath, wheezing or hemoptysis.  CARDIOVASCULAR: No chest pain, orthopnea, edema.  GASTROINTESTINAL: No nausea, vomiting, diarrhea or abdominal pain.  GENITOURINARY: No dysuria, hematuria.  ENDOCRINE: No polyuria, nocturia,  HEMATOLOGY: No anemia, easy bruising or bleeding SKIN: No rash or lesion. MUSCULOSKELETAL: No joint pain or arthritis.   NEUROLOGIC: No tingling, numbness, weakness.  PSYCHIATRY: No anxiety or depression.   ROS  DRUG ALLERGIES:   Allergies  Allergen Reactions  . Penicillins Rash    Has patient had a PCN reaction causing immediate rash, facial/tongue/throat swelling, SOB or lightheadedness with hypotension: Yes Has patient had a PCN reaction causing severe rash involving mucus membranes or skin necrosis: No Has patient had a PCN reaction that required hospitalization: No Has patient had a PCN reaction occurring within the last 10 years: No If all of the above answers are "NO", then may proceed with Cephalosporin use.     VITALS:  Blood pressure (!) 146/85, pulse 88, temperature 98.3 F (36.8 C), temperature source Oral, resp. rate (!) 27, height 5\' 6"  (1.676 m), weight 102.1 kg, SpO2 95 %.  PHYSICAL EXAMINATION:  GENERAL:  32 y.o.-year-old patient lying in the bed with no acute distress.  EYES: Pupils equal, round, reactive to light and accommodation. No scleral icterus. Extraocular muscles intact.  HEENT: Head atraumatic,  normocephalic. Oropharynx and nasopharynx clear.  NECK:  Supple, no jugular venous distention. No thyroid enlargement, no tenderness.  LUNGS: Normal breath sounds bilaterally, no wheezing, rales,rhonchi or crepitation. No use of accessory muscles of respiration.  CARDIOVASCULAR: S1, S2 normal. No murmurs, rubs, or gallops.  ABDOMEN: Soft, nontender, nondistended. Bowel sounds present. No organomegaly or mass.  EXTREMITIES: No pedal edema, cyanosis, or clubbing.  NEUROLOGIC: Cranial nerves II through XII are intact. Muscle strength 5/5 in all extremities. Sensation intact. Gait not checked.  PSYCHIATRIC: The patient is alert and oriented x 3.  SKIN: No obvious rash, lesion, or ulcer.   Physical Exam LABORATORY PANEL:   CBC Recent Labs  Lab 11/02/17 0242  WBC 22.4*  HGB 10.3*  HCT 28.8*  PLT 199   ------------------------------------------------------------------------------------------------------------------  Chemistries  Recent Labs  Lab 11/01/17 0650 11/02/17 0242  NA  --  136  K  --  3.4*  CL  --  100  CO2  --  29  GLUCOSE  --  127*  BUN  --  44*  CREATININE  --  3.05*  CALCIUM  --  8.9  MG 2.2  --    ------------------------------------------------------------------------------------------------------------------  Cardiac Enzymes Recent Labs  Lab 11/01/17 0650 11/01/17 0930  TROPONINI 0.22* 0.17*   ------------------------------------------------------------------------------------------------------------------  RADIOLOGY:  US Renal  Result Date: 11/01/2017 CLINICAL DATA:  Initial evaluation for acute renal injury. EXAM: RENAL / URINARY TRACT ULTRASOUND COMPLETE COMPARISON:  Prior CT from 10/09/2012. FINDINGS: Right Kidney: Length: 10.2 cm. Echogenicity within normal limits. No mass or hydronephrosis visualized. Left Kidney: Length: 9.3 cm. Echogenicity within normal limits. No mass or hydronephrosis visualized. Bladder: Appears normal for degree  of bladder  distention. Incidental note made of a left pleural effusion. IMPRESSION: 1. Normal renal ultrasound.  No hydronephrosis. 2. Left pleural effusion. Electronically Signed   By: Jeannine Boga M.D.   On: 11/01/2017 06:59   Dg Chest Portable 1 View  Result Date: 11/01/2017 CLINICAL DATA:  Shortness of breath for 3 days, worse tonight. Tightness in the chest. Clammy. EXAM: PORTABLE CHEST 1 VIEW COMPARISON:  12/08/2015 FINDINGS: Cardiac enlargement with pulmonary vascular congestion. Basilar interstitial changes likely representing early interstitial edema. Possible small left pleural effusion. No consolidation. No pneumothorax. Mediastinal contours appear intact. IMPRESSION: Cardiac enlargement with pulmonary vascular congestion and mild interstitial edema. Possible small left pleural effusion. Electronically Signed   By: Lucienne Capers M.D.   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 Management plans discussed with the patient, family and they are in agreement.  CODE STATUS: Full  TOTAL TIME TAKING CARE OF THIS PATIENT: 35 minutes.     POSSIBLE D/C IN 2-3 DAYS, DEPENDING ON  CLINICAL CONDITION.   Avel Peace Grant Swager M.D on 11/02/2017   Between 7am to 6pm - Pager - [phone]  After 6pm go to [email or web address] - password [email or web address] [facility]  Office  [phone]  CC: Primary care physician; [name], MD  Note: This dictation was prepared with Dragon dictation along with smaller phrase technology. Any transcriptional errors that result from this process are unintentional.

## 2017-11-02 NOTE — Progress Notes (Signed)
Follow up - Critical Care Medicine Note  Patient Details:    Jeremiah Keith is an 32 y.o. male.with a past medical history remarkable for substance abuse, depression, with a known history of hypertension but has not been interested in therapy, presented to the emergency department with episode of shortness of breath. He's also been having some intermittent chest pain since Thursday also with blurry vision. In emergency department his initial blood pressure was 244/144. He was started on a Cardene infusion along with nitroglycerin was subsequently transferred to the intensive care unit.  Lines, Airways, Drains:    Anti-infectives:  Anti-infectives (From admission, onward)   None      Microbiology: No results found for this or any previous visit. Studies: US Renal  Result Date: 11/01/2017 CLINICAL DATA:  Initial evaluation for acute renal injury. EXAM: RENAL / URINARY TRACT ULTRASOUND COMPLETE COMPARISON:  Prior CT from 10/09/2012. FINDINGS: Right Kidney: Length: 10.2 cm. Echogenicity within normal limits. No mass or hydronephrosis visualized. Left Kidney: Length: 9.3 cm. Echogenicity within normal limits. No mass or hydronephrosis visualized. Bladder: Appears normal for degree of bladder distention. Incidental note made of a left pleural effusion. IMPRESSION: 1. Normal renal ultrasound.  No hydronephrosis. 2. Left pleural effusion. Electronically Signed   By: Jeannine Boga M.D.   On: 11/01/2017 06:59   Dg Chest Portable 1 View  Result Date: 11/01/2017 CLINICAL DATA:  Shortness of breath for 3 days, worse tonight. Tightness in the chest. Clammy. EXAM: PORTABLE CHEST 1 VIEW COMPARISON:  12/08/2015 FINDINGS: Cardiac enlargement with pulmonary vascular congestion. Basilar interstitial changes likely representing early interstitial edema. Possible small left pleural effusion. No consolidation. No pneumothorax. Mediastinal contours appear intact. IMPRESSION: Cardiac enlargement with  pulmonary vascular congestion and mild interstitial edema. Possible small left pleural effusion. Electronically Signed   By: Lucienne Capers M.D.   On: 11/01/2017 04:29    Consults: Treatment Team:  Arta Silence, MD Anthonette Legato, MD Wellington Hampshire, MD   Subjective:    Overnight Issues: overnight he has still required Cardene infusion and nitroglycerin infusion. His antihypertensives have been adjusted to include Norvasc, labetalol and hydralazine. Attempting to wean off of Cardene and nitroglycerin.  Objective:  Vital signs for last 24 hours: Temp:  [97.8 F (36.6 C)-98.6 F (37 C)] 98.2 F (36.8 C) (08/20 0800) Pulse Rate:  [93-110] 97 (08/20 0800) Resp:  [13-29] 18 (08/20 0800) BP: (144-207)/(63-128) 173/108 (08/20 0800) SpO2:  [88 %-97 %] 93 % (08/20 0800)  Hemodynamic parameters for last 24 hours:    Intake/Output from previous day: 08/19 0701 - 08/20 0700 In: 1224.9 [I.V.:1224.9] Out: 2250 [Urine:2250]  Intake/Output this shift: Total I/O In: 120 [P.O.:120] Out: 600 [Urine:600]  Vent settings for last 24 hours:    Physical Exam:  Vital signs:       Please see the above listed vital signs. Patient is still hypertensive on Cardene and glycerin HEENT:           Trachea midline, no oral lesions appreciated, jugular venous distention noted Cardiovascular:           Regular rate and rhythm with S4, systolic murmur left parasternal border Pulmonary:      Clear to auscultation Abdominal:      Positive bowel sounds, soft nontender no palpable megaly Extremities:     No clubbing cyanosis or edema noted Neurologic:      No clear focal deficits noted Cutaneous:      No rashes or lesions noted   Assessment/Plan:  Hypertensive emergency with any evidence of end organ damage to include renal disease, positive troponin, abnormal EKG along with visual disturbances. Patient is presently on Cardene and nitroglycerin, ation is presently on Norvasc, labetalol and  hydralazine. Will wean antihypertensive drips as tolerated.  Renal failure. On improving slowly with pressure control  Echocardiogram revealed preserved LV function with concentric hypertrophy   Dainelle Hun 11/02/2017  *Care during the described time interval was provided by me and/or other providers on the critical care team.  I have reviewed this patient's available data, including medical history, events of note, physical examination and test results as part of my evaluation.

## 2017-11-02 NOTE — Progress Notes (Signed)
[facility]  ROUNDING NOTE   Subjective:  Patient remains on nitroglycerin and nicardipine drip. Blood pressure has improved as compared to admission. Currently denies shortness of breath or chest pain.  Objective:  Vital signs in last 24 hours:  Temp:  [97.8 F (36.6 C)-98.6 F (37 C)] 98 F (36.7 C) (08/20 0400) Pulse Rate:  [93-110] 97 (08/20 0800) Resp:  [13-30] 18 (08/20 0800) BP: (144-212)/(63-128) 173/108 (08/20 0800) SpO2:  [88 %-98 %] 93 % (08/20 0800)  Weight change:  Filed Weights   11/01/17 0349  Weight: 102.1 kg    Intake/Output: I/O last 3 completed shifts: In: 1224.9 [I.V.:1224.9] Out: 3100 [Urine:3100]   Intake/Output this shift:  No intake/output data recorded.  Physical Exam: General: No acute distress  Head: Normocephalic, atraumatic. Moist oral mucosal membranes  Eyes: Anicteric  Neck: Supple, trachea midline  Lungs:  Clear to auscultation, normal effort  Heart: S1S2 no rubs  Abdomen:  Soft, nontender, bowel sounds present  Extremities: No peripheral edema.  Neurologic: Awake, alert, following commands  Skin: No lesions       Basic Metabolic Panel: Recent Labs  Lab 11/01/17 0350 11/01/17 0650 11/02/17 0242  NA 139  --  136  K 3.0*  --  3.4*  CL 101  --  100  CO2 29  --  29  GLUCOSE 130*  --  127*  BUN 36*  --  44*  CREATININE 3.16*  --  3.05*  CALCIUM 9.0  --  8.9  MG  --  2.2  --   PHOS  --  1.5* 5.4*    Liver Function Tests: No results for input(s): AST, ALT, ALKPHOS, BILITOT, PROT, ALBUMIN in the last 168 hours. No results for input(s): LIPASE, AMYLASE in the last 168 hours. No results for input(s): AMMONIA in the last 168 hours.  CBC: Recent Labs  Lab 11/01/17 0350 11/02/17 0242  WBC 14.8* 22.4*  HGB 12.6* 10.3*  HCT 35.2* 28.8*  MCV 86.8 87.1  PLT 217 199    Cardiac Enzymes: Recent Labs  Lab 11/01/17 0350 11/01/17 0650 11/01/17 0930  TROPONINI 0.19* 0.22* 0.17*    BNP: Invalid input(s):  POCBNP  CBG: Recent Labs  Lab 11/01/17 0756  GLUCAP 192*    Microbiology: No results found for this or any previous visit.  Coagulation Studies: Recent Labs    11/01/17 0650  LABPROT 14.5  INR 1.14    Urinalysis: Recent Labs    11/01/17 0650  COLORURINE COLORLESS*  LABSPEC 1.005  PHURINE 6.0  GLUCOSEU 50*  HGBUR NEGATIVE  BILIRUBINUR NEGATIVE  KETONESUR NEGATIVE  PROTEINUR NEGATIVE  NITRITE NEGATIVE  LEUKOCYTESUR NEGATIVE      Imaging: US Renal  Result Date: 11/01/2017 CLINICAL DATA:  Initial evaluation for acute renal injury. EXAM: RENAL / URINARY TRACT ULTRASOUND COMPLETE COMPARISON:  Prior CT from 10/09/2012. FINDINGS: Right Kidney: Length: 10.2 cm. Echogenicity within normal limits. No mass or hydronephrosis visualized. Left Kidney: Length: 9.3 cm. Echogenicity within normal limits. No mass or hydronephrosis visualized. Bladder: Appears normal for degree of bladder distention. Incidental note made of a left pleural effusion. IMPRESSION: 1. Normal renal ultrasound.  No hydronephrosis. 2. Left pleural effusion. Electronically Signed   By: Jeannine Boga M.D.   On: 11/01/2017 06:59   Dg Chest Portable 1 View  Result Date: 11/01/2017 CLINICAL DATA:  Shortness of breath for 3 days, worse tonight. Tightness in the chest. Clammy. EXAM: PORTABLE CHEST 1 VIEW COMPARISON:  12/08/2015 FINDINGS: Cardiac enlargement with pulmonary  vascular congestion. Basilar interstitial changes likely representing early interstitial edema. Possible small left pleural effusion. No consolidation. No pneumothorax. Mediastinal contours appear intact. IMPRESSION: Cardiac enlargement with pulmonary vascular congestion and mild interstitial edema. Possible small left pleural effusion. Electronically Signed   By: Lucienne Capers M.D.   On: 11/01/2017 04:29     Medications:   . niCARDipine Stopped (11/02/17 0800)  . nitroGLYCERIN 155 mcg/min (11/02/17 0600)   . amLODipine  10 mg Oral Daily   . aspirin  81 mg Oral Daily  . hydrALAZINE  25 mg Oral Q8H  . labetalol  100 mg Oral BID   acetaminophen **OR** acetaminophen, bisacodyl, ondansetron **OR** ondansetron (ZOFRAN) IV, senna-docusate  Assessment/ Plan:  32 y.o. male with a PMHx of hypertension, history of substance abuse, depression, who was admitted to [facility] on 11/01/2017 for evaluation of significant shortness of breath.    1.  Acute renal failure. 2.  Proteinuria. 3.  Hypertension, severe, not on outpatient medications.  4.  Chest pain with shortness of breath.   Plan:  The patient's renal function is about the same.  Suspect that he may have some underlying chronic kidney disease.  Urine protein to creatinine ratio came back at 0.5.  Ideally we would like to add an ACE inhibitor or ARB but hold off for now until we have a more established baseline creatinine.  He being weaned off of nicardipine.  He still remains on a nitroglycerin drip.  We will add hydralazine 25 mg p.o. every 8 hours.  Continue amlodipine and labetalol as well.  Further plan as patient progresses.   LOS: 1 Aadin Gaut 8/20/20198:03 AM

## 2017-11-02 NOTE — Consult Note (Signed)
PHARMACY CONSULT NOTE - INITIAL   Pharmacy Consult for Electrolyte Monitoring and Replacement   Labs: Potassium (mmol/L)  Date Value  11/02/2017 3.4 (L)  04/12/2014 3.9   Magnesium (mg/dL)  Date Value  11/01/2017 2.2   Calcium (mg/dL)  Date Value  11/02/2017 8.9   Calcium, Total (mg/dL)  Date Value  04/12/2014 9.0   Phosphorus (mg/dL)  Date Value  11/02/2017 5.4 (H)   Albumin (g/dL)  Date Value  04/12/2014 4.3  ] Estimated Creatinine Clearance: 38.5 mL/min (A) (by C-G formula based on SCr of 3.05 mg/dL (H)).  Assessment: Pharmacy consulted for electrolyte monitoring and replacement in 32yo male admitted with Hypertensive urgency/emergency, elevated troponin,  pulmonary edema, and AKI. Patient had hypokalemia and hypophosphatemia on admission.   Goal of Therapy:  Electrolytes WNL  Plan:  8/20 KCL 82mEq x 1 dose ordered. No additional supplementation needed at this time.   Recheck electrolytes with AM labs   Pharmacy will continue to replace as needed.   Pernell Dupre, PharmD, BCPS Clinical Pharmacist 11/02/2017 12:23 PM

## 2017-11-02 NOTE — Progress Notes (Signed)
Progress Note  Patient Name: Jeremiah Keith Date of Encounter: 11/02/2017  Primary Cardiologist: New to [facility] - consult by Fletcher Anon  Subjective   Feels "100% better" this morning. No chest pain or SOB. Mild cough this morning. BP improved to the 160s/90s most recent per monitor. Troponin peaked at 0.22, now down trending. Has eaten this morning.   Inpatient Medications    Scheduled Meds: . amLODipine  10 mg Oral Daily  . aspirin  81 mg Oral Daily  . hydrALAZINE  25 mg Oral Q8H  . labetalol  100 mg Oral BID   Continuous Infusions: . niCARDipine 3 mg/hr (11/02/17 0738)  . nitroGLYCERIN 155 mcg/min (11/02/17 0600)   PRN Meds: acetaminophen **OR** acetaminophen, bisacodyl, ondansetron **OR** ondansetron (ZOFRAN) IV, senna-docusate   Vital Signs    Vitals:   11/02/17 0700 11/02/17 0730 11/02/17 0748 11/02/17 0800  BP: (!) 169/84 (!) 198/95 (!) 148/98 (!) 173/108  Pulse: 99 (!) 101 (!) 105 97  Resp: (!) 24 20 (!) 26 18  Temp:      TempSrc:      SpO2: 90% 92% (!) 89% 93%  Weight:      Height:        Intake/Output Summary (Last 24 hours) at 11/02/2017 0817 Last data filed at 11/02/2017 0804 Gross per 24 hour  Intake 1224.91 ml  Output 2350 ml  Net -1125.09 ml   Filed Weights   11/01/17 0349  Weight: 102.1 kg    Telemetry    NSR to sinus tachycardia in the 90s to 110s bpm - Personally Reviewed  ECG    n/a - Personally Reviewed  Physical Exam   GEN: No acute distress.   Neck: No JVD. Cardiac: RRR, no murmurs, rubs, or gallops.  Respiratory: Clear to auscultation bilaterally.  GI: Soft, nontender, non-distended.   MS: No edema; No deformity. Neuro:  Alert and oriented x 3; Nonfocal.  Psych: Normal affect.  Labs    Chemistry Recent Labs  Lab 11/01/17 0350 11/02/17 0242  NA 139 136  K 3.0* 3.4*  CL 101 100  CO2 29 29  GLUCOSE 130* 127*  BUN 36* 44*  CREATININE 3.16* 3.05*  CALCIUM 9.0 8.9  GFRNONAA 24* 25*  GFRAA 28* 29*  ANIONGAP 9 7       Hematology Recent Labs  Lab 11/01/17 0350 11/02/17 0242  WBC 14.8* 22.4*  RBC 4.06* 3.31*  HGB 12.6* 10.3*  HCT 35.2* 28.8*  MCV 86.8 87.1  MCH 31.2 31.2  MCHC 35.9 35.9  RDW 13.6 13.8  PLT 217 199    Cardiac Enzymes Recent Labs  Lab 11/01/17 0350 11/01/17 0650 11/01/17 0930  TROPONINI 0.19* 0.22* 0.17*   No results for input(s): TROPIPOC in the last 168 hours.   BNP Recent Labs  Lab 11/01/17 0350  BNP 1,384.0*     DDimer No results for input(s): DDIMER in the last 168 hours.   Radiology    US Renal  Result Date: 11/01/2017 IMPRESSION: 1. Normal renal ultrasound.  No hydronephrosis. 2. Left pleural effusion. Electronically Signed   By: Jeannine Boga M.D.   On: 11/01/2017 06:59   Dg Chest Portable 1 View  Result Date: 11/01/2017 IMPRESSION: Cardiac enlargement with pulmonary vascular congestion and mild interstitial edema. Possible small left pleural effusion. Electronically Signed   By: Lucienne Capers M.D.   On: 11/01/2017 04:29    Cardiac Studies   Echo 11/01/2017: Study Conclusions  - Left ventricle: The cavity size was normal.  There was moderate   concentric hypertrophy. Systolic function was normal. The   estimated ejection fraction was in the range of 55% to 60%. Wall   motion was normal; there were no regional wall motion   abnormalities. - Aorta: Aortic root dimension: 42 mm (ED). - Aortic root: The aortic root was mildly dilated. - Left atrium: The atrium was mildly dilated. - Right atrium: The atrium was mildly dilated. - Pulmonary arteries: Systolic pressure could not be accurately   estimated. __________  Patient Profile     32 y.o. male with history of uncontrolled HTN who was admitted to [facility] with hypertensive urgency with end organ damage and demand ischemia.   Assessment & Plan    1. Hypertensive urgency with end organ damage: -BP is improving, though remains elevated into the 797K systolic at this time -Remains on  nitro and nicardipine gtts, taper as able -Increase hydralazine to 50 mg q 8 hours -Continue Norvasc 10 mg daily an labetalol 100 mg bid -Not on ACEi/ARB at this time given ARF  2. Elevated troponin: -Mildly elevated and flat trending with a peak of 0.22 -Likely supply demand ischemia in the setting of his hypertensive urgency and ARF -Echo demonstrated a preserved LVSF with an EF of 55-60% and normal wall motion -Plan for nuclear stress testing as his BP improves, potentially on 8/21 -Will make NPO after midnight in preparation for stress testing on 8/21 -No plans for LHC at this time given improvement in symptoms with BP improvement and ARF -Heparin gtt has been stopped   3. ARF: -Secondary to #1 -Nephrology on board -Avoid nephro-toxic agents  For questions or updates, please contact [place] Please consult [email or web address] for contact info under Cardiology/STEMI.    Signed, Christell Faith, PA-C [place] Pager: [phone] 11/02/2017, 8:17 AM

## 2017-11-03 ENCOUNTER — Inpatient Hospital Stay: Payer: Self-pay

## 2017-11-03 ENCOUNTER — Inpatient Hospital Stay (HOSPITAL_BASED_OUTPATIENT_CLINIC_OR_DEPARTMENT_OTHER): Payer: Self-pay

## 2017-11-03 DIAGNOSIS — I161 Hypertensive emergency: Secondary | ICD-10-CM

## 2017-11-03 LAB — PHOSPHORUS: PHOSPHORUS: 4.6 mg/dL (ref 2.5–4.6)

## 2017-11-03 LAB — NM MYOCAR MULTI W/SPECT W/WALL MOTION / EF
CSEPHR: 59 %
CSEPPHR: 112 {beats}/min
LV dias vol: 320 mL (ref 62–150)
LV sys vol: 178 mL
Rest HR: 100 {beats}/min
TID: 0.94

## 2017-11-03 LAB — BASIC METABOLIC PANEL
ANION GAP: 7 (ref 5–15)
BUN: 39 mg/dL — ABNORMAL HIGH (ref 6–20)
CALCIUM: 9 mg/dL (ref 8.9–10.3)
CO2: 28 mmol/L (ref 22–32)
Chloride: 104 mmol/L (ref 98–111)
Creatinine, Ser: 2.68 mg/dL — ABNORMAL HIGH (ref 0.61–1.24)
GFR calc non Af Amer: 30 mL/min — ABNORMAL LOW (ref >60)
GFR, EST AFRICAN AMERICAN: 34 mL/min — AB (ref >60)
Glucose, Bld: 103 mg/dL — ABNORMAL HIGH (ref 70–99)
Potassium: 3.2 mmol/L — ABNORMAL LOW (ref 3.5–5.1)
Sodium: 139 mmol/L (ref 135–145)

## 2017-11-03 LAB — MAGNESIUM: Magnesium: 2.1 mg/dL (ref 1.7–2.4)

## 2017-11-03 MED ORDER — CARVEDILOL 12.5 MG PO TABS
12.5000 mg | ORAL_TABLET | Freq: Two times a day (BID) | ORAL | Status: DC
  Administered 2017-11-03 (×2): 12.5 mg via ORAL
  Filled 2017-11-03 (×3): qty 1

## 2017-11-03 MED ORDER — TECHNETIUM TC 99M TETROFOSMIN IV KIT
13.3200 | PACK | Freq: Once | INTRAVENOUS | Status: AC | PRN
  Administered 2017-11-03: 13.32 via INTRAVENOUS

## 2017-11-03 MED ORDER — ALPRAZOLAM 0.5 MG PO TABS
0.5000 mg | ORAL_TABLET | Freq: Once | ORAL | Status: AC
  Administered 2017-11-03: 0.5 mg via ORAL
  Filled 2017-11-03: qty 1

## 2017-11-03 MED ORDER — REGADENOSON 0.4 MG/5ML IV SOLN
0.4000 mg | Freq: Once | INTRAVENOUS | Status: AC
  Administered 2017-11-03: 0.4 mg via INTRAVENOUS
  Filled 2017-11-03: qty 5

## 2017-11-03 MED ORDER — HYDRALAZINE HCL 50 MG PO TABS
75.0000 mg | ORAL_TABLET | Freq: Three times a day (TID) | ORAL | Status: DC
  Administered 2017-11-03 (×2): 75 mg via ORAL
  Filled 2017-11-03 (×2): qty 2

## 2017-11-03 MED ORDER — TECHNETIUM TC 99M TETROFOSMIN IV KIT
31.8300 | PACK | Freq: Once | INTRAVENOUS | Status: AC | PRN
  Administered 2017-11-03: 31.83 via INTRAVENOUS

## 2017-11-03 MED ORDER — POTASSIUM CHLORIDE CRYS ER 20 MEQ PO TBCR
40.0000 meq | EXTENDED_RELEASE_TABLET | Freq: Once | ORAL | Status: AC
  Administered 2017-11-03: 40 meq via ORAL
  Filled 2017-11-03: qty 2

## 2017-11-03 MED ORDER — POTASSIUM CHLORIDE CRYS ER 20 MEQ PO TBCR: 20.0000 meq | EXTENDED_RELEASE_TABLET | ORAL | Status: DC

## 2017-11-03 NOTE — Care Management (Signed)
RNCM attempted again to meet with patient. He was asleep and this time his mother was at bedside.  She agreed and said that wife is completing paper work/application to [facility] and Med[facility] management.  Referral sent to both.  Assessment of patient need still pending.

## 2017-11-03 NOTE — Consult Note (Signed)
PHARMACY CONSULT NOTE - INITIAL   Pharmacy Consult for Electrolyte Monitoring and Replacement   Labs: Potassium (mmol/L)  Date Value  11/03/2017 3.2 (L)  04/12/2014 3.9   Magnesium (mg/dL)  Date Value  11/03/2017 2.1   Calcium (mg/dL)  Date Value  11/03/2017 9.0   Calcium, Total (mg/dL)  Date Value  04/12/2014 9.0   Phosphorus (mg/dL)  Date Value  11/03/2017 4.6   Albumin (g/dL)  Date Value  04/12/2014 4.3  ] Estimated Creatinine Clearance: 43.9 mL/min (A) (by C-G formula based on SCr of 2.68 mg/dL (H)).  Assessment: Pharmacy consulted for electrolyte monitoring and replacement in 32yo male admitted with Hypertensive urgency/emergency, elevated troponin,  pulmonary edema, and AKI. Patient had hypokalemia and hypophosphatemia on admission.   Goal of Therapy:  Electrolytes WNL  Plan:  8/20 KCL 56mEq x 1 dose ordered. No additional supplementation needed at this time.   8/21 AM K+ 3.2. 20 mEq PO x 2 doses ordered.  Recheck electrolytes with AM labs   Pharmacy will continue to replace as needed.   Eloise Harman, PharmD, BCPS Clinical Pharmacist 11/03/2017 6:31 AM

## 2017-11-03 NOTE — Progress Notes (Signed)
Follow up - Critical Care Medicine Note  Patient Details:    Jeremiah Keith is an 32 y.o. male.with a past medical history remarkable for substance abuse, depression, with a known history of hypertension but has not been interested in therapy, presented to the emergency department with episode of shortness of breath. He's also been having some intermittent chest pain since Thursday also with blurry vision. In emergency department his initial blood pressure was 244/144. He was started on a Cardene infusion along with nitroglycerin was subsequently transferred to the intensive care unit.  Lines, Airways, Drains:    Anti-infectives:  Anti-infectives (From admission, onward)   None      Microbiology: No results found for this or any previous visit. Studies: US Renal  Result Date: 11/01/2017 CLINICAL DATA:  Initial evaluation for acute renal injury. EXAM: RENAL / URINARY TRACT ULTRASOUND COMPLETE COMPARISON:  Prior CT from 10/09/2012. FINDINGS: Right Kidney: Length: 10.2 cm. Echogenicity within normal limits. No mass or hydronephrosis visualized. Left Kidney: Length: 9.3 cm. Echogenicity within normal limits. No mass or hydronephrosis visualized. Bladder: Appears normal for degree of bladder distention. Incidental note made of a left pleural effusion. IMPRESSION: 1. Normal renal ultrasound.  No hydronephrosis. 2. Left pleural effusion. Electronically Signed   By: Jeannine Boga M.D.   On: 11/01/2017 06:59   Dg Chest Portable 1 View  Result Date: 11/01/2017 CLINICAL DATA:  Shortness of breath for 3 days, worse tonight. Tightness in the chest. Clammy. EXAM: PORTABLE CHEST 1 VIEW COMPARISON:  12/08/2015 FINDINGS: Cardiac enlargement with pulmonary vascular congestion. Basilar interstitial changes likely representing early interstitial edema. Possible small left pleural effusion. No consolidation. No pneumothorax. Mediastinal contours appear intact. IMPRESSION: Cardiac enlargement with  pulmonary vascular congestion and mild interstitial edema. Possible small left pleural effusion. Electronically Signed   By: Lucienne Capers M.D.   On: 11/01/2017 04:29    Consults: Treatment Team:  Arta Silence, MD Anthonette Legato, MD Wellington Hampshire, MD   Subjective:    Overnight Issues:  Remains on Cardene infusion, nitroglycerin weaned off Oral antihypertensives: Hydralazine, Norvasc, Coreg (Hydralazine and Coreg titrated up per Cardiology) Pt underwent Nuclear Stress test and Renal Ultrasound today 8/21 Denies chest pain, palpitations, or SOB  Complains of mild headache On room air Wants to eat since returning from stress test  Objective:  Vital signs for last 24 hours: Temp:  [97.7 F (36.5 C)-98.6 F (37 C)] 98.6 F (37 C) (08/21 0200) Pulse Rate:  [88-101] 100 (08/21 0700) Resp:  [13-27] 13 (08/21 0700) BP: (136-181)/(80-112) 181/99 (08/21 0700) SpO2:  [91 %-99 %] 97 % (08/21 0700)  Hemodynamic parameters for last 24 hours:    Intake/Output from previous day: 08/20 0701 - 08/21 0700 In: 360 [P.O.:360] Out: 5900 [Urine:5900]  Intake/Output this shift: Total I/O In: -  Out: 500 [Urine:500]  Vent settings for last 24 hours:    Physical Exam:  Vital signs:       Please see the above listed vital signs. Patient is still hypertensive on Cardene gtt HEENT:           Atraumatic, normocephalic, neck supple, No JVD Cardiovascular:          RRR,s1s2, s4 noted,  Pulmonary:      Clear bilaterally, even, nonlababored, normal effort  Abdominal:      Soft, nontender, nondistended, BS+ x4 Extremities:     Normal bulk and tone, no deformities, no edema Neurologic:      A&O x4, No focal deficits Cutaneous:  Warm/dry.  No obvious rashes, lesions, or ulcerations   Assessment/Plan:   Hypertensive Emergency with evidence of end organ damage, including renal disease, Elevated troponin, abnormal EKG, and visual disturbances -Cardene and Nitroglycerin gtt's,  wean as tolerated -PO Norvasc, Coreg, Hydralazine (Hydralazine and Coreg titrated up per Cardiology) -Renal US 8/21, results pending -24 hr Urine Catecholamines / Metanephrines pending  AKI>>slowly improving Hypokalemia -Nephrology following, appreciate input -Monitor I&O's / urinary output -Follow BMP -Ensure adequate renal perfusion -Avoid nephrotoxic agents as able -Replace electrolytes as indicated -Potassium repleted 8/21, recheck BMP in am 8/22  Elevated Troponin, likely demand ischemia in setting of Hypertensive emergency -Cardiology following, appreciate input -Nuclear Stress test 8/21, results pending -ECHO with preserved LV function w/ concentric hypertrophy -Heparin gtt d/c per Cardiology    [name], [facility] Pager: [phone]  11/03/2017

## 2017-11-03 NOTE — Progress Notes (Addendum)
[place] [place] [facility] [place] NAME: Jeremiah Keith    MR#:  [id number]  DATE OF BIRTH:  1985/11/20  SUBJECTIVE:  CHIEF COMPLAINT:   Chief Complaint  Patient presents with  . Respiratory Distress  Patient has headache, still on Cardene drip.  REVIEW OF SYSTEMS:  CONSTITUTIONAL: No fever, fatigue or weakness.  But has headache. EYES: No blurred or double vision.  EARS, NOSE, AND THROAT: No tinnitus or ear pain.  RESPIRATORY: No cough, shortness of breath, wheezing or hemoptysis.  CARDIOVASCULAR: No chest pain, orthopnea, edema.  GASTROINTESTINAL: No nausea, vomiting, diarrhea or abdominal pain.  GENITOURINARY: No dysuria, hematuria.  ENDOCRINE: No polyuria, nocturia,  HEMATOLOGY: No anemia, easy bruising or bleeding SKIN: No rash or lesion. MUSCULOSKELETAL: No joint pain or arthritis.   NEUROLOGIC: No tingling, numbness, weakness.  PSYCHIATRY: No anxiety or depression.   ROS  DRUG ALLERGIES:   Allergies  Allergen Reactions  . Penicillins Rash    Has patient had a PCN reaction causing immediate rash, facial/tongue/throat swelling, SOB or lightheadedness with hypotension: Yes Has patient had a PCN reaction causing severe rash involving mucus membranes or skin necrosis: No Has patient had a PCN reaction that required hospitalization: No Has patient had a PCN reaction occurring within the last 10 years: No If all of the above answers are "NO", then may proceed with Cephalosporin use.     VITALS:  Blood pressure 125/81, pulse 83, temperature 98.5 F (36.9 C), temperature source Oral, resp. rate (!) 23, height 5\' 6"  (1.676 m), weight 102.1 kg, SpO2 97 %.  PHYSICAL EXAMINATION:  GENERAL:  32 y.o.-year-old patient lying in the bed with no acute distress.  EYES: Pupils equal, round, reactive to light and accommodation. No scleral icterus. Extraocular muscles intact.  HEENT: Head atraumatic, normocephalic. Oropharynx and nasopharynx clear.   NECK:  Supple, no jugular venous distention. No thyroid enlargement, no tenderness.  LUNGS: Normal breath sounds bilaterally, no wheezing, rales,rhonchi or crepitation. No use of accessory muscles of respiration.  CARDIOVASCULAR: S1, S2 normal. No murmurs, rubs, or gallops.  ABDOMEN: Soft, nontender, nondistended. Bowel sounds present. No organomegaly or mass.  EXTREMITIES: No pedal edema, cyanosis, or clubbing.  NEUROLOGIC: Cranial nerves II through XII are intact. Muscle strength 5/5 in all extremities. Sensation intact. Gait not checked.  PSYCHIATRIC: The patient is alert and oriented x 3.  SKIN: No obvious rash, lesion, or ulcer.   Physical Exam LABORATORY PANEL:   CBC Recent Labs  Lab 11/02/17 0242  WBC 22.4*  HGB 10.3*  HCT 28.8*  PLT 199   ------------------------------------------------------------------------------------------------------------------  Chemistries  Recent Labs  Lab 11/03/17 0435  NA 139  K 3.2*  CL 104  CO2 28  GLUCOSE 103*  BUN 39*  CREATININE 2.68*  CALCIUM 9.0  MG 2.1   ------------------------------------------------------------------------------------------------------------------  Cardiac Enzymes Recent Labs  Lab 11/01/17 0650 11/01/17 0930  TROPONINI 0.22* 0.17*   ------------------------------------------------------------------------------------------------------------------  RADIOLOGY:  US Renal Artery Duplex Complete  Result Date: 11/03/2017 CLINICAL DATA:  Hypertensive emergency EXAM: RENAL/URINARY TRACT ULTRASOUND RENAL DUPLEX DOPPLER ULTRASOUND COMPARISON:  10/09/2012, 11/01/2017 FINDINGS: Right Kidney: Length: 9.3 cm. Mild increased echogenicity. No mass or hydronephrosis. Left Kidney: Length: 9.8 cm. Echogenicity within normal limits. No mass or hydronephrosis visualized. Bladder:  Within normal limits. RENAL DUPLEX ULTRASOUND Right Renal Artery Velocities: Origin:  174 cm/sec Mid:  200 cm/sec Hilum:  133 cm/sec Interlobar:   31 cm/sec Arcuate:  19 cm/sec Left Renal Artery Velocities: Origin:  114 cm/sec Mid:  112 cm/sec Hilum:  140 cm/sec Interlobar:  24 cm/sec Arcuate:  20 cm/sec Aortic Velocity:  150 cm/sec Right Renal-Aortic Ratios: Origin: 1.16 Mid:  1.34 Hilum: 0.89 Interlobar: 0.21 Arcuate: 0.13 Left Renal-Aortic Ratios: Origin: 0.76 Mid: 0.75 Hilum: 0.93 Interlobar: 0.16 Arcuate: 0.14 IMPRESSION: No significant renal artery stenosis detected by renal duplex. Mild increased right kidney echogenicity suggesting medical renal disease. No hydronephrosis on either side. Small pleural effusions present bilaterally. Electronically Signed   By: Jerilynn Mages.  Shick M.D.   On: 11/03/2017 12:36    ASSESSMENT AND PLAN:  *Acute hypertensive crisis Try to wean Cardene drip off as tolerated,  Continue hydralazine, Norvasc, labetalol.  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  *Acute elevated troponins Most likely secondary to above/demand ischemia Echocardiogram noted for normal ejection fraction/normal wall motion Stress test report is pending.  *History of substance abuse Conservative medical management  Hypokalemia.  Potassium supplement and follow-up level.  Magnesium level is normal.  Leukocytosis.  Unclear etiology.  Urinalysis is normal.  Follow-up CBC.  Tobacco abuse.  Smoking cessation was counseled for 3 to 4 minutes.  Discussed with Dr. Jefferson Fuel. All the records are reviewed and case discussed with Care Management/Social Workerr. Management plans discussed with the patient, family and they are in agreement.  CODE STATUS: Full  TOTAL TIME TAKING CARE OF THIS PATIENT: 35 minutes.     POSSIBLE D/C IN 2-3 DAYS, DEPENDING ON CLINICAL  CONDITION.   Demetrios Loll M.D on 11/03/2017   Between 7am to 6pm - Pager - [phone]  After 6pm go to [email or web address] - password [email or web address] [facility]  Office  [phone]  CC: Primary care physician; [name], MD  Note: This dictation was prepared with Dragon dictation along with smaller phrase technology. Any transcriptional errors that result from this process are unintentional.

## 2017-11-03 NOTE — Progress Notes (Signed)
Progress Note  Patient Name: Jeremiah Keith Date of Encounter: 11/03/2017  Primary Cardiologist: New to [facility] - consult by Fletcher Anon  Subjective   No acute overnight events. BP slowly improving into the 831D systolic. Nitro gtt stopped. Remains on nicardipine gtt. Renal function improving to 2.68 this morning. Potassium remains low at 3.2. Magnesium at goal of 2.1.Renin/aldoserone ratio and catecholamines/metanephrines pending. He is for Myoview this morning.    Inpatient Medications    Scheduled Meds: . amLODipine  10 mg Oral Daily  . aspirin  81 mg Oral Daily  . hydrALAZINE  50 mg Oral Q8H  . labetalol  100 mg Oral BID  . potassium chloride  20 mEq Oral Q4H   Continuous Infusions: . niCARDipine 3 mg/hr (11/03/17 0535)  . nitroGLYCERIN Stopped (11/02/17 1426)   PRN Meds: acetaminophen **OR** acetaminophen, bisacodyl, ondansetron **OR** ondansetron (ZOFRAN) IV, senna-docusate   Vital Signs    Vitals:   11/03/17 0200 11/03/17 0300 11/03/17 0400 11/03/17 0533  BP: (!) 159/102  (!) 170/104 (!) 176/102  Pulse: 98 96 98   Resp: (!) 22 19 (!) 25   Temp: 98.6 F (37 C)     TempSrc: Oral     SpO2: 95% 97% 95%   Weight:      Height:        Intake/Output Summary (Last 24 hours) at 11/03/2017 0803 Last data filed at 11/03/2017 0730 Gross per 24 hour  Intake 360 ml  Output 6400 ml  Net -6040 ml   Filed Weights   11/01/17 0349  Weight: 102.1 kg    Telemetry    NSR to sinus tachycardia in the 90s to 110s bpm - Personally Reviewed  ECG    n/a - Personally Reviewed  Physical Exam   GEN: No acute distress.   Neck: No JVD. Cardiac: RRR, no murmurs, rubs, or gallops.  Respiratory: Clear to auscultation bilaterally.  GI: Soft, nontender, non-distended.   MS: No edema; No deformity. Neuro:  Alert and oriented x 3; Nonfocal.  Psych: Normal affect.  Labs    Chemistry Recent Labs  Lab 11/01/17 0350 11/02/17 0242 11/03/17 0435  NA 139 136 139  K 3.0* 3.4*  3.2*  CL 101 100 104  CO2 29 29 28   GLUCOSE 130* 127* 103*  BUN 36* 44* 39*  CREATININE 3.16* 3.05* 2.68*  CALCIUM 9.0 8.9 9.0  GFRNONAA 24* 25* 30*  GFRAA 28* 29* 34*  ANIONGAP 9 7 7      Hematology Recent Labs  Lab 11/01/17 0350 11/02/17 0242  WBC 14.8* 22.4*  RBC 4.06* 3.31*  HGB 12.6* 10.3*  HCT 35.2* 28.8*  MCV 86.8 87.1  MCH 31.2 31.2  MCHC 35.9 35.9  RDW 13.6 13.8  PLT 217 199    Cardiac Enzymes Recent Labs  Lab 11/01/17 0350 11/01/17 0650 11/01/17 0930  TROPONINI 0.19* 0.22* 0.17*   No results for input(s): TROPIPOC in the last 168 hours.   BNP Recent Labs  Lab 11/01/17 0350  BNP 1,384.0*     DDimer No results for input(s): DDIMER in the last 168 hours.   Radiology    US Renal  Result Date: 11/01/2017 IMPRESSION: 1. Normal renal ultrasound.  No hydronephrosis. 2. Left pleural effusion. Electronically Signed   By: Jeannine Boga M.D.   On: 11/01/2017 06:59   Dg Chest Portable 1 View  Result Date: 11/01/2017 IMPRESSION: Cardiac enlargement with pulmonary vascular congestion and mild interstitial edema. Possible small left pleural effusion. Electronically Signed  By: Lucienne Capers M.D.   On: 11/01/2017 04:29    Cardiac Studies   Echo 11/01/2017: Study Conclusions  - Left ventricle: The cavity size was normal. There was moderate   concentric hypertrophy. Systolic function was normal. The   estimated ejection fraction was in the range of 55% to 60%. Wall   motion was normal; there were no regional wall motion   abnormalities. - Aorta: Aortic root dimension: 42 mm (ED). - Aortic root: The aortic root was mildly dilated. - Left atrium: The atrium was mildly dilated. - Right atrium: The atrium was mildly dilated. - Pulmonary arteries: Systolic pressure could not be accurately   estimated. __________  Patient Profile     32 y.o. male with history of uncontrolled HTN who was admitted to [facility] with hypertensive urgency with end organ  damage and demand ischemia.   Assessment & Plan    1. Hypertensive emergency with end organ damage: -BP is improving, though remains elevated into the 081K systolic at this time -Weaned off nitro gtt -Remains on nicardipine gtt, wean as able -Increase hydralazine to 75 mg q 8 hours -Continue Norvasc 10 mg daily an labetalol 100 mg bid -Not on ACEi/ARB at this time given ARF -Secondary hypertension workup pending  2. Elevated troponin: -Mildly elevated and flat trending with a peak of 0.22 -Likely supply demand ischemia in the setting of his hypertensive urgency and ARF -Echo demonstrated a preserved LVSF with an EF of 55-60% and normal wall motion -He is for nuclear stress test this morning (Lexiscan given BP) -NPO -No plans for LHC at this time, unless Myoview is abnormal given improvement in symptoms with BP improvement and ARF -Heparin gtt has been stopped   3. ARF: -Improving -Secondary to #1 -Nephrology on board -Avoid nephro-toxic agents -Not on ACEi/ARB as above, start when/if able down the road as his SCr trend improves  4. Hypokalemia: -Being repleted by pharmacy    For questions or updates, please contact [place] Please consult [email or web address] for contact info under Cardiology/STEMI.    Signed, Christell Faith, PA-C [place] Pager: [phone] 11/03/2017, 8:03 AM

## 2017-11-03 NOTE — Care Management (Signed)
RNCM attempted to meet with patient regarding PCP and medication assistance. He is listed as self-pay- no health/drug insurance.  His wife was present and speaking with financial aide however patient was not present at the time.  RNCM will follow up with patient.  [facility] and Medication management information left with wife.

## 2017-11-04 LAB — BASIC METABOLIC PANEL
Anion gap: 10 (ref 5–15)
BUN: 45 mg/dL — ABNORMAL HIGH (ref 6–20)
CO2: 24 mmol/L (ref 22–32)
Calcium: 9.1 mg/dL (ref 8.9–10.3)
Chloride: 104 mmol/L (ref 98–111)
Creatinine, Ser: 2.69 mg/dL — ABNORMAL HIGH (ref 0.61–1.24)
GFR calc Af Amer: 34 mL/min — ABNORMAL LOW (ref >60)
GFR, EST NON AFRICAN AMERICAN: 29 mL/min — AB (ref >60)
Glucose, Bld: 98 mg/dL (ref 70–99)
POTASSIUM: 3.6 mmol/L (ref 3.5–5.1)
SODIUM: 138 mmol/L (ref 135–145)

## 2017-11-04 MED ORDER — HYDRALAZINE HCL 50 MG PO TABS
100.0000 mg | ORAL_TABLET | Freq: Three times a day (TID) | ORAL | Status: DC
  Administered 2017-11-04 – 2017-11-05 (×4): 100 mg via ORAL
  Filled 2017-11-04 (×6): qty 2

## 2017-11-04 MED ORDER — HYDRALAZINE HCL 20 MG/ML IJ SOLN
10.0000 mg | INTRAMUSCULAR | Status: DC | PRN
  Administered 2017-11-04: 10 mg via INTRAVENOUS
  Filled 2017-11-04: qty 1

## 2017-11-04 MED ORDER — HYDROCODONE-ACETAMINOPHEN 5-325 MG PO TABS
1.0000 | ORAL_TABLET | ORAL | Status: DC | PRN
  Administered 2017-11-04: 1 via ORAL
  Filled 2017-11-04: qty 1

## 2017-11-04 MED ORDER — CARVEDILOL 25 MG PO TABS
25.0000 mg | ORAL_TABLET | Freq: Two times a day (BID) | ORAL | Status: DC
  Administered 2017-11-04 – 2017-11-05 (×3): 25 mg via ORAL
  Filled 2017-11-04: qty 1
  Filled 2017-11-04: qty 2
  Filled 2017-11-04: qty 1

## 2017-11-04 MED ORDER — CLONIDINE HCL 0.1 MG PO TABS
0.1000 mg | ORAL_TABLET | Freq: Two times a day (BID) | ORAL | Status: DC
  Administered 2017-11-04 (×2): 0.1 mg via ORAL
  Filled 2017-11-04 (×3): qty 1

## 2017-11-04 NOTE — Progress Notes (Signed)
Earlier in the shift the patient was overheard having numerous arguments with his wife which resulted in her walking out. Patient demanded to have his IV removed from his arm that was saline locked. Explained to patient that he needs to have at least one IV and he said that that particular IV was "hurting him". That IV discontinued. Patient did let IV team start a new IV on him. Patient's SBP has remained elevated despite medications. New order received from NP to give a new order PRN Hydralazine along with his 0600 medication. Patient tolerated IV hydralazine. When this RN went to give him the PO hydralazine and check his blood pressure he began yelling explicitly that the cuff was too tight and he was ripping it off. Patient preceded to rip off the cuff and refused PO Hydralazine. Patient currently in the room talking loudly on the telephone.

## 2017-11-04 NOTE — Progress Notes (Signed)
[facility]  ROUNDING NOTE   Subjective:  Patient a bit disgruntled this a.m. Wants to leave. Blood pressure still quite high at 170/100. We have added clonidine 0.1 mg p.o. twice daily to his medication regimen.  Objective:  Vital signs in last 24 hours:  Temp:  [98.5 F (36.9 C)] 98.5 F (36.9 C) (08/22 0200) Pulse Rate:  [78-94] 85 (08/22 0500) Resp:  [14-23] 14 (08/22 0500) BP: (125-177)/(77-112) 170/100 (08/22 0500) SpO2:  [93 %-99 %] 99 % (08/22 0500)  Weight change:  Filed Weights   11/01/17 0349  Weight: 102.1 kg    Intake/Output: I/O last 3 completed shifts: In: 1481.6 [P.O.:480; I.V.:1001.6] Out: 6075 [Urine:6075]   Intake/Output this shift:  No intake/output data recorded.  Physical Exam: General: No acute distress  Head: Normocephalic, atraumatic. Moist oral mucosal membranes  Eyes: Anicteric  Neck: Supple, trachea midline  Lungs:  Clear to auscultation, normal effort  Heart: S1S2 no rubs  Abdomen:  Soft, nontender, bowel sounds present  Extremities: No peripheral edema.  Neurologic: Awake, alert, following commands  Skin: No lesions       Basic Metabolic Panel: Recent Labs  Lab 11/01/17 0350 11/01/17 0650 11/02/17 0242 11/03/17 0435 11/04/17 0513  NA 139  --  136 139 138  K 3.0*  --  3.4* 3.2* 3.6  CL 101  --  100 104 104  CO2 29  --  29 28 24   GLUCOSE 130*  --  127* 103* 98  BUN 36*  --  44* 39* 45*  CREATININE 3.16*  --  3.05* 2.68* 2.69*  CALCIUM 9.0  --  8.9 9.0 9.1  MG  --  2.2  --  2.1  --   PHOS  --  1.5* 5.4* 4.6  --     Liver Function Tests: No results for input(s): AST, ALT, ALKPHOS, BILITOT, PROT, ALBUMIN in the last 168 hours. No results for input(s): LIPASE, AMYLASE in the last 168 hours. No results for input(s): AMMONIA in the last 168 hours.  CBC: Recent Labs  Lab 11/01/17 0350 11/02/17 0242  WBC 14.8* 22.4*  HGB 12.6* 10.3*  HCT 35.2* 28.8*  MCV 86.8 87.1  PLT 217 199    Cardiac  Enzymes: Recent Labs  Lab 11/01/17 0350 11/01/17 0650 11/01/17 0930  TROPONINI 0.19* 0.22* 0.17*    BNP: Invalid input(s): POCBNP  CBG: Recent Labs  Lab 11/01/17 0756  GLUCAP 192*    Microbiology: No results found for this or any previous visit.  Coagulation Studies: No results for input(s): LABPROT, INR in the last 72 hours.  Urinalysis: No results for input(s): COLORURINE, LABSPEC, PHURINE, GLUCOSEU, HGBUR, BILIRUBINUR, KETONESUR, PROTEINUR, UROBILINOGEN, NITRITE, LEUKOCYTESUR in the last 72 hours.  Invalid input(s): APPERANCEUR    Imaging: Nm Myocar Multi W/spect W/wall Motion / Ef  Result Date: 11/03/2017  Abnormal myocardial perfusion stress test due to moderately reduced left ventricular systolic function (LVEF 14-97%).  The left ventricle is dilated with diffuse hypokinesis.  There is no significant ischemia or scar.  This is an intermediate risk study.    US Renal Artery Duplex Complete  Result Date: 11/03/2017 CLINICAL DATA:  Hypertensive emergency EXAM: RENAL/URINARY TRACT ULTRASOUND RENAL DUPLEX DOPPLER ULTRASOUND COMPARISON:  10/09/2012, 11/01/2017 FINDINGS: Right Kidney: Length: 9.3 cm. Mild increased echogenicity. No mass or hydronephrosis. Left Kidney: Length: 9.8 cm. Echogenicity within normal limits. No mass or hydronephrosis visualized. Bladder:  Within normal limits. RENAL DUPLEX ULTRASOUND Right Renal Artery Velocities: Origin:  174 cm/sec Mid:  200 cm/sec Hilum:  133 cm/sec Interlobar:  31 cm/sec Arcuate:  19 cm/sec Left Renal Artery Velocities: Origin:  114 cm/sec Mid:  112 cm/sec Hilum:  140 cm/sec Interlobar:  24 cm/sec Arcuate:  20 cm/sec Aortic Velocity:  150 cm/sec Right Renal-Aortic Ratios: Origin: 1.16 Mid:  1.34 Hilum: 0.89 Interlobar: 0.21 Arcuate: 0.13 Left Renal-Aortic Ratios: Origin: 0.76 Mid: 0.75 Hilum: 0.93 Interlobar: 0.16 Arcuate: 0.14 IMPRESSION: No significant renal artery stenosis detected by renal duplex. Mild increased right kidney  echogenicity suggesting medical renal disease. No hydronephrosis on either side. Small pleural effusions present bilaterally. Electronically Signed   By: Jerilynn Mages.  Shick M.D.   On: 11/03/2017 12:36     Medications:    . amLODipine  10 mg Oral Daily  . aspirin  81 mg Oral Daily  . carvedilol  12.5 mg Oral BID WC  . cloNIDine  0.1 mg Oral BID  . hydrALAZINE  100 mg Oral Q8H   acetaminophen **OR** acetaminophen, bisacodyl, hydrALAZINE, ondansetron **OR** ondansetron (ZOFRAN) IV, senna-docusate  Assessment/ Plan:  32 y.o. male with a PMHx of hypertension, history of substance abuse, depression, who was admitted to [facility] on 11/01/2017 for evaluation of significant shortness of breath.    1.  Acute renal failure. 2.  Proteinuria. 3.  Hypertension, severe, not on outpatient medications.  4.  Chest pain with shortness of breath.   Plan:  Blood pressure still high at 170/100.  We have added clonidine 0.1 mg p.o. twice daily to his medication regimen.  Carvedilol to be increased as well per cardiology.  Otherwise continue amlodipine and hydralazine at the current doses.  Renal function has improved a bit.  Consider adding an ARB once renal parameters have stabilized.   LOS: 3 Junella Domke 8/22/20198:35 AM

## 2017-11-04 NOTE — Progress Notes (Signed)
Progress Note  Patient Name: Jeremiah Keith Date of Encounter: 11/04/2017  Primary Cardiologist: New to [facility] - consult by Fletcher Anon  Subjective   The patient was activated today and he has been pertinent to move all normal.  He refused blood pressure monitoring and heart rate monitor. After I explained to him that seriousness of his medical conditions, he agreed to be treated.   Inpatient Medications    Scheduled Meds: . amLODipine  10 mg Oral Daily  . aspirin  81 mg Oral Daily  . carvedilol  25 mg Oral BID WC  . cloNIDine  0.1 mg Oral BID  . hydrALAZINE  100 mg Oral Q8H   Continuous Infusions:  PRN Meds: acetaminophen **OR** acetaminophen, bisacodyl, hydrALAZINE, ondansetron **OR** ondansetron (ZOFRAN) IV, senna-docusate   Vital Signs    Vitals:   11/04/17 0300 11/04/17 0400 11/04/17 0449 11/04/17 0500  BP: (!) 160/93 (!) 166/102 (!) 166/102 (!) 170/100  Pulse: 89   85  Resp:  19  14  Temp:      TempSrc:      SpO2: 97%   99%  Weight:      Height:        Intake/Output Summary (Last 24 hours) at 11/04/2017 0852 Last data filed at 11/04/2017 0200 Gross per 24 hour  Intake 1241.64 ml  Output 2225 ml  Net -983.36 ml   Filed Weights   11/01/17 0349  Weight: 102.1 kg    Telemetry    NSR to sinus tachycardia in the 90s to 110s bpm - Personally Reviewed  ECG    n/a - Personally Reviewed  Physical Exam   GEN: No acute distress.   Neck: No JVD. Cardiac: RRR, no murmurs, rubs, or gallops.  Respiratory: Clear to auscultation bilaterally.  GI: Soft, nontender, non-distended.   MS: No edema; No deformity. Neuro:  Alert and oriented x 3; Nonfocal.  Psych: Normal affect.  Labs    Chemistry Recent Labs  Lab 11/02/17 0242 11/03/17 0435 11/04/17 0513  NA 136 139 138  K 3.4* 3.2* 3.6  CL 100 104 104  CO2 29 28 24   GLUCOSE 127* 103* 98  BUN 44* 39* 45*  CREATININE 3.05* 2.68* 2.69*  CALCIUM 8.9 9.0 9.1  GFRNONAA 25* 30* 29*  GFRAA 29* 34* 34*    ANIONGAP 7 7 10      Hematology Recent Labs  Lab 11/01/17 0350 11/02/17 0242  WBC 14.8* 22.4*  RBC 4.06* 3.31*  HGB 12.6* 10.3*  HCT 35.2* 28.8*  MCV 86.8 87.1  MCH 31.2 31.2  MCHC 35.9 35.9  RDW 13.6 13.8  PLT 217 199    Cardiac Enzymes Recent Labs  Lab 11/01/17 0350 11/01/17 0650 11/01/17 0930  TROPONINI 0.19* 0.22* 0.17*   No results for input(s): TROPIPOC in the last 168 hours.   BNP Recent Labs  Lab 11/01/17 0350  BNP 1,384.0*     DDimer No results for input(s): DDIMER in the last 168 hours.   Radiology    US Renal  Result Date: 11/01/2017 IMPRESSION: 1. Normal renal ultrasound.  No hydronephrosis. 2. Left pleural effusion. Electronically Signed   By: Jeannine Boga M.D.   On: 11/01/2017 06:59   Dg Chest Portable 1 View  Result Date: 11/01/2017 IMPRESSION: Cardiac enlargement with pulmonary vascular congestion and mild interstitial edema. Possible small left pleural effusion. Electronically Signed   By: Lucienne Capers M.D.   On: 11/01/2017 04:29    Cardiac Studies   Echo 11/01/2017: Study  Conclusions  - Left ventricle: The cavity size was normal. There was moderate   concentric hypertrophy. Systolic function was normal. The   estimated ejection fraction was in the range of 55% to 60%. Wall   motion was normal; there were no regional wall motion   abnormalities. - Aorta: Aortic root dimension: 42 mm (ED). - Aortic root: The aortic root was mildly dilated. - Left atrium: The atrium was mildly dilated. - Right atrium: The atrium was mildly dilated. - Pulmonary arteries: Systolic pressure could not be accurately   estimated. __________  Patient Profile     32 y.o. male with history of uncontrolled HTN who was admitted to [facility] with hypertensive urgency with end organ damage and demand ischemia.   Assessment & Plan    1. Hypertensive emergency with end organ damage:  Blood pressure remains elevated and not controlled. Clonidine was  added by nephrology. I increased carvedilol to 25 mg twice daily. Renal artery duplex with no evidence of renal artery stenosis.  The rest of work-up for secondary hypertension is still pending.  2. Elevated troponin: -Mildly elevated and flat trending with a peak of 0.22 -Likely supply demand ischemia in the setting of his hypertensive urgency and ARF -Nuclear stress test showed no evidence of ischemia although EF was mildly reduced which is likely due to hypertensive heart disease.  Recommend medical therapy.  Continue treatment with carvedilol.  No ACE inhibitor or ARB due to acute renal failure.  Continue hydralazine and can consider adding Imdur.  3. ARF: -Improving -Secondary to #1 -Nephrology on board -Avoid nephro-toxic agents -Not on ACEi/ARB as above, start when/if able down the road as his SCr trend improves  I discussed the case with Dr. Zollie Scale and nursing.  For questions or updates, please contact [place] Please consult [email or web address] for contact info under Cardiology/STEMI.    Signed, Kathlyn Sacramento, MD [facility] 11/04/2017, 8:52 AM

## 2017-11-04 NOTE — Progress Notes (Signed)
Follow up - Critical Care Medicine Note  Patient Details:    Jeremiah Keith is an 32 y.o. male.with a past medical history remarkable for substance abuse, depression, with a known history of hypertension but has not been interested in therapy, presented to the emergency department with episode of shortness of breath. He's also been having some intermittent chest pain since Thursday also with blurry vision. In emergency department his initial blood pressure was 244/144. He was started on a Cardene infusion along with nitroglycerin was subsequently transferred to the intensive care unit.  Lines, Airways, Drains:    Anti-infectives:  Anti-infectives (From admission, onward)   None      Microbiology: No results found for this or any previous visit. Studies: US Renal  Result Date: 11/01/2017 CLINICAL DATA:  Initial evaluation for acute renal injury. EXAM: RENAL / URINARY TRACT ULTRASOUND COMPLETE COMPARISON:  Prior CT from 10/09/2012. FINDINGS: Right Kidney: Length: 10.2 cm. Echogenicity within normal limits. No mass or hydronephrosis visualized. Left Kidney: Length: 9.3 cm. Echogenicity within normal limits. No mass or hydronephrosis visualized. Bladder: Appears normal for degree of bladder distention. Incidental note made of a left pleural effusion. IMPRESSION: 1. Normal renal ultrasound.  No hydronephrosis. 2. Left pleural effusion. Electronically Signed   By: Jeannine Boga M.D.   On: 11/01/2017 06:59   Nm Myocar Multi W/spect W/wall Motion / Ef  Result Date: 11/03/2017  Abnormal myocardial perfusion stress test due to moderately reduced left ventricular systolic function (LVEF 16-01%).  The left ventricle is dilated with diffuse hypokinesis.  There is no significant ischemia or scar.  This is an intermediate risk study.    Dg Chest Portable 1 View  Result Date: 11/01/2017 CLINICAL DATA:  Shortness of breath for 3 days, worse tonight. Tightness in the chest. Clammy. EXAM:  PORTABLE CHEST 1 VIEW COMPARISON:  12/08/2015 FINDINGS: Cardiac enlargement with pulmonary vascular congestion. Basilar interstitial changes likely representing early interstitial edema. Possible small left pleural effusion. No consolidation. No pneumothorax. Mediastinal contours appear intact. IMPRESSION: Cardiac enlargement with pulmonary vascular congestion and mild interstitial edema. Possible small left pleural effusion. Electronically Signed   By: Lucienne Capers M.D.   On: 11/01/2017 04:29   US Renal Artery Duplex Complete  Result Date: 11/03/2017 CLINICAL DATA:  Hypertensive emergency EXAM: RENAL/URINARY TRACT ULTRASOUND RENAL DUPLEX DOPPLER ULTRASOUND COMPARISON:  10/09/2012, 11/01/2017 FINDINGS: Right Kidney: Length: 9.3 cm. Mild increased echogenicity. No mass or hydronephrosis. Left Kidney: Length: 9.8 cm. Echogenicity within normal limits. No mass or hydronephrosis visualized. Bladder:  Within normal limits. RENAL DUPLEX ULTRASOUND Right Renal Artery Velocities: Origin:  174 cm/sec Mid:  200 cm/sec Hilum:  133 cm/sec Interlobar:  31 cm/sec Arcuate:  19 cm/sec Left Renal Artery Velocities: Origin:  114 cm/sec Mid:  112 cm/sec Hilum:  140 cm/sec Interlobar:  24 cm/sec Arcuate:  20 cm/sec Aortic Velocity:  150 cm/sec Right Renal-Aortic Ratios: Origin: 1.16 Mid:  1.34 Hilum: 0.89 Interlobar: 0.21 Arcuate: 0.13 Left Renal-Aortic Ratios: Origin: 0.76 Mid: 0.75 Hilum: 0.93 Interlobar: 0.16 Arcuate: 0.14 IMPRESSION: No significant renal artery stenosis detected by renal duplex. Mild increased right kidney echogenicity suggesting medical renal disease. No hydronephrosis on either side. Small pleural effusions present bilaterally. Electronically Signed   By: Jerilynn Mages.  Shick M.D.   On: 11/03/2017 12:36    Consults: Treatment Team:  Arta Silence, MD Anthonette Legato, MD Wellington Hampshire, MD   Subjective:    Overnight Issues: patient is upset this morning. Requesting to leave.Spoke to both  nephrology and cardiology.  Blood pressure is still on the high side. Has been weaned off of Cardene infusion area. Clonidine and carvedilol have been increased in addition.  Objective:  Vital signs for last 24 hours: Temp:  [98.5 F (36.9 C)] 98.5 F (36.9 C) (08/22 0200) Pulse Rate:  [78-94] 85 (08/22 0500) Resp:  [14-23] 14 (08/22 0500) BP: (125-177)/(77-112) 170/100 (08/22 0500) SpO2:  [93 %-99 %] 99 % (08/22 0500)  Hemodynamic parameters for last 24 hours:    Intake/Output from previous day: 08/21 0701 - 08/22 0700 In: 1241.6 [P.O.:240; I.V.:1001.6] Out: 3325 [[id number]]  Intake/Output this shift: No intake/output data recorded.  Vent settings for last 24 hours:    Physical Exam:  Vital signs:       Please see the above listed vital signs. Patient is still hypertensive on Cardene gtt HEENT:           Atraumatic, normocephalic, neck supple, No JVD Cardiovascular:          RRR,s1s2, s4 noted,  Pulmonary:      Clear bilaterally, even, nonlababored, normal effort  Abdominal:      Soft, nontender, nondistended, BS+ x4 Extremities:     Normal bulk and tone, no deformities, no edema Neurologic:      A&O x4, No focal deficits Cutaneous:      Warm/dry.  No obvious rashes, lesions, or ulcerations   Assessment/Plan:   Hypertensive Emergency with evidence of end organ damage, including renal disease, Elevated troponin, abnormal EKG, and visual disturbances. Present regimen includes amlodipine at 10 mg, Coreg 25 mg twice a day, clonidine 0.1 mg twice daily, hydralazine 100 mg every 8.  Renal failure. BUN is 45 and creatinine 2.69 which is essentially stable from yesterday. Peak creatinine on admission at 3.05. Ultrasound of the renal artery was negative for stenosis  Elevated Troponin. Status post nuclear medicine stress test which revealed reduced ejection fraction, dilated ventricle however no clear ischemia appreciated on study.  At this point patient is doing well may be  transferred to the floor, with pending discharge tomorrow if his blood pressure remains under control  11/04/2017 Patient ID: Jeremiah Keith, male   DOB: [birth year], 32 y.o.   MRN: [id number]

## 2017-11-04 NOTE — Progress Notes (Signed)
[place] [facility] [place] NAME: Jeremiah Keith    MR#:  [id number]  DATE OF BIRTH:  [birth year]  SUBJECTIVE:  CHIEF COMPLAINT:   Chief Complaint  Patient presents with  . Respiratory Distress  Patient has headache, off Cardene drip.  REVIEW OF SYSTEMS:  CONSTITUTIONAL: No fever, fatigue or weakness.  But has headache. EYES: No blurred or double vision.  EARS, NOSE, AND THROAT: No tinnitus or ear pain.  RESPIRATORY: No cough, shortness of breath, wheezing or hemoptysis.  CARDIOVASCULAR: No chest pain, orthopnea, edema.  GASTROINTESTINAL: No nausea, vomiting, diarrhea or abdominal pain.  GENITOURINARY: No dysuria, hematuria.  ENDOCRINE: No polyuria, nocturia,  HEMATOLOGY: No anemia, easy bruising or bleeding SKIN: No rash or lesion. MUSCULOSKELETAL: No joint pain or arthritis.   NEUROLOGIC: No tingling, numbness, weakness.  PSYCHIATRY: No anxiety or depression.   ROS  DRUG ALLERGIES:   Allergies  Allergen Reactions  . Penicillins Rash    Has patient had a PCN reaction causing immediate rash, facial/tongue/throat swelling, SOB or lightheadedness with hypotension: Yes Has patient had a PCN reaction causing severe rash involving mucus membranes or skin necrosis: No Has patient had a PCN reaction that required hospitalization: No Has patient had a PCN reaction occurring within the last 10 years: No If all of the above answers are "NO", then may proceed with Cephalosporin use.     VITALS:  Blood pressure (!) 149/97, pulse 86, temperature 98.6 F (37 C), temperature source Axillary, resp. rate 14, height 5\' 6"  (1.676 m), weight 102.1 kg, SpO2 99 %.  PHYSICAL EXAMINATION:  GENERAL:  32 y.o.-year-old patient lying in the bed with no acute distress.  EYES: Pupils equal, round, reactive to light and accommodation. No scleral icterus. Extraocular muscles intact.  HEENT: Head atraumatic, normocephalic. Oropharynx and nasopharynx clear.   NECK:  Supple, no jugular venous distention. No thyroid enlargement, no tenderness.  LUNGS: Normal breath sounds bilaterally, no wheezing, rales,rhonchi or crepitation. No use of accessory muscles of respiration.  CARDIOVASCULAR: S1, S2 normal. No murmurs, rubs, or gallops.  ABDOMEN: Soft, nontender, nondistended. Bowel sounds present. No organomegaly or mass.  EXTREMITIES: No pedal edema, cyanosis, or clubbing.  NEUROLOGIC: Cranial nerves II through XII are intact. Muscle strength 5/5 in all extremities. Sensation intact. Gait not checked.  PSYCHIATRIC: The patient is alert and oriented x 3.  SKIN: No obvious rash, lesion, or ulcer.   Physical Exam LABORATORY PANEL:   CBC Recent Labs  Lab 11/02/17 0242  WBC 22.4*  HGB 10.3*  HCT 28.8*  PLT 199   ------------------------------------------------------------------------------------------------------------------  Chemistries  Recent Labs  Lab 11/03/17 0435 11/04/17 0513  NA 139 138  K 3.2* 3.6  CL 104 104  CO2 28 24  GLUCOSE 103* 98  BUN 39* 45*  CREATININE 2.68* 2.69*  CALCIUM 9.0 9.1  MG 2.1  --    ------------------------------------------------------------------------------------------------------------------  Cardiac Enzymes Recent Labs  Lab 11/01/17 0650 11/01/17 0930  TROPONINI 0.22* 0.17*   ------------------------------------------------------------------------------------------------------------------  RADIOLOGY:  Nm Myocar Multi W/spect W/wall Motion / Ef  Result Date: 11/03/2017  Abnormal myocardial perfusion stress test due to moderately reduced left ventricular systolic function (LVEF 40-98%).  The left ventricle is dilated with diffuse hypokinesis.  There is no significant ischemia or scar.  This is an intermediate risk study.    US Renal Artery Duplex Complete  Result Date: 11/03/2017 CLINICAL DATA:  Hypertensive emergency EXAM: RENAL/URINARY TRACT ULTRASOUND RENAL DUPLEX DOPPLER ULTRASOUND  COMPARISON:  10/09/2012, 11/01/2017 FINDINGS:  Right Kidney: Length: 9.3 cm. Mild increased echogenicity. No mass or hydronephrosis. Left Kidney: Length: 9.8 cm. Echogenicity within normal limits. No mass or hydronephrosis visualized. Bladder:  Within normal limits. RENAL DUPLEX ULTRASOUND Right Renal Artery Velocities: Origin:  174 cm/sec Mid:  200 cm/sec Hilum:  133 cm/sec Interlobar:  31 cm/sec Arcuate:  19 cm/sec Left Renal Artery Velocities: Origin:  114 cm/sec Mid:  112 cm/sec Hilum:  140 cm/sec Interlobar:  24 cm/sec Arcuate:  20 cm/sec Aortic Velocity:  150 cm/sec Right Renal-Aortic Ratios: Origin: 1.16 Mid:  1.34 Hilum: 0.89 Interlobar: 0.21 Arcuate: 0.13 Left Renal-Aortic Ratios: Origin: 0.76 Mid: 0.75 Hilum: 0.93 Interlobar: 0.16 Arcuate: 0.14 IMPRESSION: No significant renal artery stenosis detected by renal duplex. Mild increased right kidney echogenicity suggesting medical renal disease. No hydronephrosis on either side. Small pleural effusions present bilaterally. Electronically Signed   By: Jerilynn Mages.  Shick M.D.   On: 11/03/2017 12:36    ASSESSMENT AND PLAN:  *Acute hypertensive crisis Weaned Cardene drip.  Blood pressure is better controlled. Continue hydralazine, Norvasc, coreg (increased to 25 mg po bid). Added clonidine.  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  *Acute elevated troponins Most likely secondary to above/demand ischemia Echocardiogram noted for normal ejection fraction/normal wall motion Stress test report is remarkable..  *History of substance abuse Conservative medical management  Hypokalemia.  Improved with potassium supplement.  Magnesium level is normal.  Leukocytosis.  Unclear etiology.   Urinalysis is normal.  Follow-up CBC.  Tobacco abuse.  Smoking cessation was counseled for 3 to 4 minutes.  Discussed with Dr. Jefferson Fuel. All the records are reviewed and case discussed with Care Management/Social Workerr. Management plans discussed with the patient, family and they are in agreement.  CODE STATUS: Full  TOTAL TIME TAKING CARE OF THIS PATIENT: 35 minutes.     POSSIBLE D/C IN 2-3 DAYS, DEPENDING ON CLINICAL CONDITION.   Demetrios Loll M.D on 11/04/2017   Between 7am to 6pm - Pager - [phone]  After 6pm go to [email or web address] - password [email or web address] [facility]  Office  [phone]  CC: Primary care physician; [name], MD  Note: This dictation was prepared with Dragon dictation along with smaller phrase technology. Any transcriptional errors that result from this process are unintentional.

## 2017-11-04 NOTE — Progress Notes (Signed)
Pt is alert and combative, refusing to be connected to a monitor. Pt States does not "Do not take my blood pressure, do not hook me up to the monitor". Order for [place] held, explained to the pt the importance of assessment to administer medications and treatment. Pt continues to refuse assessment.  Pt states " I want to go home". He is wearing his street clothes, requested to speak with a doctor. Pt also states " Are you pregnant? I am radio active and you need to leave the room so that your baby wont be harmed."  Dr. Jefferson Fuel notified.

## 2017-11-04 NOTE — Progress Notes (Addendum)
Pt agreed to be placed on monitor after speaking with  DR. Arida. Pt A&O, and agitated. Pt continues to use profanity upon RN entering the room, yelling that he now wants his medications and that he needs to get to work. Mother at the bedside explains to pt that he needs to continue is his treatment. Pt complains of headache 10 on a 0-10 pain scale. PRN Norco given. Pt medications administered as ordered. BP 178/111. Will continue to monitor and recheck BP.  Pt's mother remains at bedside.

## 2017-11-04 NOTE — Consult Note (Addendum)
PHARMACY CONSULT NOTE - INITIAL   Pharmacy Consult for Electrolyte Monitoring and Replacement   Labs: Potassium (mmol/L)  Date Value  11/04/2017 3.6  04/12/2014 3.9   Magnesium (mg/dL)  Date Value  11/03/2017 2.1   Calcium (mg/dL)  Date Value  11/04/2017 9.1   Calcium, Total (mg/dL)  Date Value  04/12/2014 9.0   Phosphorus (mg/dL)  Date Value  11/03/2017 4.6   Albumin (g/dL)  Date Value  04/12/2014 4.3  ] Estimated Creatinine Clearance: 43.7 mL/min (A) (by C-G formula based on SCr of 2.69 mg/dL (H)).  Assessment: Pharmacy consulted for electrolyte monitoring and replacement in 32yo male admitted with Hypertensive urgency/emergency, elevated troponin,  pulmonary edema, and AKI. Patient had hypokalemia and hypophosphatemia on admission.   Goal of Therapy:  Electrolytes WNL  Plan:  No supplementation needed at this time.   Recheck electrolytes in 48 hours w/AM labs   Pharmacy will continue to replace as needed.   Pernell Dupre, PharmD, BCPS Clinical Pharmacist 11/04/2017 9:55 AM

## 2017-11-04 NOTE — Progress Notes (Signed)
Patient came out to the nurse's station in his street clothes stating he was leaving right now. Patient said he wanted his paper scripts for his medications and is leaving right now. Attempted to calm down patient, unsuccessfully. Explained to patient that there was not a provider available that could give him scripts at this time and that if he left AMA he would be leaving without any scripts. Attempted to have patient speak to E-Link MD but patient refused. Patient stated he was going to sit on the chair in the room until the doctor got here at [place].

## 2017-11-05 ENCOUNTER — Other Ambulatory Visit: Payer: Self-pay

## 2017-11-05 LAB — CBC
HCT: 33.7 % — ABNORMAL LOW (ref 40.0–52.0)
Hemoglobin: 11.9 g/dL — ABNORMAL LOW (ref 13.0–18.0)
MCH: 31.3 pg (ref 26.0–34.0)
MCHC: 35.3 g/dL (ref 32.0–36.0)
MCV: 88.6 fL (ref 80.0–100.0)
PLATELETS: 244 10*3/uL (ref 150–440)
RBC: 3.8 MIL/uL — AB (ref 4.40–5.90)
RDW: 13.8 % (ref 11.5–14.5)
WBC: 12.3 10*3/uL — AB (ref 3.8–10.6)

## 2017-11-05 LAB — BASIC METABOLIC PANEL
Anion gap: 9 (ref 5–15)
BUN: 49 mg/dL — AB (ref 6–20)
CALCIUM: 8.9 mg/dL (ref 8.9–10.3)
CO2: 24 mmol/L (ref 22–32)
CREATININE: 2.82 mg/dL — AB (ref 0.61–1.24)
Chloride: 103 mmol/L (ref 98–111)
GFR, EST AFRICAN AMERICAN: 32 mL/min — AB (ref >60)
GFR, EST NON AFRICAN AMERICAN: 28 mL/min — AB (ref >60)
Glucose, Bld: 99 mg/dL (ref 70–99)
Potassium: 3.6 mmol/L (ref 3.5–5.1)
Sodium: 136 mmol/L (ref 135–145)

## 2017-11-05 MED ORDER — CLONIDINE HCL 0.1 MG PO TABS
0.2000 mg | ORAL_TABLET | Freq: Two times a day (BID) | ORAL | Status: DC
  Administered 2017-11-06: 06:00:00 0.2 mg via ORAL
  Filled 2017-11-05: qty 2

## 2017-11-05 MED ORDER — AMLODIPINE BESYLATE 10 MG PO TABS: 10.0000 mg | ORAL_TABLET | Freq: Every day | ORAL | 1 refills | Status: DC

## 2017-11-05 MED ORDER — ISOSORBIDE MONONITRATE ER 30 MG PO TB24: 30.0000 mg | ORAL_TABLET | Freq: Every day | ORAL | 1 refills | Status: DC

## 2017-11-05 MED ORDER — LOSARTAN POTASSIUM 50 MG PO TABS
50.0000 mg | ORAL_TABLET | Freq: Every day | ORAL | Status: DC
  Administered 2017-11-05: 09:00:00 50 mg via ORAL
  Filled 2017-11-05: qty 1

## 2017-11-05 MED ORDER — CARVEDILOL 25 MG PO TABS
25.0000 mg | ORAL_TABLET | Freq: Two times a day (BID) | ORAL | Status: DC
  Administered 2017-11-06: 25 mg via ORAL
  Filled 2017-11-05 (×2): qty 1

## 2017-11-05 MED ORDER — CLONIDINE HCL 0.2 MG PO TABS: 0.2000 mg | ORAL_TABLET | Freq: Two times a day (BID) | ORAL | 1 refills | Status: DC

## 2017-11-05 MED ORDER — CARVEDILOL 25 MG PO TABS: 25.0000 mg | ORAL_TABLET | Freq: Two times a day (BID) | ORAL | 1 refills | Status: DC

## 2017-11-05 MED ORDER — SODIUM CHLORIDE 0.9 % IV SOLN
INTRAVENOUS | Status: DC
  Administered 2017-11-05: 14:00:00 via INTRAVENOUS

## 2017-11-05 MED ORDER — CLONIDINE HCL 0.1 MG PO TABS
0.2000 mg | ORAL_TABLET | Freq: Two times a day (BID) | ORAL | Status: DC
  Administered 2017-11-05: 0.2 mg via ORAL
  Filled 2017-11-05: qty 2

## 2017-11-05 MED ORDER — HYDRALAZINE HCL 100 MG PO TABS: 100.0000 mg | ORAL_TABLET | Freq: Three times a day (TID) | ORAL | 1 refills | Status: DC

## 2017-11-05 MED ORDER — ISOSORBIDE MONONITRATE ER 30 MG PO TB24
30.0000 mg | ORAL_TABLET | Freq: Every day | ORAL | Status: DC
  Administered 2017-11-05: 30 mg via ORAL
  Filled 2017-11-05: qty 1

## 2017-11-05 MED ORDER — ASPIRIN 81 MG PO CHEW: 81.0000 mg | CHEWABLE_TABLET | Freq: Every day | ORAL | 1 refills | Status: DC

## 2017-11-05 NOTE — Progress Notes (Signed)
[facility]  ROUNDING NOTE   Subjective:  Blood pressure fluctuating. Blood pressure was as low as 101/69. Overall feeling much better as compared to admission.   Objective:  Vital signs in last 24 hours:  Temp:  [97.6 F (36.4 C)-98.2 F (36.8 C)] 97.6 F (36.4 C) (08/23 0915) Pulse Rate:  [64-85] 64 (08/23 1101) Resp:  [16-18] 16 (08/23 1101) BP: (101-185)/(69-105) 101/69 (08/23 1101) SpO2:  [98 %-100 %] 98 % (08/23 1101)  Weight change:  Filed Weights   11/01/17 0349  Weight: 102.1 kg    Intake/Output: I/O last 3 completed shifts: In: 240 [P.O.:240] Out: 1250 [Urine:1250]   Intake/Output this shift:  No intake/output data recorded.  Physical Exam: General: No acute distress  Head: Normocephalic, atraumatic. Moist oral mucosal membranes  Eyes: Anicteric  Neck: Supple, trachea midline  Lungs:  Clear to auscultation, normal effort  Heart: S1S2 no rubs  Abdomen:  Soft, nontender, bowel sounds present  Extremities: No peripheral edema.  Neurologic: Awake, alert, following commands  Skin: No lesions       Basic Metabolic Panel: Recent Labs  Lab 11/01/17 0350 11/01/17 0650 11/02/17 0242 11/03/17 0435 11/04/17 0513 11/05/17 0704  NA 139  --  136 139 138 136  K 3.0*  --  3.4* 3.2* 3.6 3.6  CL 101  --  100 104 104 103  CO2 29  --  29 28 24 24   GLUCOSE 130*  --  127* 103* 98 99  BUN 36*  --  44* 39* 45* 49*  CREATININE 3.16*  --  3.05* 2.68* 2.69* 2.82*  CALCIUM 9.0  --  8.9 9.0 9.1 8.9  MG  --  2.2  --  2.1  --   --   PHOS  --  1.5* 5.4* 4.6  --   --     Liver Function Tests: No results for input(s): AST, ALT, ALKPHOS, BILITOT, PROT, ALBUMIN in the last 168 hours. No results for input(s): LIPASE, AMYLASE in the last 168 hours. No results for input(s): AMMONIA in the last 168 hours.  CBC: Recent Labs  Lab 11/01/17 0350 11/02/17 0242 11/05/17 0704  WBC 14.8* 22.4* 12.3*  HGB 12.6* 10.3* 11.9*  HCT 35.2* 28.8* 33.7*  MCV 86.8 87.1  88.6  PLT 217 199 244    Cardiac Enzymes: Recent Labs  Lab 11/01/17 0350 11/01/17 0650 11/01/17 0930  TROPONINI 0.19* 0.22* 0.17*    BNP: Invalid input(s): POCBNP  CBG: Recent Labs  Lab 11/01/17 0756  GLUCAP 192*    Microbiology: No results found for this or any previous visit.  Coagulation Studies: No results for input(s): LABPROT, INR in the last 72 hours.  Urinalysis: No results for input(s): COLORURINE, LABSPEC, PHURINE, GLUCOSEU, HGBUR, BILIRUBINUR, KETONESUR, PROTEINUR, UROBILINOGEN, NITRITE, LEUKOCYTESUR in the last 72 hours.  Invalid input(s): APPERANCEUR    Imaging: US Renal Artery Duplex Complete  Result Date: 11/03/2017 CLINICAL DATA:  Hypertensive emergency EXAM: RENAL/URINARY TRACT ULTRASOUND RENAL DUPLEX DOPPLER ULTRASOUND COMPARISON:  10/09/2012, 11/01/2017 FINDINGS: Right Kidney: Length: 9.3 cm. Mild increased echogenicity. No mass or hydronephrosis. Left Kidney: Length: 9.8 cm. Echogenicity within normal limits. No mass or hydronephrosis visualized. Bladder:  Within normal limits. RENAL DUPLEX ULTRASOUND Right Renal Artery Velocities: Origin:  174 cm/sec Mid:  200 cm/sec Hilum:  133 cm/sec Interlobar:  31 cm/sec Arcuate:  19 cm/sec Left Renal Artery Velocities: Origin:  114 cm/sec Mid:  112 cm/sec Hilum:  140 cm/sec Interlobar:  24 cm/sec Arcuate:  20 cm/sec Aortic Velocity:  150 cm/sec Right Renal-Aortic Ratios: Origin: 1.16 Mid:  1.34 Hilum: 0.89 Interlobar: 0.21 Arcuate: 0.13 Left Renal-Aortic Ratios: Origin: 0.76 Mid: 0.75 Hilum: 0.93 Interlobar: 0.16 Arcuate: 0.14 IMPRESSION: No significant renal artery stenosis detected by renal duplex. Mild increased right kidney echogenicity suggesting medical renal disease. No hydronephrosis on either side. Small pleural effusions present bilaterally. Electronically Signed   By: Jerilynn Mages.  Shick M.D.   On: 11/03/2017 12:36     Medications:    . amLODipine  10 mg Oral Daily  . aspirin  81 mg Oral Daily  . carvedilol   25 mg Oral BID WC  . cloNIDine  0.2 mg Oral BID  . hydrALAZINE  100 mg Oral Q8H  . isosorbide mononitrate  30 mg Oral Daily   acetaminophen **OR** acetaminophen, bisacodyl, hydrALAZINE, HYDROcodone-acetaminophen, ondansetron **OR** ondansetron (ZOFRAN) IV, senna-docusate  Assessment/ Plan:  32 y.o. male with a PMHx of hypertension, history of substance abuse, depression, who was admitted to [facility] on 11/01/2017 for evaluation of significant shortness of breath.    1.  Acute renal failure. 2.  Proteinuria. 3.  Hypertension, severe, not on outpatient medications.  4.  Chest pain with shortness of breath.   Plan:  SPECT that there is some underlying chronic kidney disease.  We will evaluate kidney function as an outpatient.  He also has underlying proteinuria.  Ideally we would like to add an ACE inhibitor or ARB but we will hold off given low blood pressure 101/69 now.  Otherwise continue current doses of amlodipine, carvedilol, clonidine, hydralazine, and Sorbide mononitrate.  I have advised the patient that we do need to see him in the office within the next 10 days.  He verbalized understanding of this.  Further plan as patient progresses.   LOS: 4 Phillippa Straub 8/23/201911:31 AM

## 2017-11-05 NOTE — Progress Notes (Signed)
BP 185/105 at 0915, BP medications given, BP 101/69, patient is sleepy upon reassessment, wife concerned on patient's increased sleepiness, paged Dr. Bridgett Larsson of concern.

## 2017-11-05 NOTE — Plan of Care (Signed)

## 2017-11-05 NOTE — Progress Notes (Addendum)
I agree with the note written below with the addition of: Pt K has been stable. No replacement needed for the last few days. Pt is not on medications that lower K, nor is having GI loss. Therefore pharmacy will sign off of consult.  Ramond Dial, Pharm.D, [place] for Electrolyte Monitoring and Replacement  Pharmacy consulted for electrolyte monitoring and replacement in 31yo male admitted with Hypertensive urgency/emergency, elevated troponin,  pulmonary edema, and AKI. Patient had hypokalemia and hypophosphatemia on admission.   Labs: 8/23 K 3.6  Assessment/Plan: No supplementation needed today.  Pharmacy will continue to follow while patient remains inpatient.  Tawnya Crook, PharmD Pharmacy Resident  11/05/2017 9:39 AM

## 2017-11-05 NOTE — Discharge Instructions (Signed)
Smoking cessation  

## 2017-11-05 NOTE — Progress Notes (Signed)
Patient's BP 99/58, HR 65, patient stating he's having doubled vision, notified Dr. Bridgett Larsson, fluids initiated prior, no new orders received.

## 2017-11-05 NOTE — Progress Notes (Signed)
Notified Dr. Jerelyn Charles of BP 105/49, HR 62. No new orders received.

## 2017-11-05 NOTE — Progress Notes (Signed)
Progress Note  Patient Name: Jeremiah Keith Date of Encounter: 11/05/2017  Primary Cardiologist: Kathlyn Sacramento, MD  Subjective   No chest pain or sob.  Ambulating w/o difficulty.  Eager to go home.  Inpatient Medications    Scheduled Meds: . amLODipine  10 mg Oral Daily  . aspirin  81 mg Oral Daily  . carvedilol  25 mg Oral BID WC  . cloNIDine  0.1 mg Oral BID  . hydrALAZINE  100 mg Oral Q8H  . isosorbide mononitrate  30 mg Oral Daily   Continuous Infusions:  PRN Meds: acetaminophen **OR** acetaminophen, bisacodyl, hydrALAZINE, HYDROcodone-acetaminophen, ondansetron **OR** ondansetron (ZOFRAN) IV, senna-docusate   Vital Signs    Vitals:   11/04/17 1736 11/04/17 1748 11/04/17 1939 11/05/17 0703  BP: (!) 143/88 (!) 141/86 (!) 148/101 (!) 158/101  Pulse:  73 73 85  Resp:   18 18  Temp:   98.2 F (36.8 C) 98 F (36.7 C)  TempSrc:   Oral   SpO2:   99% 100%  Weight:      Height:       No intake or output data in the 24 hours ending 11/05/17 0804 Filed Weights   11/01/17 0349  Weight: 102.1 kg    Physical Exam   GEN: Well nourished, well developed, in no acute distress.  HEENT: Grossly normal.  Neck: Supple, no JVD, carotid bruits, or masses. Cardiac: RRR, no murmurs, rubs, or gallops. No clubbing, cyanosis, edema.  Radials/DP/PT 2+ and equal bilaterally.  Respiratory:  Respirations regular and unlabored, clear to auscultation bilaterally. GI: Soft, nontender, nondistended, BS + x 4. MS: no deformity or atrophy. Skin: warm and dry, no rash. Neuro:  Strength and sensation are intact. Psych: AAOx3.  Normal affect.  Labs    Chemistry Recent Labs  Lab 11/03/17 0435 11/04/17 0513 11/05/17 0704  NA 139 138 136  K 3.2* 3.6 3.6  CL 104 104 103  CO2 28 24 24   GLUCOSE 103* 98 99  BUN 39* 45* 49*  CREATININE 2.68* 2.69* 2.82*  CALCIUM 9.0 9.1 8.9  GFRNONAA 30* 29* 28*  GFRAA 34* 34* 32*  ANIONGAP 7 10 9      Hematology Recent Labs  Lab  11/01/17 0350 11/02/17 0242 11/05/17 0704  WBC 14.8* 22.4* 12.3*  RBC 4.06* 3.31* 3.80*  HGB 12.6* 10.3* 11.9*  HCT 35.2* 28.8* 33.7*  MCV 86.8 87.1 88.6  MCH 31.2 31.2 31.3  MCHC 35.9 35.9 35.3  RDW 13.6 13.8 13.8  PLT 217 199 244    Cardiac Enzymes Recent Labs  Lab 11/01/17 0350 11/01/17 0650 11/01/17 0930  TROPONINI 0.19* 0.22* 0.17*      BNP Recent Labs  Lab 11/01/17 0350  BNP 1,384.0*      Radiology    Nm Myocar Multi W/spect W/wall Motion / Ef  Result Date: 11/03/2017  Abnormal myocardial perfusion stress test due to moderately reduced left ventricular systolic function (LVEF 70-35%).  The left ventricle is dilated with diffuse hypokinesis.  There is no significant ischemia or scar.  This is an intermediate risk study.    US Renal Artery Duplex Complete  Result Date: 11/03/2017 CLINICAL DATA:  Hypertensive emergency EXAM: RENAL/URINARY TRACT ULTRASOUND RENAL DUPLEX DOPPLER ULTRASOUND COMPARISON:  10/09/2012, 11/01/2017 FINDINGS: Right Kidney: Length: 9.3 cm. Mild increased echogenicity. No mass or hydronephrosis. Left Kidney: Length: 9.8 cm. Echogenicity within normal limits. No mass or hydronephrosis visualized. Bladder:  Within normal limits. RENAL DUPLEX ULTRASOUND Right Renal Artery Velocities: Origin:  174  cm/sec Mid:  200 cm/sec Hilum:  133 cm/sec Interlobar:  31 cm/sec Arcuate:  19 cm/sec Left Renal Artery Velocities: Origin:  114 cm/sec Mid:  112 cm/sec Hilum:  140 cm/sec Interlobar:  24 cm/sec Arcuate:  20 cm/sec Aortic Velocity:  150 cm/sec Right Renal-Aortic Ratios: Origin: 1.16 Mid:  1.34 Hilum: 0.89 Interlobar: 0.21 Arcuate: 0.13 Left Renal-Aortic Ratios: Origin: 0.76 Mid: 0.75 Hilum: 0.93 Interlobar: 0.16 Arcuate: 0.14 IMPRESSION: No significant renal artery stenosis detected by renal duplex. Mild increased right kidney echogenicity suggesting medical renal disease. No hydronephrosis on either side. Small pleural effusions present bilaterally.  Electronically Signed   By: Jerilynn Mages.  Shick M.D.   On: 11/03/2017 12:36    Telemetry    No tele - Personally Reviewed  Cardiac Studies   Echo 11/01/2017: Study Conclusions   - Left ventricle: The cavity size was normal. There was moderate   concentric hypertrophy. Systolic function was normal. The   estimated ejection fraction was in the range of 55% to 60%. Wall   motion was normal; there were no regional wall motion   abnormalities. - Aorta: Aortic root dimension: 42 mm (ED). - Aortic root: The aortic root was mildly dilated. - Left atrium: The atrium was mildly dilated. - Right atrium: The atrium was mildly dilated. - Pulmonary arteries: Systolic pressure could not be accurately   estimated. _____________   Carlton Adam Cardiolite 8.21.2019  Abnormal myocardial perfusion stress test due to moderately reduced left ventricular systolic function (LVEF 37-48%). The left ventricle is dilated with diffuse hypokinesis. There is no significant ischemia or scar. This is an intermediate risk study. _____________   Patient Profile     32 y.o. male with history of uncontrolled HTN who was admitted to [facility] with hypertensive urgency with end organ damage and demand ischemia.   Assessment & Plan    1.  Hypertensive emergency with end organ damage:  BP improved - now trending in the 140's-150's/100, though still not at goal.  Renal artery duplex w/o evidence for RAS.  Aldosterone+renin activity and urine metanephrines/catecholamines pending.  I will increase clonidine to 0.2mg  BID.  He will need close outpatient primary care f/u.  2.  Elevated troponin:  Peak of 0.22 in the setting of HTN emergency and AKI.  Suspect demand ischemia.  Not ACS.  Echo w/ nl EF and w/o rwma.  Myoview w/o ischemia/infarct.  No plans for additional ischemic evaluation.  Cont asa,  blocker, hydral/nitrates.  No ace/arb 2/2 AKI.  Check lipids (can be done as outpt).  3.  Acute renal failure:  Likely on top of some degree  of CKD.  Renal following.  No RAS.  Creat slightly higher this AM.  No acei/arb @ this time.  Signed, Murray Hodgkins, NP  11/05/2017, 8:04 AM    For questions or updates, please contact   Please consult [email or web address] for contact info under Cardiology/STEMI.

## 2017-11-05 NOTE — Care Management (Signed)
Spoke with wife, Tia Alert by phone regarding discharge plan. She has completed [place] and [place] application. Instructed her on listening out for phone call from [facility] and taking prescriptions over to [facility] today to get filled. Gave her my cell phone number in the event she has difficulties. Requested attending print prescriptions. Updated primary nurse. Wife had no other concerns.

## 2017-11-05 NOTE — Progress Notes (Addendum)
[place] [facility] [place] NAME: Edgar Reisz    MR#:  [id number]  DATE OF BIRTH:  10/23/1985  SUBJECTIVE:  CHIEF COMPLAINT:   Chief Complaint  Patient presents with  . Respiratory Distress   The patient has no complaints this morning.  He was given high blood pressure medication, blood pressure decreased to 106/57.  He feels dizzy and orthostatic. REVIEW OF SYSTEMS:  CONSTITUTIONAL: No fever, fatigue or weakness.  No headache. EYES: No blurred or double vision.  EARS, NOSE, AND THROAT: No tinnitus or ear pain.  RESPIRATORY: No cough, shortness of breath, wheezing or hemoptysis.  CARDIOVASCULAR: No chest pain, orthopnea, edema.  GASTROINTESTINAL: No nausea, vomiting, diarrhea or abdominal pain.  GENITOURINARY: No dysuria, hematuria.  ENDOCRINE: No polyuria, nocturia,  HEMATOLOGY: No anemia, easy bruising or bleeding SKIN: No rash or lesion. MUSCULOSKELETAL: No joint pain or arthritis.   NEUROLOGIC: No tingling, numbness, weakness.  PSYCHIATRY: No anxiety or depression.   ROS  DRUG ALLERGIES:   Allergies  Allergen Reactions  . Penicillins Rash    Has patient had a PCN reaction causing immediate rash, facial/tongue/throat swelling, SOB or lightheadedness with hypotension: Yes Has patient had a PCN reaction causing severe rash involving mucus membranes or skin necrosis: No Has patient had a PCN reaction that required hospitalization: No Has patient had a PCN reaction occurring within the last 10 years: No If all of the above answers are "NO", then may proceed with Cephalosporin use.     VITALS:  Blood pressure 107/73, pulse 61, temperature 97.6 F (36.4 C), temperature source Oral, resp. rate 16, height 5\' 6"  (1.676 m), weight 102.1 kg, SpO2 96 %.  PHYSICAL EXAMINATION:  GENERAL:  32 y.o.-year-old patient lying in the bed with no acute distress.  EYES: Pupils equal, round, reactive to light and accommodation. No scleral  icterus. Extraocular muscles intact.  HEENT: Head atraumatic, normocephalic. Oropharynx and nasopharynx clear.  NECK:  Supple, no jugular venous distention. No thyroid enlargement, no tenderness.  LUNGS: Normal breath sounds bilaterally, no wheezing, rales,rhonchi or crepitation. No use of accessory muscles of respiration.  CARDIOVASCULAR: S1, S2 normal. No murmurs, rubs, or gallops.  ABDOMEN: Soft, nontender, nondistended. Bowel sounds present. No organomegaly or mass.  EXTREMITIES: No pedal edema, cyanosis, or clubbing.  NEUROLOGIC: Cranial nerves II through XII are intact. Muscle strength 5/5 in all extremities. Sensation intact. Gait not checked.  PSYCHIATRIC: The patient is alert and oriented x 3.  SKIN: No obvious rash, lesion, or ulcer.   Physical Exam LABORATORY PANEL:   CBC Recent Labs  Lab 11/05/17 0704  WBC 12.3*  HGB 11.9*  HCT 33.7*  PLT 244   ------------------------------------------------------------------------------------------------------------------  Chemistries  Recent Labs  Lab 11/03/17 0435  11/05/17 0704  NA 139   < > 136  K 3.2*   < > 3.6  CL 104   < > 103  CO2 28   < > 24  GLUCOSE 103*   < > 99  BUN 39*   < > 49*  CREATININE 2.68*   < > 2.82*  CALCIUM 9.0   < > 8.9  MG 2.1  --   --    < > = values in this interval not displayed.   ------------------------------------------------------------------------------------------------------------------  Cardiac Enzymes Recent Labs  Lab 11/01/17 0650 11/01/17 0930  TROPONINI 0.22* 0.17*   ------------------------------------------------------------------------------------------------------------------  RADIOLOGY:  No results found.  ASSESSMENT AND PLAN:  *Acute hypertensive crisis Weaned Cardene drip.  Blood pressure is  low after taking medication this morning Continue hydralazine, Norvasc, coreg (increased to 25 mg po bid). Added clonidine and imdur.  No Cozaar at this time per Dr.  Holley Raring.  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 Start normal saline IV and follow-up BMP. Renal ultrasound was a negative study No significant renal artery stenosis detected by renal duplex.  *Acute elevated troponins Most likely secondary to above/demand ischemia Echocardiogram noted for normal ejection fraction/normal wall motion Stress test report is remarkable..  *History of substance abuse Conservative medical management  Hypokalemia.  Improved with potassium supplement.  Magnesium level is normal.  Leukocytosis.  Unclear etiology.  Urinalysis is normal.  Improved.  Tobacco abuse.  Smoking cessation was counseled for 3 to 4 minutes.  Discussed with Dr. Jefferson Fuel. All the records are reviewed and case discussed with Care Management/Social Workerr. Management plans discussed with the patient, family and they are in agreement.  CODE STATUS: Full  TOTAL TIME TAKING CARE OF THIS PATIENT: 35 minutes.     POSSIBLE D/C IN 2-3 DAYS, DEPENDING ON CLINICAL CONDITION.   Demetrios Loll M.D on 11/05/2017   Between 7am to 6pm - Pager - [phone]  After 6pm go to [email or web address] - password [email or web address]  Office  [phone]  CC: Primary care physician; [name], MD  Note: This dictation was prepared with Dragon dictation along with smaller phrase technology. Any transcriptional errors that result from this process are unintentional.

## 2017-11-06 LAB — BASIC METABOLIC PANEL
ANION GAP: 8 (ref 5–15)
BUN: 44 mg/dL — AB (ref 6–20)
CHLORIDE: 107 mmol/L (ref 98–111)
CO2: 23 mmol/L (ref 22–32)
Calcium: 8.9 mg/dL (ref 8.9–10.3)
Creatinine, Ser: 2.77 mg/dL — ABNORMAL HIGH (ref 0.61–1.24)
GFR calc Af Amer: 33 mL/min — ABNORMAL LOW (ref >60)
GFR calc non Af Amer: 28 mL/min — ABNORMAL LOW (ref >60)
GLUCOSE: 106 mg/dL — AB (ref 70–99)
POTASSIUM: 3.7 mmol/L (ref 3.5–5.1)
Sodium: 138 mmol/L (ref 135–145)

## 2017-11-06 MED ORDER — CLONIDINE HCL 0.1 MG PO TABS: 0.1000 mg | ORAL_TABLET | Freq: Two times a day (BID) | ORAL | Status: DC

## 2017-11-06 MED ORDER — CARVEDILOL 25 MG PO TABS: 25.0000 mg | ORAL_TABLET | Freq: Two times a day (BID) | ORAL | 1 refills | Status: DC

## 2017-11-06 MED ORDER — CLONIDINE HCL 0.2 MG PO TABS: 0.2000 mg | ORAL_TABLET | Freq: Two times a day (BID) | ORAL | 1 refills | Status: DC

## 2017-11-06 NOTE — Progress Notes (Signed)
Patient discharged home with spouse, verbalized understanding of education. Patient with no complaints.

## 2017-11-06 NOTE — Discharge Summary (Signed)
[facility] [place] [facility] [place] NAME: Jeremiah Keith    MR#:  [id number]  DATE OF BIRTH:  1985-05-03  DATE OF ADMISSION:  11/01/2017 ADMITTING PHYSICIAN: Arta Silence, MD  DATE OF DISCHARGE: 11/07/2017  PRIMARY CARE PHYSICIAN: [name], MD    ADMISSION DIAGNOSIS:  Shortness of breath [R06.02] Acute pulmonary edema (HCC) [J81.0] Acute coronary syndrome (HCC) [I24.9] AKI (acute kidney injury) ([place]) [N17.9] Hypertensive emergency [I16.1] Acute renal failure, unspecified acute renal failure type ([place]) [N17.9]  DISCHARGE DIAGNOSIS:  Active Problems:   Hypertensive emergency   Acute pulmonary edema (HCC)   AKI (acute kidney injury) ([place])   Demand ischemia ([place])   SECONDARY DIAGNOSIS:   Past Medical History:  Diagnosis Date  . Depression    after death of family members  . History of chicken pox   . Hypertension   . Substance abuse ([place])    h/o cocaine abuse    HOSPITAL COURSE:  32 year old male with uncontrolled hypertension who is admitted with hypertensive urgency.  1.  Hypertensive urgency with endorgan damage: Patient was seen and evaluated by cardiology. He will be discharged on Norvasc, clonidine and Coreg with outpatient follow-up.  2.  Elevated troponin: This is due to hypertensive emergency.  Patient was ruled out for ACS. Cardiac stress test showed no ischemia/infarct.  Echo showed normal ejection fraction and no wall motion abnormality. He will continue on aspirin  3.  Acute kidney injury with underlying chronic kidney disease: Creatinine has remained stable.  He will have follow-up with nephrology as outpatient. Duplex did not show evidence of renal artery stenosis.  4. Tobacco dependence: Patient is encouraged to quit smoking. Counseling was provided for 4 minutes.   DISCHARGE CONDITIONS AND DIET:   Stable for discharge on heart healthy diet  CONSULTS OBTAINED:  Treatment Team:  Arta Silence,  MD Anthonette Legato, MD Wellington Hampshire, MD  DRUG ALLERGIES:   Allergies  Allergen Reactions  . Penicillins Rash    Has patient had a PCN reaction causing immediate rash, facial/tongue/throat swelling, SOB or lightheadedness with hypotension: Yes Has patient had a PCN reaction causing severe rash involving mucus membranes or skin necrosis: No Has patient had a PCN reaction that required hospitalization: No Has patient had a PCN reaction occurring within the last 10 years: No If all of the above answers are "NO", then may proceed with Cephalosporin use.     DISCHARGE MEDICATIONS:   Allergies as of 11/06/2017      Reactions   Penicillins Rash   Has patient had a PCN reaction causing immediate rash, facial/tongue/throat swelling, SOB or lightheadedness with hypotension: Yes Has patient had a PCN reaction causing severe rash involving mucus membranes or skin necrosis: No Has patient had a PCN reaction that required hospitalization: No Has patient had a PCN reaction occurring within the last 10 years: No If all of the above answers are "NO", then may proceed with Cephalosporin use.      Medication List    TAKE these medications   amLODipine 10 MG tablet Commonly known as:  NORVASC Take 1 tablet (10 mg total) by mouth daily.   aspirin 81 MG chewable tablet Chew 1 tablet (81 mg total) by mouth daily.   carvedilol 25 MG tablet Commonly known as:  COREG Take 1 tablet (25 mg total) by mouth 2 (two) times daily with a meal.   cloNIDine 0.2 MG tablet Commonly known as:  CATAPRES Take 1 tablet (0.2 mg total)  by mouth 2 (two) times daily.         Today   CHIEF COMPLAINT:  Patient is ready to be discharged home.  No acute events overnight.   VITAL SIGNS:  Blood pressure 129/86, pulse 79, temperature 98.7 F (37.1 C), temperature source Oral, resp. rate 14, height 5\' 6"  (1.676 m), weight 102.1 kg, SpO2 97 %.   REVIEW OF SYSTEMS:  Review of Systems  Constitutional:  Negative.  Negative for chills, fever and malaise/fatigue.  HENT: Negative.  Negative for ear discharge, ear pain, hearing loss, nosebleeds and sore throat.   Eyes: Negative.  Negative for blurred vision and pain.  Respiratory: Negative.  Negative for cough, hemoptysis, shortness of breath and wheezing.   Cardiovascular: Negative.  Negative for chest pain, palpitations and leg swelling.  Gastrointestinal: Negative.  Negative for abdominal pain, blood in stool, diarrhea, nausea and vomiting.  Genitourinary: Negative.  Negative for dysuria.  Musculoskeletal: Negative.  Negative for back pain.  Skin: Negative.   Neurological: Negative for dizziness, tremors, speech change, focal weakness, seizures and headaches.  Endo/Heme/Allergies: Negative.  Does not bruise/bleed easily.  Psychiatric/Behavioral: Negative.  Negative for depression, hallucinations and suicidal ideas.     PHYSICAL EXAMINATION:  GENERAL:  32 y.o.-year-old patient lying in the bed with no acute distress.  NECK:  Supple, no jugular venous distention. No thyroid enlargement, no tenderness.  LUNGS: Normal breath sounds bilaterally, no wheezing, rales,rhonchi  No use of accessory muscles of respiration.  CARDIOVASCULAR: S1, S2 normal. No murmurs, rubs, or gallops.  ABDOMEN: Soft, non-tender, non-distended. Bowel sounds present. No organomegaly or mass.  EXTREMITIES: No pedal edema, cyanosis, or clubbing.  PSYCHIATRIC: The patient is alert and oriented x 3.  SKIN: No obvious rash, lesion, or ulcer.   DATA REVIEW:   CBC Recent Labs  Lab 11/05/17 0704  WBC 12.3*  HGB 11.9*  HCT 33.7*  PLT 244    Chemistries  Recent Labs  Lab 11/03/17 0435  11/06/17 0449  NA 139   < > 138  K 3.2*   < > 3.7  CL 104   < > 107  CO2 28   < > 23  GLUCOSE 103*   < > 106*  BUN 39*   < > 44*  CREATININE 2.68*   < > 2.77*  CALCIUM 9.0   < > 8.9  MG 2.1  --   --    < > = values in this interval not displayed.    Cardiac Enzymes Recent  Labs  Lab 11/01/17 0350 11/01/17 0650 11/01/17 0930  TROPONINI 0.19* 0.22* 0.17*    Microbiology Results  @MICRORSLT48 @  RADIOLOGY:  No results found.    Allergies as of 11/06/2017      Reactions   Penicillins Rash   Has patient had a PCN reaction causing immediate rash, facial/tongue/throat swelling, SOB or lightheadedness with hypotension: Yes Has patient had a PCN reaction causing severe rash involving mucus membranes or skin necrosis: No Has patient had a PCN reaction that required hospitalization: No Has patient had a PCN reaction occurring within the last 10 years: No If all of the above answers are "NO", then may proceed with Cephalosporin use.      Medication List    TAKE these medications   amLODipine 10 MG tablet Commonly known as:  NORVASC Take 1 tablet (10 mg total) by mouth daily.   aspirin 81 MG chewable tablet Chew 1 tablet (81 mg total) by mouth daily.   carvedilol 25  MG tablet Commonly known as:  COREG Take 1 tablet (25 mg total) by mouth 2 (two) times daily with a meal.   cloNIDine 0.2 MG tablet Commonly known as:  CATAPRES Take 1 tablet (0.2 mg total) by mouth 2 (two) times daily.          Management plans discussed with the patient and he is in agreement. Stable for discharge home  Patient should follow up withc cardiology nephrology  CODE STATUS:     Code Status Orders  (From admission, onward)         Start     Ordered   11/01/17 0837  Full code  Continuous     11/01/17 0836        Code Status History    This patient has a current code status but no historical code status.      TOTAL TIME TAKING CARE OF THIS PATIENT: 38 minutes.    Note: This dictation was prepared with Dragon dictation along with smaller phrase technology. Any transcriptional errors that result from this process are unintentional.  Suhaib Guzzo M.D on 11/06/2017 at 8:28 AM  Between 7am to 6pm - Pager - [phone] After 6pm go to [email or web address] -  password [email or web address] [facility]  Office  [phone]  CC: Primary care physician; [name], MD

## 2017-11-06 NOTE — Progress Notes (Signed)
[facility]  ROUNDING NOTE   Subjective:  Since seen at bedside. Blood pressure much better this a.m. Creatinine appears to be stable at 2.7.   Objective:  Vital signs in last 24 hours:  Temp:  [97.6 F (36.4 C)-98.7 F (37.1 C)] 98.7 F (37.1 C) (08/24 0548) Pulse Rate:  [61-83] 79 (08/24 0723) Resp:  [14-18] 14 (08/24 0548) BP: (99-185)/(49-105) 129/86 (08/24 0723) SpO2:  [96 %-100 %] 97 % (08/24 0548)  Weight change:  Filed Weights   11/01/17 0349  Weight: 102.1 kg    Intake/Output: I/O last 3 completed shifts: In: 1272.6 [I.V.:1272.6] Out: -    Intake/Output this shift:  No intake/output data recorded.  Physical Exam: General: No acute distress  Head: Normocephalic, atraumatic. Moist oral mucosal membranes  Eyes: Anicteric  Neck: Supple, trachea midline  Lungs:  Clear to auscultation, normal effort  Heart: S1S2 no rubs  Abdomen:  Soft, nontender, bowel sounds present  Extremities: No peripheral edema.  Neurologic: Awake, alert, following commands  Skin: No lesions       Basic Metabolic Panel: Recent Labs  Lab 11/01/17 0650 11/02/17 0242 11/03/17 0435 11/04/17 0513 11/05/17 0704 11/06/17 0449  NA  --  136 139 138 136 138  K  --  3.4* 3.2* 3.6 3.6 3.7  CL  --  100 104 104 103 107  CO2  --  29 28 24 24 23   GLUCOSE  --  127* 103* 98 99 106*  BUN  --  44* 39* 45* 49* 44*  CREATININE  --  3.05* 2.68* 2.69* 2.82* 2.77*  CALCIUM  --  8.9 9.0 9.1 8.9 8.9  MG 2.2  --  2.1  --   --   --   PHOS 1.5* 5.4* 4.6  --   --   --     Liver Function Tests: No results for input(s): AST, ALT, ALKPHOS, BILITOT, PROT, ALBUMIN in the last 168 hours. No results for input(s): LIPASE, AMYLASE in the last 168 hours. No results for input(s): AMMONIA in the last 168 hours.  CBC: Recent Labs  Lab 11/01/17 0350 11/02/17 0242 11/05/17 0704  WBC 14.8* 22.4* 12.3*  HGB 12.6* 10.3* 11.9*  HCT 35.2* 28.8* 33.7*  MCV 86.8 87.1 88.6  PLT 217 199 244     Cardiac Enzymes: Recent Labs  Lab 11/01/17 0350 11/01/17 0650 11/01/17 0930  TROPONINI 0.19* 0.22* 0.17*    BNP: Invalid input(s): POCBNP  CBG: Recent Labs  Lab 11/01/17 0756  GLUCAP 192*    Microbiology: No results found for this or any previous visit.  Coagulation Studies: No results for input(s): LABPROT, INR in the last 72 hours.  Urinalysis: No results for input(s): COLORURINE, LABSPEC, PHURINE, GLUCOSEU, HGBUR, BILIRUBINUR, KETONESUR, PROTEINUR, UROBILINOGEN, NITRITE, LEUKOCYTESUR in the last 72 hours.  Invalid input(s): APPERANCEUR    Imaging: No results found.   Medications:   . sodium chloride 100 mL/hr at 11/05/17 1410   . amLODipine  10 mg Oral Daily  . aspirin  81 mg Oral Daily  . carvedilol  25 mg Oral Q12H  . cloNIDine  0.2 mg Oral BID  . hydrALAZINE  100 mg Oral Q8H  . isosorbide mononitrate  30 mg Oral Daily   acetaminophen **OR** acetaminophen, bisacodyl, hydrALAZINE, HYDROcodone-acetaminophen, ondansetron **OR** ondansetron (ZOFRAN) IV, senna-docusate  Assessment/ Plan:  32 y.o. male with a PMHx of hypertension, history of substance abuse, depression, who was admitted to [facility] on 11/01/2017 for evaluation of significant shortness of breath.  1.  Acute renal failure. 2.  Proteinuria. 3.  Hypertension, severe, not on outpatient medications.  4.  Chest pain with shortness of breath.   Plan:  Creatinine currently 2.7 with an EGFR of 28.  Blood pressure under much better control.  Maintain the patient on amlodipine, carvedilol, clonidine, hydralazine, and isosorbide mononitrate.  We will likely add an ACE inhibitor or ARB as an outpatient.  Disposition as per hospitalist.   LOS: 5 Tifanie Gardiner 8/24/20197:52 AM

## 2017-11-06 NOTE — Care Management (Signed)
Patient without insurance. Medication management application given. Patient will discharge [facility], [facility] closed. All discharging medications are on [facility] $4 list. Patient agreeable to fill scripts at [facility] this weekend and at [facility] in the future. No further RNCM needs.  Ines Bloomer RN BSN RNCM [phone]

## 2017-11-07 ENCOUNTER — Other Ambulatory Visit: Payer: Self-pay

## 2017-11-07 ENCOUNTER — Emergency Department (HOSPITAL_COMMUNITY)
Admission: EM | Admit: 2017-11-07 | Discharge: 2017-11-08 | Disposition: A | Discharge status: Home / Self Care | Payer: Self-pay | Attending: Emergency Medicine | Admitting: Emergency Medicine

## 2017-11-07 DIAGNOSIS — N189 Chronic kidney disease, unspecified: Secondary | ICD-10-CM | POA: Insufficient documentation

## 2017-11-07 DIAGNOSIS — I160 Hypertensive urgency: Secondary | ICD-10-CM | POA: Insufficient documentation

## 2017-11-07 DIAGNOSIS — I129 Hypertensive chronic kidney disease with stage 1 through stage 4 chronic kidney disease, or unspecified chronic kidney disease: Secondary | ICD-10-CM | POA: Insufficient documentation

## 2017-11-07 DIAGNOSIS — Z7982 Long term (current) use of aspirin: Secondary | ICD-10-CM | POA: Insufficient documentation

## 2017-11-07 DIAGNOSIS — F329 Major depressive disorder, single episode, unspecified: Secondary | ICD-10-CM | POA: Insufficient documentation

## 2017-11-07 DIAGNOSIS — R778 Other specified abnormalities of plasma proteins: Secondary | ICD-10-CM

## 2017-11-07 DIAGNOSIS — R7989 Other specified abnormal findings of blood chemistry: Secondary | ICD-10-CM | POA: Insufficient documentation

## 2017-11-07 DIAGNOSIS — F17210 Nicotine dependence, cigarettes, uncomplicated: Secondary | ICD-10-CM | POA: Insufficient documentation

## 2017-11-07 LAB — ALDOSTERONE + RENIN ACTIVITY W/ RATIO
ALDO / PRA Ratio: 0.4 (ref 0.0–30.0)
Aldosterone: 4.3 ng/dL (ref 0.0–30.0)
PRA LC/MS/MS: 12 ng/mL/hr — ABNORMAL HIGH (ref 0.167–5.380)

## 2017-11-08 ENCOUNTER — Emergency Department (HOSPITAL_COMMUNITY): Payer: Self-pay

## 2017-11-08 ENCOUNTER — Encounter (HOSPITAL_COMMUNITY): Payer: Self-pay | Admitting: *Deleted

## 2017-11-08 ENCOUNTER — Other Ambulatory Visit: Payer: Self-pay

## 2017-11-08 LAB — BASIC METABOLIC PANEL
Anion gap: 10 (ref 5–15)
BUN: 33 mg/dL — AB (ref 6–20)
CHLORIDE: 104 mmol/L (ref 98–111)
CO2: 22 mmol/L (ref 22–32)
Calcium: 9.1 mg/dL (ref 8.9–10.3)
Creatinine, Ser: 2.64 mg/dL — ABNORMAL HIGH (ref 0.61–1.24)
GFR calc Af Amer: 35 mL/min — ABNORMAL LOW (ref >60)
GFR calc non Af Amer: 30 mL/min — ABNORMAL LOW (ref >60)
GLUCOSE: 117 mg/dL — AB (ref 70–99)
POTASSIUM: 3.6 mmol/L (ref 3.5–5.1)
SODIUM: 136 mmol/L (ref 135–145)

## 2017-11-08 LAB — CBC
HEMATOCRIT: 30.7 % — AB (ref 39.0–52.0)
Hemoglobin: 10.4 g/dL — ABNORMAL LOW (ref 13.0–17.0)
MCH: 30.9 pg (ref 26.0–34.0)
MCHC: 33.9 g/dL (ref 30.0–36.0)
MCV: 91.1 fL (ref 78.0–100.0)
Platelets: 216 10*3/uL (ref 150–400)
RBC: 3.37 MIL/uL — ABNORMAL LOW (ref 4.22–5.81)
RDW: 12.3 % (ref 11.5–15.5)
WBC: 12.1 10*3/uL — AB (ref 4.0–10.5)

## 2017-11-08 LAB — I-STAT TROPONIN, ED: Troponin i, poc: 0.11 ng/mL (ref 0.00–0.08)

## 2017-11-08 LAB — TROPONIN I: Troponin I: 0.08 ng/mL (ref <0.03)

## 2017-11-08 MED ORDER — CARVEDILOL 12.5 MG PO TABS
25.0000 mg | ORAL_TABLET | Freq: Once | ORAL | Status: AC
  Administered 2017-11-08: 25 mg via ORAL
  Filled 2017-11-08: qty 2

## 2017-11-08 MED ORDER — NITROGLYCERIN 2 % TD OINT
1.0000 [in_us] | TOPICAL_OINTMENT | Freq: Four times a day (QID) | TRANSDERMAL | Status: DC
  Administered 2017-11-08: 1 [in_us] via TOPICAL
  Filled 2017-11-08: qty 1

## 2017-11-08 MED ORDER — ASPIRIN 81 MG PO CHEW
324.0000 mg | CHEWABLE_TABLET | Freq: Once | ORAL | Status: AC
  Administered 2017-11-08: 324 mg via ORAL
  Filled 2017-11-08: qty 4

## 2017-11-08 MED ORDER — ASPIRIN 81 MG PO CHEW: 324.0000 mg | CHEWABLE_TABLET | Freq: Once | ORAL | Status: DC

## 2017-11-08 MED ORDER — CLONIDINE HCL 0.2 MG PO TABS
0.2000 mg | ORAL_TABLET | Freq: Once | ORAL | Status: AC
  Administered 2017-11-08: 0.2 mg via ORAL
  Filled 2017-11-08: qty 1

## 2017-11-08 NOTE — ED Notes (Signed)
PA [facility]aph[facility] informed of troponin results .[place]

## 2017-11-08 NOTE — Discharge Instructions (Signed)
We saw you in the ER for the chest pain/shortness of breath. All of our cardiac workup is normal, including labs, EKG and chest X-RAY do not show a heart attack.   We are not sure what is causing your discomfort, but we feel comfortable sending you home at this time. The workup in the ER is not complete, and you should follow up with your cardiologist for further assessment.  Please return to the ER if you have worsening chest pain, shortness of breath, pain radiating to your jaw, shoulder, or back, sweats or fainting. Otherwise see the Cardiologist or your primary care doctor as requested.

## 2017-11-08 NOTE — ED Triage Notes (Signed)
Pt was discharged for Jeremiah Keith on Friday for chest pain. Reports chest pain  Started again tonight while watching TV. Pain located on L sided, worsens with movement and yawning. Described as a stabbing pain, also reports blurry vision as well

## 2017-11-08 NOTE — ED Notes (Signed)
PT states understanding of care given, follow up care. pt has no questions at this time. PT ambulated from ED to car with a steady gait.

## 2017-11-08 NOTE — ED Notes (Signed)
Pt states that he has been taking his medication as prescribed but has been tired and sleepy from the medications and has not been able to do much in the past days.

## 2017-11-08 NOTE — ED Provider Notes (Signed)
[facility] EMERGENCY DEPARTMENT Provider Note   CSN: [id number] Arrival date & time: 11/07/17  2357     History   Chief Complaint Chief Complaint  Patient presents with  . Chest Pain    HPI Jeremiah Keith Jeremiah Keith is a 32 y.o. male.  HPI 32 year old male comes in with chief complaint of chest pain Patient has history of hypertension, remote history of substance abuse, questionable diastolic CHF, CKD and recent admission for hypertensive emergency with elevated troponin that yielded negative stress test.   Patient reports that this afternoon he started having chest pain.  Chest pain is described as stabbing type pain on the left side of his chest.  Pain has been constant, with associated shortness of breath.  Patient denies any orthopnea or paroxysmal nocturnal dyspnea.  He also denies any associated nausea, diaphoresis, dizziness.  Patient decided to come to [facility] for further evaluation.  His blood pressure is noted to be slightly elevated.  He states that he has not taken his evening medications.  He has been compliant with rest of his medications since the time of discharge 2 days ago.  Pt has no hx of PE, DVT and denies any exogenous hormone (testosterone / estrogen) use, long distance travels or surgery in the past 6 weeks, active cancer, recent immobilization.  Patient has not used any drugs in over 6 years.  He does have family history of coronary artery disease.  Past Medical History:  Diagnosis Date  . Depression    after death of family members  . History of chicken pox   . Hypertension   . Substance abuse [facility])    h/o cocaine abuse    Patient Active Problem List   Diagnosis Date Noted  . AKI (acute kidney injury) ([place])   . Demand ischemia ([place])   . Hypertensive emergency 11/01/2017  . Acute pulmonary edema (HCC)   . Sciatica 05/05/2015  . History of pneumonia 06/13/2014  . HTN (hypertension) 06/13/2014    Past Surgical  History:  Procedure Laterality Date  . TONSILLECTOMY AND ADENOIDECTOMY  1990        Home Medications    Prior to Admission medications   Medication Sig Start Date End Date Taking? Authorizing Provider  amLODipine (NORVASC) 10 MG tablet Take 1 tablet (10 mg total) by mouth daily. 11/05/17  Yes Demetrios Loll, MD  aspirin 81 MG chewable tablet Chew 1 tablet (81 mg total) by mouth daily. 11/05/17  Yes Demetrios Loll, MD  carvedilol (COREG) 25 MG tablet Take 1 tablet (25 mg total) by mouth 2 (two) times daily with a meal. 11/06/17  Yes Mody, Sital, MD  cloNIDine (CATAPRES) 0.2 MG tablet Take 1 tablet (0.2 mg total) by mouth 2 (two) times daily. 11/06/17  Yes Bettey Costa, MD    Family History Family History  Problem Relation Age of Onset  . Heart disease Mother   . Diabetes Father   . Hypertension Father   . Prostate cancer Maternal Grandfather        possible prostate cancer  . Colon cancer Neg Hx     Social History Social History   Tobacco Use  . Smoking status: Current Every Day Smoker    Packs/day: 25.00    Types: Cigarettes  . Smokeless tobacco: Never Used  Substance Use Topics  . Alcohol use: Yes    Alcohol/week: 0.0 standard drinks  . Drug use: Not Currently     Allergies   Penicillins   Review  of Systems Review of Systems  Constitutional: Positive for activity change.  Respiratory: Positive for shortness of breath.   Cardiovascular: Positive for chest pain.  Gastrointestinal: Negative for nausea and vomiting.  Neurological: Negative for dizziness.  All other systems reviewed and are negative.    Physical Exam Updated Vital Signs BP (!) 144/95   Pulse 78   Temp 98.5 F (36.9 C) (Oral)   Resp 20   Ht 5' 7.5" (1.715 m)   Wt 104.3 kg   SpO2 99%   BMI 35.49 kg/m   Physical Exam  Constitutional: He is oriented to person, place, and time. He appears well-developed.  HENT:  Head: Atraumatic.  Eyes: EOM are normal.  Neck: Neck supple.  Cardiovascular:  Normal rate, intact distal pulses and normal pulses.  Pulmonary/Chest: Effort normal and breath sounds normal. No accessory muscle usage or stridor. No tachypnea. No respiratory distress.  Abdominal: Soft.  Neurological: He is alert and oriented to person, place, and time.  Skin: Skin is warm.  Nursing note and vitals reviewed.    ED Treatments / Results  Labs (all labs ordered are listed, but only abnormal results are displayed) Labs Reviewed  BASIC METABOLIC PANEL - Abnormal; Notable for the following components:      Result Value   Glucose, Bld 117 (*)    BUN 33 (*)    Creatinine, Ser 2.64 (*)    GFR calc non Af Amer 30 (*)    GFR calc Af Amer 35 (*)    All other components within normal limits  CBC - Abnormal; Notable for the following components:   WBC 12.1 (*)    RBC 3.37 (*)    Hemoglobin 10.4 (*)    HCT 30.7 (*)    All other components within normal limits  TROPONIN I - Abnormal; Notable for the following components:   Troponin I 0.08 (*)    All other components within normal limits  I-STAT TROPONIN, ED - Abnormal; Notable for the following components:   Troponin i, poc 0.11 (*)    All other components within normal limits    EKG EKG Interpretation  Date/Time:  Monday November 08 2017 00:00:13 EDT Ventricular Rate:  75 PR Interval:  134 QRS Duration: 144 QT Interval:  422 QTC Calculation: 471 R Axis:   -35 Text Interpretation:  Normal sinus rhythm Left axis deviation Right bundle branch block T wave abnormality, consider lateral ischemia Abnormal ECG rsr anterior leads Nonspecific ST and T wave abnormality twi in the lateral leads Confirmed by Varney Biles [phone]) on 11/08/2017 12:51:24 AM   Radiology Dg Chest 2 View  Result Date: 11/08/2017 CLINICAL DATA:  Chest pain EXAM: CHEST - 2 VIEW COMPARISON:  11/01/2017 FINDINGS: Cardiomegaly. No confluent airspace opacities, effusions or edema. No acute bony abnormality. IMPRESSION: Cardiomegaly.  No active  disease. Electronically Signed   By: Rolm Baptise M.D.   On: 11/08/2017 00:32    Procedures Procedures (including critical care time)  Medications Ordered in ED Medications  nitroGLYCERIN (NITROGLYN) 2 % ointment 1 inch (1 inch Topical Given 11/08/17 0126)  cloNIDine (CATAPRES) tablet 0.2 mg (0.2 mg Oral Given 11/08/17 0123)  carvedilol (COREG) tablet 25 mg (25 mg Oral Given 11/08/17 0123)  aspirin chewable tablet 324 mg (324 mg Oral Given 11/08/17 0123)     Initial Impression / Assessment and Plan / ED Course  I have reviewed the triage vital signs and the nursing notes.  Pertinent labs & imaging results that were available during  my care of the patient were reviewed by me and considered in my medical decision making (see chart for details).  Clinical Course as of Nov 09 334  Mon Nov 08, 2017  0300 I spoke with Dr. De Nurse, cardiology. We reviewed patient's presenting symptoms of chest pain along with nonspecific changes on the EKG.  We also discussed and reviewed patient's recent admission, and the echocardiogram and stress test results.  Dr. De Nurse does not recommend any further work-up from cardiology perspective. He would want one more troponin I level, and if the value is not significantly higher and patient is chest pain-free, then he would recommend discharging the patient.   [AN]  0328 I just went and spoke with the patient about recommendations from cardiology.  Patient reports that he is chest pain resolved already, few minutes after he got the blood pressure medications.  Patient's blood pressure has come down to 563J systolic.  Patient does not want to stay for repeat troponin. Since he is pain-free, I feel comfortable with his wishes.  We will discharge him.  He will follow-up with nephrologist and cardiologist as recommended at the time of discharge.   [AN]    Clinical Course User Index [AN] Varney Biles, MD    33 year old male comes in with chief complaint of  chest pain.  Patient was recently admitted to the hospital for chest pain and hypertensive emergency.  Due to his elevated troponin, he had a stress test and an echocardiogram, which were overall reassuring.  Patient is noted to be hypertensive again.  His troponin is slightly elevated, but is at baseline.  It seems like the troponin is a result of his CKD.  Patient's creatinine is unchanged.  Patient is still having some chest discomfort, therefore we will observe him in the ED.  Patient has not taken his evening hypertension medications, therefore they will be provided whilst patient is in the ED.   No clinical concerns for PE.  Patient has not use any drugs in several years, has a normal vascular exam, no focal neurologic complaints therefore I do not think patient is having dissection.. Final Clinical Impressions(s) / ED Diagnoses   Final diagnoses:  Hypertensive urgency  Chronic kidney disease, unspecified CKD stage  Elevated troponin    ED Discharge Orders    None       Varney Biles, MD 11/08/17 [phone]

## 2017-11-09 ENCOUNTER — Telehealth: Payer: Self-pay | Admitting: Pharmacy Technician

## 2017-11-09 NOTE — Telephone Encounter (Signed)
Provided patient with new patient packet to obtain [facility] services.  Patient understands that [facility] must receive 2019 financial documentation in order to determine eligibility.  Debborah Alonge J. Riyanshi Wahab Care Manager [facility] 

## 2017-11-11 LAB — CATECHOLAMINES,UR.,FREE,24 HR
Dopamine, Rand Ur: 25 ug/L
Dopamine, Ur, 24Hr: 153 ug/24 hr (ref 0–510)
EPINEPHRINE RAND UR: 1 ug/L
EPINEPHRINE, U, 24HR: 6 ug/(24.h) (ref 0–20)
NOREPINEPHRINE,U,24H: 61 ug/(24.h) (ref 0–135)
Norepinephrine, Rand Ur: 10 ug/L

## 2017-11-11 LAB — METANEPHRINES, URINE, 24 HOUR
Metaneph Total, Ur: 29 ug/L
Metanephrines, 24H Ur: 177 ug/24 hr (ref 45–290)
NORMETANEPHRINE 24H UR: 1147 ug/(24.h) — AB (ref 82–500)
NORMETANEPHRINE UR: 188 ug/L

## 2017-11-16 ENCOUNTER — Ambulatory Visit: Payer: Self-pay | Admitting: Cardiovascular Disease

## 2017-11-18 ENCOUNTER — Ambulatory Visit: Payer: Self-pay | Admitting: Nurse Practitioner

## 2017-11-19 ENCOUNTER — Inpatient Hospital Stay: Payer: Self-pay | Admitting: Family Medicine

## 2018-08-30 ENCOUNTER — Ambulatory Visit: Payer: Self-pay | Admitting: Family Medicine

## 2018-08-31 ENCOUNTER — Other Ambulatory Visit: Payer: Self-pay

## 2018-09-01 ENCOUNTER — Encounter: Payer: Self-pay | Admitting: Family Medicine

## 2018-09-01 ENCOUNTER — Ambulatory Visit: Payer: Self-pay | Admitting: Family Medicine

## 2018-09-01 VITALS — BP 264/140 | HR 97 | Temp 98.1°F | Ht 67.5 in | Wt 207.6 lb

## 2018-09-01 DIAGNOSIS — R0602 Shortness of breath: Secondary | ICD-10-CM

## 2018-09-01 DIAGNOSIS — N189 Chronic kidney disease, unspecified: Secondary | ICD-10-CM

## 2018-09-01 DIAGNOSIS — I10 Essential (primary) hypertension: Secondary | ICD-10-CM

## 2018-09-01 LAB — COMPREHENSIVE METABOLIC PANEL
ALT: 15 U/L (ref 0–53)
AST: 20 U/L (ref 0–37)
Albumin: 4.8 g/dL (ref 3.5–5.2)
Alkaline Phosphatase: 72 U/L (ref 39–117)
BUN: 30 mg/dL — ABNORMAL HIGH (ref 6–23)
CO2: 27 mEq/L (ref 19–32)
Calcium: 9.9 mg/dL (ref 8.4–10.5)
Chloride: 98 mEq/L (ref 96–112)
Creatinine, Ser: 2.57 mg/dL — ABNORMAL HIGH (ref 0.40–1.50)
GFR: 28.7 mL/min — ABNORMAL LOW (ref >60.00)
Glucose, Bld: 122 mg/dL — ABNORMAL HIGH (ref 70–99)
Potassium: 3.4 mEq/L — ABNORMAL LOW (ref 3.5–5.1)
Sodium: 135 mEq/L (ref 135–145)
Total Bilirubin: 1.1 mg/dL (ref 0.2–1.2)
Total Protein: 7.1 g/dL (ref 6.0–8.3)

## 2018-09-01 LAB — CBC WITH DIFFERENTIAL/PLATELET
Basophils Absolute: 0 10*3/uL (ref 0.0–0.1)
Basophils Relative: 0.4 % (ref 0.0–3.0)
Eosinophils Absolute: 0 10*3/uL (ref 0.0–0.7)
Eosinophils Relative: 0.1 % (ref 0.0–5.0)
HCT: 42.1 % (ref 39.0–52.0)
Hemoglobin: 14.6 g/dL (ref 13.0–17.0)
Lymphocytes Relative: 11.5 % — ABNORMAL LOW (ref 12.0–46.0)
Lymphs Abs: 1.3 10*3/uL (ref 0.7–4.0)
MCHC: 34.6 g/dL (ref 30.0–36.0)
MCV: 84.5 fl (ref 78.0–100.0)
Monocytes Absolute: 0.6 10*3/uL (ref 0.1–1.0)
Monocytes Relative: 5 % (ref 3.0–12.0)
Neutro Abs: 9.3 10*3/uL — ABNORMAL HIGH (ref 1.4–7.7)
Neutrophils Relative %: 83 % — ABNORMAL HIGH (ref 43.0–77.0)
Platelets: 203 10*3/uL (ref 150.0–400.0)
RBC: 4.98 Mil/uL (ref 4.22–5.81)
RDW: 13.9 % (ref 11.5–15.5)
WBC: 11.2 10*3/uL — ABNORMAL HIGH (ref 4.0–10.5)

## 2018-09-01 LAB — BRAIN NATRIURETIC PEPTIDE: Pro B Natriuretic peptide (BNP): 3280 pg/mL — ABNORMAL HIGH (ref 0.0–100.0)

## 2018-09-01 MED ORDER — CARVEDILOL 25 MG PO TABS: 25.0000 mg | ORAL_TABLET | Freq: Two times a day (BID) | ORAL | 1 refills | Status: DC

## 2018-09-01 MED ORDER — AMLODIPINE BESYLATE 10 MG PO TABS: 10.0000 mg | ORAL_TABLET | Freq: Every day | ORAL | 1 refills | Status: DC

## 2018-09-01 MED ORDER — CLONIDINE HCL 0.2 MG PO TABS: 0.2000 mg | ORAL_TABLET | Freq: Two times a day (BID) | ORAL | 1 refills | Status: DC

## 2018-09-01 NOTE — Patient Instructions (Addendum)
Go to the lab on the way out.  We'll contact you with your lab report. Restart your BP meds and update me about your BP in a few days.  If you have more chest pain or shortness of breath or swelling then go to the hospital or dial 911.    Take care.  Glad to see you.

## 2018-09-01 NOTE — Progress Notes (Signed)
He ran out of BP meds about 2 weeks ago.  Prev Cr elevation noted and discussed with patient.  SOB in the last few weeks, around the time his meds ran out.  No CP.  No BLE edema.  Sometimes he'll have orthopnea.    He has a 35 year old son.   He doesn't use cocaine per patient report.  Cautions re: meds d/w pt. he is aware that cocaine use along with beta-blockers could lead to serious injury and/or death.  PMH and SH reviewed  ROS: Per HPI unless specifically indicated in ROS section   Meds, vitals, and allergies reviewed.   GEN: nad, alert and oriented HEENT: mucous membranes moist NECK: supple w/o LA CV: rrr.  No murmur noted.  PULM: ctab, no inc wob ABD: soft, +bs EXT: no edema SKIN: no acute rash

## 2018-09-02 ENCOUNTER — Other Ambulatory Visit: Payer: Self-pay | Admitting: Family Medicine

## 2018-09-02 DIAGNOSIS — N189 Chronic kidney disease, unspecified: Secondary | ICD-10-CM

## 2018-09-02 DIAGNOSIS — I10 Essential (primary) hypertension: Secondary | ICD-10-CM

## 2018-09-04 ENCOUNTER — Encounter: Payer: Self-pay | Admitting: Family Medicine

## 2018-09-04 DIAGNOSIS — N189 Chronic kidney disease, unspecified: Secondary | ICD-10-CM | POA: Insufficient documentation

## 2018-09-04 NOTE — Assessment & Plan Note (Signed)
See notes on repeat labs. 

## 2018-09-04 NOTE — Assessment & Plan Note (Addendum)
Longstanding severe hypertension.  This is the first time I've seen him in 3+ years.  Discussed options.    Has been off of medications and felt worse in the meantime.    Advised to restart home blood pressure medications and update me about his BP in a few days.  If chest pain or shortness of breath or swelling then go to the hospital or dial 911.     See notes on labs.

## 2018-09-13 ENCOUNTER — Ambulatory Visit (INDEPENDENT_AMBULATORY_CARE_PROVIDER_SITE_OTHER): Payer: Self-pay | Admitting: Family Medicine

## 2018-09-13 ENCOUNTER — Ambulatory Visit: Payer: Self-pay | Admitting: Family Medicine

## 2018-09-13 ENCOUNTER — Encounter: Payer: Self-pay | Admitting: Family Medicine

## 2018-09-13 ENCOUNTER — Other Ambulatory Visit: Payer: Self-pay

## 2018-09-13 VITALS — BP 178/96 | HR 64 | Temp 97.5°F | Ht 67.5 in | Wt 218.0 lb

## 2018-09-13 DIAGNOSIS — I10 Essential (primary) hypertension: Secondary | ICD-10-CM

## 2018-09-13 DIAGNOSIS — N189 Chronic kidney disease, unspecified: Secondary | ICD-10-CM

## 2018-09-13 DIAGNOSIS — H538 Other visual disturbances: Secondary | ICD-10-CM

## 2018-09-13 LAB — BASIC METABOLIC PANEL
BUN: 26 mg/dL — ABNORMAL HIGH (ref 6–23)
CO2: 27 mEq/L (ref 19–32)
Calcium: 8.6 mg/dL (ref 8.4–10.5)
Chloride: 106 mEq/L (ref 96–112)
Creatinine, Ser: 3.13 mg/dL — ABNORMAL HIGH (ref 0.40–1.50)
GFR: 22.86 mL/min — ABNORMAL LOW (ref >60.00)
Glucose, Bld: 114 mg/dL — ABNORMAL HIGH (ref 70–99)
Potassium: 4.2 mEq/L (ref 3.5–5.1)
Sodium: 139 mEq/L (ref 135–145)

## 2018-09-13 LAB — BRAIN NATRIURETIC PEPTIDE: Pro B Natriuretic peptide (BNP): 307 pg/mL — ABNORMAL HIGH (ref 0.0–100.0)

## 2018-09-13 MED ORDER — CLONIDINE HCL 0.2 MG PO TABS: 0.2000 mg | ORAL_TABLET | Freq: Three times a day (TID) | ORAL | Status: DC

## 2018-09-13 NOTE — Progress Notes (Signed)
HAs have resolved now that he is back on his baseline blood pressure medications but L eye with blurry vision for about 2-3 months.  He is episodically SOB but that is better than prev.  No CP.  Not BLE edema. He feels better on med.    Cr and BNP elevation d/w pt. he has significant chronic kidney disease and BNP elevation/shortness of breath that I assume is related to uncontrolled high blood pressure.  We had a reasonable and supportive conversation that was still frank and honest, with the point being that I expect him to progress to fulminant kidney failure and/or other serious endorgan situations if he does not have adequate control of his blood pressure.  He understood that he can either die or have significant disability related to uncontrolled high blood pressure/sequela.  He has renal and cardiac follow-up pending.  PMH and SH reviewed  ROS: Per HPI unless specifically indicated in ROS section   Meds, vitals, and allergies reviewed.   GEN: nad, alert and oriented HEENT: ncat NECK: supple w/o LA CV: rrr. PULM: ctab, no inc wob ABD: soft, +bs EXT: no edema SKIN: no acute rash Limited fundus exam, nondilated but no hemorrhages seen B.

## 2018-09-13 NOTE — Patient Instructions (Addendum)
Go to the lab on the way out.  We'll contact you with your lab report. We'll call about an eye exam.   Keep the cardiology and kidney clinic appointments.  Don't take aleve or ibuprofen.  Change clonidine to 1 tab 3 times a day.   Update me about your BP on/after 7/13, sooner if needed.  We may be able to change the clonidine to 0.3mg  twice a day, later on.  Take care.  Glad to see you.

## 2018-09-14 NOTE — Assessment & Plan Note (Signed)
Cr and BNP elevation d/w pt. he has significant chronic kidney disease and BNP elevation/shortness of breath that I assume is related to uncontrolled high blood pressure.  We had a reasonable and supportive conversation that was still frank and honest, with the point being that I expect him to progress to fulminant kidney failure and/or other serious endorgan situations if he does not have adequate control of his blood pressure.  He understood that he can either die or have significant disability related to uncontrolled high blood pressure/sequela.  >25 minutes spent in face to face time with patient, >50% spent in counselling or coordination of care.

## 2018-09-14 NOTE — Assessment & Plan Note (Signed)
Cr and BNP elevation d/w pt. he has significant chronic kidney disease and BNP elevation/shortness of breath that I assume is related to uncontrolled high blood pressure.  We had a reasonable and supportive conversation that was still frank and honest, with the point being that I expect him to progress to fulminant kidney failure and/or other serious endorgan situations if he does not have adequate control of his blood pressure.  He understood that he can either die or have significant disability related to uncontrolled high blood pressure/sequela.  He has cardiology and renal follow-up pending.  I also want him to check with the eye clinic given the blurry vision that he has had over the last few months.  He does not have acute vision loss and he still okay for outpatient follow-up.  NSAID cautions given to patient Change clonidine to 1 tab 3 times a day.   He can update me about BP on/after 7/13, sooner if needed.  We may be able to change the clonidine to 0.3mg  twice a day, later on.   >25 minutes spent in face to face time with patient, >50% spent in counselling or coordination of care.

## 2018-09-19 ENCOUNTER — Other Ambulatory Visit: Payer: Self-pay | Admitting: Ophthalmology

## 2018-09-19 DIAGNOSIS — H3412 Central retinal artery occlusion, left eye: Secondary | ICD-10-CM

## 2018-09-23 ENCOUNTER — Ambulatory Visit
Admission: RE | Admit: 2018-09-23 | Discharge: 2018-09-23 | Disposition: A | Discharge status: Home / Self Care | Payer: Self-pay | Source: Ambulatory Visit | Attending: Ophthalmology | Admitting: Ophthalmology

## 2018-09-23 ENCOUNTER — Other Ambulatory Visit: Payer: Self-pay

## 2018-09-23 DIAGNOSIS — H3412 Central retinal artery occlusion, left eye: Secondary | ICD-10-CM | POA: Insufficient documentation

## 2018-11-02 ENCOUNTER — Ambulatory Visit: Payer: Self-pay | Admitting: Internal Medicine

## 2018-11-02 NOTE — Progress Notes (Deleted)
New Outpatient Visit Date: 11/02/2018  Referring Provider: [name], MD [place],  [place] [address]  Chief Complaint: ***  HPI:  Mr. Knoell is a 33 y.o. male who is being seen today for the evaluation of uncontrolled hypertension at the request of Dr. Damita Dunnings. He has a history of poorly controlled hypertension, chronic kidney disease, depression, and substance abuse.  He was hospitalized at [facility] in 10/2017 with hypertensive emergency.  Elevation was also noted during that visit.  Pharmacologic myocardial perfusion stress test showed moderately reduced LVEF without evidence of ischemia or scar.  Echo at that time showed normal LVEF.  Renal artery Doppler was negative for renal artery stenosis.  Serum aldosterone to plasma renin activity ratio was normal.  24-hour urine metanephrines were slightly elevated.  --------------------------------------------------------------------------------------------------  Cardiovascular History & Procedures: Cardiovascular Problems:  Uncontrolled hypertension  Risk Factors:  Retention, male gender, and obesity  Cath/PCI:  None  CV Surgery:  None  EP Procedures and Devices:  None  Non-Invasive Evaluation(s):  Pharmacologic MPI (11/03/2017): Abnormal pharmacologic myocardial perfusion stress test due to LVEF 40-45%.  Left ventricle appears dilated with diffuse hypokinesis.  No ischemia or scar noted.  This is an intermediate risk study.  TTE (11/01/2017): Normal LV size with moderate LVH.  LVEF 55-60% with normal wall motion.  Mildly dilated aortic root, measuring 4.2 cm.  Mild biatrial enlargement.  Normal RV size and function.  Recent CV Pertinent Labs: Lab Results  Component Value Date   INR 1.14 11/01/2017   BNP 1,384.0 (H) 11/01/2017   K 4.2 09/13/2018   K 3.9 04/12/2014   MG 2.1 11/03/2017   BUN 26 (H) 09/13/2018   BUN 13 04/12/2014   CREATININE 3.13 (H) 09/13/2018   CREATININE 1.50 (H) 05/03/2015    --------------------------------------------------------------------------------------------------  Past Medical History:  Diagnosis Date  . CKD (chronic kidney disease)   . Depression    after death of family members  . History of chicken pox   . Hypertension   . Substance abuse ([place])    h/o cocaine abuse    Past Surgical History:  Procedure Laterality Date  . TONSILLECTOMY AND ADENOIDECTOMY  1990    No outpatient medications have been marked as taking for the 11/02/18 encounter (Appointment) with Randel Hargens, Harrell Gave, MD.    Allergies: Nsaids and Penicillins  Social History   Tobacco Use  . Smoking status: Current Every Day Smoker    Packs/day: 25.00    Types: Cigarettes  . Smokeless tobacco: Never Used  Substance Use Topics  . Alcohol use: Yes    Alcohol/week: 0.0 standard drinks  . Drug use: Not Currently    Family History  Problem Relation Age of Onset  . Heart disease Mother   . Diabetes Father   . Hypertension Father   . Prostate cancer Maternal Grandfather        possible prostate cancer  . Colon cancer Neg Hx     Review of Systems: A 12-system review of systems was performed and was negative except as noted in the HPI.  --------------------------------------------------------------------------------------------------  Physical Exam: There were no vitals taken for this visit.  General:  *** HEENT: No conjunctival pallor or scleral icterus. Moist mucous membranes. OP clear. Neck: Supple without lymphadenopathy, thyromegaly, JVD, or HJR. No carotid bruit. Lungs: Normal work of breathing. Clear to auscultation bilaterally without wheezes or crackles. Heart: Regular rate and rhythm without murmurs, rubs, or gallops. Non-displaced PMI. Abd: Bowel sounds present. Soft, NT/ND without hepatosplenomegaly  Ext: No lower extremity edema. Radial, PT, and DP pulses are 2+ bilaterally Skin: Warm and dry without rash. Neuro: CNIII-XII intact. Strength and  fine-touch sensation intact in upper and lower extremities bilaterally. Psych: Normal mood and affect.  EKG:  ***  Lab Results  Component Value Date   WBC 11.2 (H) 09/01/2018   HGB 14.6 09/01/2018   HCT 42.1 09/01/2018   MCV 84.5 09/01/2018   PLT 203.0 09/01/2018    Lab Results  Component Value Date   NA 139 09/13/2018   K 4.2 09/13/2018   CL 106 09/13/2018   CO2 27 09/13/2018   BUN 26 (H) 09/13/2018   CREATININE 3.13 (H) 09/13/2018   GLUCOSE 114 (H) 09/13/2018   ALT 15 09/01/2018    No results found for: CHOL, HDL, LDLCALC, LDLDIRECT, TRIG, CHOLHDL   --------------------------------------------------------------------------------------------------  ASSESSMENT AND PLAN: ***  Nelva Bush, MD 11/02/2018 1:10 PM

## 2018-11-06 ENCOUNTER — Emergency Department
Admission: EM | Admit: 2018-11-06 | Discharge: 2018-11-06 | Disposition: A | Discharge status: Home / Self Care | Payer: Self-pay | Attending: Emergency Medicine | Admitting: Emergency Medicine

## 2018-11-06 ENCOUNTER — Other Ambulatory Visit: Payer: Self-pay

## 2018-11-06 DIAGNOSIS — Z7982 Long term (current) use of aspirin: Secondary | ICD-10-CM | POA: Insufficient documentation

## 2018-11-06 DIAGNOSIS — M545 Low back pain, unspecified: Secondary | ICD-10-CM

## 2018-11-06 DIAGNOSIS — F17210 Nicotine dependence, cigarettes, uncomplicated: Secondary | ICD-10-CM | POA: Insufficient documentation

## 2018-11-06 DIAGNOSIS — I10 Essential (primary) hypertension: Secondary | ICD-10-CM

## 2018-11-06 DIAGNOSIS — N189 Chronic kidney disease, unspecified: Secondary | ICD-10-CM | POA: Insufficient documentation

## 2018-11-06 DIAGNOSIS — Z79899 Other long term (current) drug therapy: Secondary | ICD-10-CM | POA: Insufficient documentation

## 2018-11-06 DIAGNOSIS — I129 Hypertensive chronic kidney disease with stage 1 through stage 4 chronic kidney disease, or unspecified chronic kidney disease: Secondary | ICD-10-CM | POA: Insufficient documentation

## 2018-11-06 LAB — URINALYSIS, COMPLETE (UACMP) WITH MICROSCOPIC
Bacteria, UA: NONE SEEN
Bilirubin Urine: NEGATIVE
Glucose, UA: NEGATIVE mg/dL
Hgb urine dipstick: NEGATIVE
Ketones, ur: NEGATIVE mg/dL
Leukocytes,Ua: NEGATIVE
Nitrite: NEGATIVE
Protein, ur: 30 mg/dL — AB
Specific Gravity, Urine: 1.014 (ref 1.005–1.030)
Squamous Epithelial / HPF: NONE SEEN (ref 0–5)
pH: 5 (ref 5.0–8.0)

## 2018-11-06 LAB — CBC WITH DIFFERENTIAL/PLATELET
Abs Immature Granulocytes: 0.04 10*3/uL (ref 0.00–0.07)
Basophils Absolute: 0.1 10*3/uL (ref 0.0–0.1)
Basophils Relative: 1 %
Eosinophils Absolute: 0.6 10*3/uL — ABNORMAL HIGH (ref 0.0–0.5)
Eosinophils Relative: 6 %
HCT: 43.5 % (ref 39.0–52.0)
Hemoglobin: 14.8 g/dL (ref 13.0–17.0)
Immature Granulocytes: 0 %
Lymphocytes Relative: 22 %
Lymphs Abs: 2.1 10*3/uL (ref 0.7–4.0)
MCH: 28.6 pg (ref 26.0–34.0)
MCHC: 34 g/dL (ref 30.0–36.0)
MCV: 84 fL (ref 80.0–100.0)
Monocytes Absolute: 0.6 10*3/uL (ref 0.1–1.0)
Monocytes Relative: 6 %
Neutro Abs: 6.4 10*3/uL (ref 1.7–7.7)
Neutrophils Relative %: 65 %
Platelets: 225 10*3/uL (ref 150–400)
RBC: 5.18 MIL/uL (ref 4.22–5.81)
RDW: 13.2 % (ref 11.5–15.5)
WBC: 9.7 10*3/uL (ref 4.0–10.5)
nRBC: 0 % (ref 0.0–0.2)

## 2018-11-06 LAB — BASIC METABOLIC PANEL
Anion gap: 9 (ref 5–15)
BUN: 34 mg/dL — ABNORMAL HIGH (ref 6–20)
CO2: 25 mmol/L (ref 22–32)
Calcium: 9.4 mg/dL (ref 8.9–10.3)
Chloride: 103 mmol/L (ref 98–111)
Creatinine, Ser: 2.89 mg/dL — ABNORMAL HIGH (ref 0.61–1.24)
GFR calc Af Amer: 31 mL/min — ABNORMAL LOW (ref >60)
GFR calc non Af Amer: 27 mL/min — ABNORMAL LOW (ref >60)
Glucose, Bld: 93 mg/dL (ref 70–99)
Potassium: 4.3 mmol/L (ref 3.5–5.1)
Sodium: 137 mmol/L (ref 135–145)

## 2018-11-06 MED ORDER — CYCLOBENZAPRINE HCL 10 MG PO TABS: 10.0000 mg | ORAL_TABLET | Freq: Three times a day (TID) | ORAL | 0 refills | Status: AC | PRN

## 2018-11-06 MED ORDER — AMLODIPINE BESYLATE 5 MG PO TABS
10.0000 mg | ORAL_TABLET | Freq: Once | ORAL | Status: AC
  Administered 2018-11-06: 10 mg via ORAL
  Filled 2018-11-06: qty 2

## 2018-11-06 MED ORDER — TRAMADOL HCL 50 MG PO TABS: 50.0000 mg | ORAL_TABLET | Freq: Four times a day (QID) | ORAL | 0 refills | Status: AC | PRN

## 2018-11-06 MED ORDER — CARVEDILOL 25 MG PO TABS: 25.0000 mg | ORAL_TABLET | Freq: Two times a day (BID) | ORAL | Status: DC

## 2018-11-06 MED ORDER — CLONIDINE HCL 0.1 MG PO TABS
0.2000 mg | ORAL_TABLET | Freq: Once | ORAL | Status: DC
  Filled 2018-11-06: qty 2

## 2018-11-06 NOTE — ED Provider Notes (Addendum)
[facility] Emergency Department Provider Note ____________________________________________   First MD Initiated Contact with Patient 11/06/18 1735     (approximate)  I have reviewed the triage vital signs and the nursing notes.   HISTORY  Chief Complaint Back Pain    HPI Jeremiah Keith is a 33 y.o. male with PMH as noted below including CKD and severe hypertension who presents with bilateral lower back pain, acute onset this morning, nonradiating, and worse with certain movements.  The patient denies any specific trauma but states he may have tweaked it while lifting his son.  He denies any weakness or numbness.  He has no fever, nausea vomiting, or urinary symptoms.  The patient states that he was worried it was related to his kidney disease.  He states he did not take his blood pressure medications today.  Past Medical History:  Diagnosis Date  . CKD (chronic kidney disease)   . Depression    after death of family members  . History of chicken pox   . Hypertension   . Substance abuse [facility])    h/o cocaine abuse    Patient Active Problem List   Diagnosis Date Noted  . CKD (chronic kidney disease) 09/04/2018  . AKI (acute kidney injury) ([place])   . Demand ischemia ([place])   . Hypertensive emergency 11/01/2017  . Acute pulmonary edema ([facility])   . Sciatica 05/05/2015  . History of pneumonia 06/13/2014  . HTN (hypertension) 06/13/2014    Past Surgical History:  Procedure Laterality Date  . TONSILLECTOMY AND ADENOIDECTOMY  1990    Prior to Admission medications   Medication Sig Start Date End Date Taking? Authorizing Provider  amLODipine (NORVASC) 10 MG tablet Take 1 tablet (10 mg total) by mouth daily. 09/01/18   [name], MD  aspirin 81 MG chewable tablet Chew 1 tablet (81 mg total) by mouth daily. 11/05/17   Demetrios Loll, MD  carvedilol (COREG) 25 MG tablet Take 1 tablet (25 mg total) by mouth 2 (two) times daily with a meal. 09/01/18    [name], MD  cloNIDine (CATAPRES) 0.2 MG tablet Take 1 tablet (0.2 mg total) by mouth 3 (three) times daily. 09/13/18   [name], MD  cyclobenzaprine (FLEXERIL) 10 MG tablet Take 1 tablet (10 mg total) by mouth 3 (three) times daily as needed for up to 5 days for muscle spasms. 11/06/18 11/11/18  Arta Silence, MD  irbesartan (AVAPRO) 75 MG tablet Take 75 mg by mouth daily. 10/18/18   [provider]  traMADol (ULTRAM) 50 MG tablet Take 1 tablet (50 mg total) by mouth every 6 (six) hours as needed for up to 5 days. 11/06/18 11/11/18  Arta Silence, MD    Allergies Nsaids and Penicillins  Family History  Problem Relation Age of Onset  . Heart disease Mother   . Diabetes Father   . Hypertension Father   . Prostate cancer Maternal Grandfather        possible prostate cancer  . Colon cancer Neg Hx     Social History Social History   Tobacco Use  . Smoking status: Current Every Day Smoker    Packs/day: 25.00    Types: Cigarettes  . Smokeless tobacco: Never Used  Substance Use Topics  . Alcohol use: Yes    Alcohol/week: 0.0 standard drinks  . Drug use: Not Currently    Review of Systems  Constitutional: No fever. Eyes: No redness. ENT: No neck pain. Cardiovascular: Denies chest  pain. Respiratory: Denies shortness of breath. Gastrointestinal: No vomiting or diarrhea.  Genitourinary: Negative for dysuria.  Musculoskeletal: Pa positive for back pain. Skin: Negative for rash. Neurological: Negative for focal weakness or numbness.   ____________________________________________   PHYSICAL EXAM:  VITAL SIGNS: ED Triage Vitals [11/06/18 1700]  Enc Vitals Group     BP (!) 216/122     Pulse Rate 60     Resp 14     Temp 98.3 F (36.8 C)     Temp Source Oral     SpO2 99 %     Weight 218 lb (98.9 kg)     Height      Head Circumference      Peak Flow      Pain Score 8     Pain Loc      Pain Edu?      Excl. in [place]?     Constitutional:  Alert and oriented. Well appearing and in no acute distress. Eyes: Conjunctivae are normal.  Head: Atraumatic. Nose: No congestion/rhinnorhea. Mouth/Throat: Mucous membranes are moist.   Neck: Normal range of motion.  Cardiovascular: Normal rate, regular rhythm. Good peripheral circulation. Respiratory: Normal respiratory effort.  No retractions.  Gastrointestinal:  No distention.  Genitourinary: No CVA tenderness. Musculoskeletal:  Extremities warm and well perfused.  Bilateral lumbar paraspinal muscle tenderness with no midline spinal tenderness. Neurologic:  Normal speech and language.  5/5 motor strength and intact sensation of bilateral lower extremities.  Normal gait.   Skin:  Skin is warm and dry. No rash noted. Psychiatric: Mood and affect are normal. Speech and behavior are normal.  ____________________________________________   LABS (all labs ordered are listed, but only abnormal results are displayed)  Labs Reviewed  BASIC METABOLIC PANEL - Abnormal; Notable for the following components:      Result Value   BUN 34 (*)    Creatinine, Ser 2.89 (*)    GFR calc non Af Amer 27 (*)    GFR calc Af Amer 31 (*)    All other components within normal limits  CBC WITH DIFFERENTIAL/PLATELET - Abnormal; Notable for the following components:   Eosinophils Absolute 0.6 (*)    All other components within normal limits  URINALYSIS, COMPLETE (UACMP) WITH MICROSCOPIC - Abnormal; Notable for the following components:   Color, Urine YELLOW (*)    APPearance CLEAR (*)    Protein, ur 30 (*)    All other components within normal limits   ____________________________________________  EKG  ED ECG REPORT I, Arta Silence, the attending physician, personally viewed and interpreted this ECG.  Date: 11/06/2018 EKG Time: 1922 Rate: 45 Rhythm: Sinus bradycardia QRS Axis: Left axis Intervals: RBBB ST/T Wave abnormalities: normal Narrative Interpretation: no evidence of acute ischemia   ____________________________________________  RADIOLOGY    ____________________________________________   PROCEDURES  Procedure(s) performed: No  Procedures  Critical Care performed: No ____________________________________________   INITIAL IMPRESSION / ASSESSMENT AND PLAN / ED COURSE  Pertinent labs & imaging results that were available during my care of the patient were reviewed by me and considered in my medical decision making (see chart for details).  33 year old male with history of CKD and hypertension presents with bilateral lower back pain with no significant associated symptoms and no radiation.  I reviewed the past medical records in Epic and confirmed the patient's history of CKD as well as his hypertension for which he is on several medications, amlodipine, clonidine, and Coreg.  The patient states that he did not take  his blood pressure medications today.  On exam, the patient is very well-appearing.  He is significantly hypertensive with otherwise normal vital signs.  He has mild reproducible bilateral lumbar muscular tenderness with no midline spinal tenderness and no neuro deficits.  The remainder of the exam is unremarkable.  Overall presentation is consistent with muscular low back pain.  Given his significant hypertension I will obtain basic labs, as well as a urinalysis to rule out urinary etiology.  Given the patient's overall well appearance, the reproducible nature of the pain, and otherwise normal vital signs, there is no indication for AAA, renal infarct, or other vascular or hypertension related etiology.  I will give the patient a p.o. dose of his normal medications first and then reassess the blood pressure.  ----------------------------------------- 8:11 PM on 11/06/2018 -----------------------------------------  The lab work-up is unremarkable and the patient's creatinine is slightly better than his recent baseline.  Urinalysis is negative.  The  patient's blood pressure has remained significantly elevated.  He declined clonidine because he feels like it makes him sleepy.  Overall presentation is consistent with muscular low back pain.  The patient states he wants to go home.  I advised him that I was not comfortable with the very high blood pressure.  He states that his blood pressure is always like this and that he will continue to take his medications and follow-up with his doctor.  He denies any other new symptoms and there is no evidence of hypertensive emergency.  However I am signing patient out [place].  He understands that without getting better control of his blood pressure he is at risk for permanent damage to his kidneys, heart, or other conditions causing permanent disability or death.  He is able to paraphrase these risks back to me and demonstrates appropriate decision-making capacity.  Return precautions provided.  He understands he may return at any time if he changes his mind and wishes to resume care.  ____________________________________________   FINAL CLINICAL IMPRESSION(S) / ED DIAGNOSES  Final diagnoses:  Acute bilateral low back pain without sciatica  Hypertension, unspecified type      NEW MEDICATIONS STARTED DURING THIS VISIT:  Discharge Medication List as of 11/06/2018  8:14 PM    START taking these medications   Details  cyclobenzaprine (FLEXERIL) 10 MG tablet Take 1 tablet (10 mg total) by mouth 3 (three) times daily as needed for up to 5 days for muscle spasms., Starting Sun 11/06/2018, Until Fri 11/11/2018, Normal    traMADol (ULTRAM) 50 MG tablet Take 1 tablet (50 mg total) by mouth every 6 (six) hours as needed for up to 5 days., Starting Sun 11/06/2018, Until Fri 11/11/2018, Normal         Note:  This document was prepared using Dragon voice recognition software and may include unintentional dictation errors.    Arta Silence, MD 11/06/18 2013    Arta Silence, MD  11/06/18 [phone]

## 2018-11-06 NOTE — Discharge Instructions (Signed)
You should take Tylenol for pain along with the Flexeril (cyclobenzaprine).  If you have more severe pain you can take a tramadol but you should take this as little as possible.  You must avoid ibuprofen, Naprosyn, Aleve, or any other anti-inflammatories known as "NSAIDs."  Take your blood pressure medications as prescribed and follow-up with your primary care doctor.  Return to the ER for new, worsening, or persistent severe back pain, weakness or numbness, severe headache, chest pain, or any other new or worsening symptoms that concern you.

## 2018-11-06 NOTE — ED Notes (Signed)
Pt states he wants to leave. md over to hallway bed to speak with pt.

## 2018-11-06 NOTE — ED Triage Notes (Signed)
Pt presents via POV c/o bilateral lower back pain. Reports intermittently. Worse with movement "sometimes" per pt. Reports hx stage 4 CKD. Denies dysuria.

## 2018-12-12 ENCOUNTER — Other Ambulatory Visit: Payer: Self-pay | Admitting: *Deleted

## 2018-12-12 MED ORDER — IRBESARTAN 75 MG PO TABS: 75.0000 mg | ORAL_TABLET | Freq: Every day | ORAL | 0 refills | Status: DC

## 2018-12-12 MED ORDER — AMLODIPINE BESYLATE 10 MG PO TABS: 10.0000 mg | ORAL_TABLET | Freq: Every day | ORAL | 0 refills | Status: DC

## 2018-12-12 MED ORDER — CLONIDINE HCL 0.2 MG PO TABS: 0.2000 mg | ORAL_TABLET | Freq: Three times a day (TID) | ORAL | 0 refills | Status: DC

## 2018-12-12 MED ORDER — CARVEDILOL 25 MG PO TABS: 25.0000 mg | ORAL_TABLET | Freq: Two times a day (BID) | ORAL | 0 refills | Status: DC

## 2018-12-12 NOTE — Telephone Encounter (Signed)
Patient advised.

## 2018-12-12 NOTE — Telephone Encounter (Signed)
Sent.  Needs f/u here at least. Thanks.

## 2018-12-12 NOTE — Telephone Encounter (Signed)
Patient called stating that he needs refills on all of his medications and has been out of them for 4 days. Patient stated that he does not have any insurance and was not able to go to his heart doctor. Patient stated that he did go to the eye doctor and they did not find anything wrong with them. See lab results 09/13/18. Asked patient if he has had his blood pressure checked and he stated no that he is too busy and working all the time. See ER visit 11/06/2018.

## 2019-04-17 ENCOUNTER — Ambulatory Visit (INDEPENDENT_AMBULATORY_CARE_PROVIDER_SITE_OTHER): Payer: Medicaid Other | Admitting: Family Medicine

## 2019-04-17 ENCOUNTER — Other Ambulatory Visit: Payer: Self-pay

## 2019-04-17 ENCOUNTER — Encounter: Payer: Self-pay | Admitting: Family Medicine

## 2019-04-17 VITALS — BP 240/138 | HR 83 | Temp 96.6°F | Ht 67.5 in | Wt 232.1 lb

## 2019-04-17 DIAGNOSIS — I10 Essential (primary) hypertension: Secondary | ICD-10-CM

## 2019-04-17 LAB — BASIC METABOLIC PANEL
BUN: 25 mg/dL — ABNORMAL HIGH (ref 6–23)
CO2: 28 mEq/L (ref 19–32)
Calcium: 9.3 mg/dL (ref 8.4–10.5)
Chloride: 105 mEq/L (ref 96–112)
Creatinine, Ser: 2.8 mg/dL — ABNORMAL HIGH (ref 0.40–1.50)
GFR: 25.9 mL/min — ABNORMAL LOW (ref >60.00)
Glucose, Bld: 80 mg/dL (ref 70–99)
Potassium: 3.7 mEq/L (ref 3.5–5.1)
Sodium: 139 mEq/L (ref 135–145)

## 2019-04-17 MED ORDER — AMLODIPINE BESYLATE 10 MG PO TABS: 10.0000 mg | ORAL_TABLET | Freq: Every day | ORAL | 1 refills | Status: DC

## 2019-04-17 MED ORDER — CARVEDILOL 25 MG PO TABS: 25.0000 mg | ORAL_TABLET | Freq: Two times a day (BID) | ORAL | 1 refills | Status: DC

## 2019-04-17 MED ORDER — IRBESARTAN 75 MG PO TABS: 75.0000 mg | ORAL_TABLET | Freq: Every day | ORAL | 1 refills | Status: DC

## 2019-04-17 MED ORDER — CLONIDINE HCL 0.2 MG PO TABS: 0.2000 mg | ORAL_TABLET | Freq: Three times a day (TID) | ORAL | 1 refills | Status: DC

## 2019-04-17 NOTE — Progress Notes (Signed)
This visit occurred during the SARS-CoV-2 public health emergency.  Safety protocols were in place, including screening questions prior to the visit, additional usage of staff PPE, and extensive cleaning of exam room while observing appropriate contact time as indicated for disinfecting solutions.  I asked patient to update me about his blood pressure after the last office visit.  I have not had communication from the patient in the meantime.  Discussed.  Hypertension/CKD:  Using medication without problems or lightheadedness: yes Chest pain with exertion: no Edema:no Short of breath: yes, worse since he ran out of meds and his blood pressure increased No syncope.    He has blurry vision in L eye at baseline.  This has been stable for months.  We talked about his situation with CKD and BP and f/u, med adherence.  He has financial difficulty.    He has applied for medicaid.  He is working at [employer].    ROS: Per HPI unless specifically indicated in ROS section   Meds, vitals, and allergies reviewed.   GEN: nad, alert and oriented HEENT: ncat NECK: supple w/o LA CV: rrr. PULM: ctab, no inc wob ABD: soft, +bs EXT: no edema SKIN: no acute rash  See after visit summary.

## 2019-04-17 NOTE — Patient Instructions (Signed)
Go to the lab on the way out.   If you have mychart we'll likely use that to update you.    Please see if you qualify for medicaid.  I can check on options here for med cost help.  Restart your meds and update me about your BP later this week.  Take care.  Glad to see you.

## 2019-04-19 ENCOUNTER — Other Ambulatory Visit: Payer: Self-pay | Admitting: Family Medicine

## 2019-04-19 DIAGNOSIS — I10 Essential (primary) hypertension: Secondary | ICD-10-CM

## 2019-04-19 DIAGNOSIS — N189 Chronic kidney disease, unspecified: Secondary | ICD-10-CM

## 2019-04-19 NOTE — Assessment & Plan Note (Signed)
Severe hypertension, chronically noted.  In the setting of chronic kidney disease.  We have had discussions about this before.  Without treatment then I expect him to go on to develop worsening kidney failure or other systemic problems.  See previous conversations.  He acknowledges this.  Check labs today restart his medications.  Prescription sent.  Routine cautions given to patient.  I will check on options to help the patient with finances.  He is checking to see if he can get Medicaid.  See notes on labs.

## 2019-04-20 ENCOUNTER — Telehealth: Payer: Self-pay | Admitting: Family Medicine

## 2019-04-20 NOTE — Telephone Encounter (Signed)
Noted. Thanks.

## 2019-04-20 NOTE — Telephone Encounter (Signed)
Contacted pt and advised of apt tomorrow. Pt agreed to apt at 8:15. Advised if any SOB, chest pain, jaw pain, radiating arm pain or nausea call EMS or go to the ER. Pt verbalized understanding.

## 2019-04-20 NOTE — Telephone Encounter (Signed)
Pt called back with bp readings  04/18/19  212/123 04/19/19  196/127 04/20/19  220/119 3am 04/20/19  176/106           10:43am  Spoke with Terri Skains sent pt to [facility]

## 2019-04-20 NOTE — Telephone Encounter (Signed)
Romie Minus @ [facility] called back  Pt outcome from triage was to be seen in office within 24 hours Pt is asystematic Pt disconnected call.  Do you want pt to schedule follow up appointment

## 2019-04-21 ENCOUNTER — Encounter: Payer: Self-pay | Admitting: Family Medicine

## 2019-04-21 ENCOUNTER — Other Ambulatory Visit: Payer: Self-pay

## 2019-04-21 ENCOUNTER — Ambulatory Visit (INDEPENDENT_AMBULATORY_CARE_PROVIDER_SITE_OTHER): Payer: Self-pay | Admitting: Family Medicine

## 2019-04-21 VITALS — BP 180/110 | HR 62 | Temp 96.5°F | Ht 67.5 in | Wt 233.2 lb

## 2019-04-21 DIAGNOSIS — I10 Essential (primary) hypertension: Secondary | ICD-10-CM

## 2019-04-21 MED ORDER — HYDRALAZINE HCL 25 MG PO TABS: 25.0000 mg | ORAL_TABLET | Freq: Three times a day (TID) | ORAL | 1 refills | Status: DC

## 2019-04-21 NOTE — Progress Notes (Signed)
This visit occurred during the SARS-CoV-2 public health emergency.  Safety protocols were in place, including screening questions prior to the visit, additional usage of staff PPE, and extensive cleaning of exam room while observing appropriate contact time as indicated for disinfecting solutions.  Hypertension:    Using medication without problems or lightheadedness: yes but he has to take his meds with food to prevent nausea.   Chest pain with exertion: no Edema:no Short of breath: no Average home BPs: he hasn't had AM meds today, just got off work.   Lowest BP he has seen has been 176/106 on home check. Prev sweats got better when on medicine.    Meds, vitals, and allergies reviewed.  PMH and SH reviewed  ROS: Per HPI unless specifically indicated in ROS section   GEN: nad, alert and oriented HEENT: ncat NECK: supple w/o LA CV: rrr. PULM: ctab, no inc wob ABD: soft, +bs EXT: no edema SKIN: no acute rash

## 2019-04-21 NOTE — Patient Instructions (Signed)
Add on hydralazine 25mg  3 times a day.   Keep taking your other meds.   Update me about your BP next week.  We'll need to see about other options at that point.  Take care.  Glad to see you.

## 2019-04-23 NOTE — Assessment & Plan Note (Signed)
Not at goal.  Previously referred to cardiology, nephrology,  financial assistance.  Discussed. Add on hydralazine 25mg  3 times a day.   No change in baseline medications otherwise. I want him to update me about his BP next week.  We'll need to see about other options at that point.  I do not know if he will be able to be transitioned over to a clonidine patch versus other options.  We will see when he updates me about his blood pressure.  Routine cautions given to patient.

## 2019-04-24 ENCOUNTER — Telehealth: Payer: Self-pay

## 2019-04-24 NOTE — Telephone Encounter (Signed)
04/24/2019 Left message on voicemail for patient to return my call. Ambrose Mantle [phone]

## 2019-04-24 NOTE — Telephone Encounter (Signed)
Noted. Thanks.

## 2019-04-25 ENCOUNTER — Ambulatory Visit (INDEPENDENT_AMBULATORY_CARE_PROVIDER_SITE_OTHER): Payer: Self-pay | Admitting: Cardiology

## 2019-04-25 ENCOUNTER — Other Ambulatory Visit: Payer: Self-pay

## 2019-04-25 ENCOUNTER — Encounter: Payer: Self-pay | Admitting: Cardiology

## 2019-04-25 VITALS — BP 150/90 | HR 49 | Ht 67.0 in | Wt 234.0 lb

## 2019-04-25 DIAGNOSIS — F17200 Nicotine dependence, unspecified, uncomplicated: Secondary | ICD-10-CM

## 2019-04-25 DIAGNOSIS — I10 Essential (primary) hypertension: Secondary | ICD-10-CM

## 2019-04-25 DIAGNOSIS — I77810 Thoracic aortic ectasia: Secondary | ICD-10-CM

## 2019-04-25 DIAGNOSIS — N184 Chronic kidney disease, stage 4 (severe): Secondary | ICD-10-CM

## 2019-04-25 MED ORDER — IRBESARTAN 150 MG PO TABS: 150.0000 mg | ORAL_TABLET | Freq: Every day | ORAL | 1 refills | Status: DC

## 2019-04-25 MED ORDER — CARVEDILOL 12.5 MG PO TABS: 12.5000 mg | ORAL_TABLET | Freq: Two times a day (BID) | ORAL | 1 refills | Status: DC

## 2019-04-25 NOTE — Patient Instructions (Signed)
Medication Instructions:  Your physician has recommended you make the following change in your medication:  1- DECREASE Carvedilol to 12.5 mg by mouth two times a day. 2- INCREASE Irbesartan to 150 mg by mouth once a day.  *If you need a refill on your cardiac medications before your next appointment, please call your pharmacy*  Lab Work: none If you have labs (blood work) drawn today and your tests are completely normal, you will receive your results only by: Marland Kitchen MyChart Message (if you have MyChart) OR . A paper copy in the mail If you have any lab test that is abnormal or we need to change your treatment, we will call you to review the results.  Testing/Procedures: None Please let us know when you are ready to schedule an echocardiogram.  Follow-Up: At [facility], you and your health needs are our priority.  As part of our continuing mission to provide you with exceptional heart care, we have created designated Provider Care Teams.  These Care Teams include your primary Cardiologist (physician) and Advanced Practice Providers (APPs -  Physician Assistants and Nurse Practitioners) who all work together to provide you with the care you need, when you need it.  Your next appointment:   3 month(s)  The format for your next appointment:   In Person  Provider:   [name], MD

## 2019-04-25 NOTE — Progress Notes (Signed)
Cardiology Office Note:    Date:  04/25/2019   ID:  Jeremiah Keith, DOB [birth year], MRN [id number]  PCP:  [name], MD  Cardiologist:  [name], MD  Electrophysiologist:  None   Referring MD: [name], MD   Chief Complaint  Patient presents with  . New Patient (Initial Visit)    Referred by PCP for elevated BP. Meds reviewed verbally with patient.     History of Present Illness:    Jeremiah Keith is a 34 y.o. male with a hx of CKD, hypertension, substance abuse/history of cocaine use, current smoker who is being seen for hypertension.  Patient states having difficult to control blood pressures.  He was diagnosed with hypertension about 5 years ago.  His blood pressures used to be in the 123456 systolic.  He was started on medications with improvement but blood pressure still not at goal.  He is a current smoker and working on quitting.  Denies chest pain or shortness of breath at rest or with exertion.  He has some insurance issues and is working on Psychologist, prison and probation services to help cover cost for his healthcare.  His mother currently helps him with paying for his medications.  Transthoracic echocardiogram report from 10/2017 showed normal systolic function with EF of 55 to 60%, moderate LVH, aortic root was mildly dilated at 42 mm,  Past Medical History:  Diagnosis Date  . CKD (chronic kidney disease)   . Depression    after death of family members  . History of chicken pox   . Hypertension   . Substance abuse ([place])    h/o cocaine abuse    Past Surgical History:  Procedure Laterality Date  . TONSILLECTOMY AND ADENOIDECTOMY  1990    Current Medications: Current Meds  Medication Sig  . amLODipine (NORVASC) 10 MG tablet Take 1 tablet (10 mg total) by mouth daily.  . cloNIDine (CATAPRES) 0.2 MG tablet Take 1 tablet (0.2 mg total) by mouth 3 (three) times daily.  . hydrALAZINE (APRESOLINE) 25 MG tablet Take 1 tablet (25 mg total) by mouth 3  (three) times daily.  . [DISCONTINUED] carvedilol (COREG) 25 MG tablet Take 1 tablet (25 mg total) by mouth 2 (two) times daily with a meal.  . [DISCONTINUED] irbesartan (AVAPRO) 75 MG tablet Take 1 tablet (75 mg total) by mouth daily.     Allergies:   Nsaids and Penicillins   Social History   Socioeconomic History  . Marital status: Married    Spouse name: Not on file  . Number of children: Not on file  . Years of education: 90  . Highest education level: Not on file  Occupational History  . Not on file  Tobacco Use  . Smoking status: Current Every Day Smoker    Packs/day: 25.00    Types: Cigarettes  . Smokeless tobacco: Never Used  Substance and Sexual Activity  . Alcohol use: Yes    Alcohol/week: 0.0 standard drinks  . Drug use: Not Currently  . Sexual activity: Not on file  Other Topics Concern  . Not on file  Social History Narrative   [employer] fan   Married 2016, 2 stepkids, 1 biological son.    Works at [employer] Strain:   . Difficulty of Paying Living Expenses: Not on file  Food Insecurity:   . Worried About Charity fundraiser in the Last Year: Not on  file  . [place] in the Last Year: Not on file  Transportation Needs:   . Lack of Transportation (Medical): Not on file  . Lack of Transportation (Non-Medical): Not on file  Physical Activity:   . Days of Exercise per Week: Not on file  . Minutes of Exercise per Session: Not on file  Stress:   . Feeling of Stress : Not on file  Social Connections:   . Frequency of Communication with Friends and Family: Not on file  . Frequency of Social Gatherings with Friends and Family: Not on file  . Attends Religious Services: Not on file  . Active Member of Clubs or Organizations: Not on file  . Attends Archivist Meetings: Not on file  . Marital Status: Not on file     Family History: The patient's family history includes Diabetes in  his father; Heart disease in his mother; Hypertension in his father; Prostate cancer in his maternal grandfather. There is no history of Colon cancer.  ROS:   Please see the history of present illness.     All other systems reviewed and are negative.  EKGs/Labs/Other Studies Reviewed:    The following studies were reviewed today:   EKG:  EKG is  ordered today.  The ekg ordered today demonstrates sinus bradycardia, right bundle branch block, left anterior hemiblock.  Recent Labs: 09/01/2018: ALT 15 09/13/2018: Pro B Natriuretic peptide (BNP) 307.0 11/06/2018: Hemoglobin 14.8; Platelets 225 04/17/2019: BUN 25; Creatinine, Ser 2.80; Potassium 3.7; Sodium 139  Recent Lipid Panel No results found for: CHOL, TRIG, HDL, CHOLHDL, VLDL, LDLCALC, LDLDIRECT  Physical Exam:    VS:  BP (!) 150/90 (BP Location: Right Arm, Patient Position: Sitting, Cuff Size: Normal)   Pulse (!) 49   Ht 5\' 7"  (1.702 m)   Wt 234 lb (106.1 kg)   SpO2 98%   BMI 36.65 kg/m     Wt Readings from Last 3 Encounters:  04/25/19 234 lb (106.1 kg)  04/21/19 233 lb 3 oz (105.8 kg)  04/17/19 232 lb 2 oz (105.3 kg)     GEN:  Well nourished, well developed in no acute distress HEENT: Normal NECK: No JVD; No carotid bruits LYMPHATICS: No lymphadenopathy CARDIAC: RRR, no murmurs, rubs, gallops RESPIRATORY:  Clear to auscultation without rales, wheezing or rhonchi  ABDOMEN: Soft, non-tender, non-distended MUSCULOSKELETAL:  No edema; No deformity  SKIN: Warm and dry NEUROLOGIC:  Alert and oriented x 3 PSYCHIATRIC:  Normal affect   ASSESSMENT:    1. Essential hypertension   2. Aortic root dilation (HCC)   3. Smoking   4. CKD (chronic kidney disease) stage 4, GFR 15-29 ml/min (HCC)    PLAN:    In order of problems listed above:  1. Blood pressures not controlled.  Increase ibesartan to 150 mg daily.  Patient is bradycardic with heart rates of 49.  Decrease Coreg to 12.5 twice daily.  Continue Norvasc 10 mg daily,  continue hydralazine 25 mg 3 times daily.  Patient will call us with repeat blood pressures in about 2 weeks time.  If blood pressure is not controlled, plan to increase ibesartan to 300 mg daily. 2. Last echocardiogram in 2019 showed mild aortic root dilatation.  Patient states having difficulty with making payments for healthcare cost.  As he does not have insurance.  He is working on Psychologist, prison and probation services.  Recommend echocardiogram for aortic root dilatation monitoring.  Patient will call the office after he has insurance so he can  have the test performed. 3. Current smoker.  Smoking cessation advised.  Over 5 minutes spent counseling patient. 4. Patient with CKD-4, current GFR 25.  Recommend he sees a nephrologist for of management and follow-up.  Total encounter time more than 61 minutes  Greater than 50% was spent in counseling and coordination of care with the patient.  Including medication management, patient not having insurance and difficulty making payments for his medical course, hypertension management.   This note was generated in part or whole with voice recognition software. Voice recognition is usually quite accurate but there are transcription errors that can and very often do occur. I apologize for any typographical errors that were not detected and corrected.  Medication Adjustments/Labs and Tests Ordered: Current medicines are reviewed at length with the patient today.  Concerns regarding medicines are outlined above.  Orders Placed This Encounter  Procedures  . EKG 12-Lead   Meds ordered this encounter  Medications  . carvedilol (COREG) 12.5 MG tablet    Sig: Take 1 tablet (12.5 mg total) by mouth 2 (two) times daily.    Dispense:  180 tablet    Refill:  1  . irbesartan (AVAPRO) 150 MG tablet    Sig: Take 1 tablet (150 mg total) by mouth daily.    Dispense:  90 tablet    Refill:  1    Patient Instructions  Medication Instructions:  Your physician has  recommended you make the following change in your medication:  1- DECREASE Carvedilol to 12.5 mg by mouth two times a day. 2- INCREASE Irbesartan to 150 mg by mouth once a day.  *If you need a refill on your cardiac medications before your next appointment, please call your pharmacy*  Lab Work: none If you have labs (blood work) drawn today and your tests are completely normal, you will receive your results only by: Marland Kitchen MyChart Message (if you have MyChart) OR . A paper copy in the mail If you have any lab test that is abnormal or we need to change your treatment, we will call you to review the results.  Testing/Procedures: None Please let us know when you are ready to schedule an echocardiogram.  Follow-Up: At [facility], you and your health needs are our priority.  As part of our continuing mission to provide you with exceptional heart care, we have created designated Provider Care Teams.  These Care Teams include your primary Cardiologist (physician) and Advanced Practice Providers (APPs -  Physician Assistants and Nurse Practitioners) who all work together to provide you with the care you need, when you need it.  Your next appointment:   3 month(s)  The format for your next appointment:   In Person  Provider:   [name], MD     Signed, [name], MD  04/25/2019 10:49 AM    [place]

## 2019-04-26 ENCOUNTER — Telehealth: Payer: Self-pay | Admitting: *Deleted

## 2019-04-26 NOTE — Telephone Encounter (Signed)
Patient left a voicemail stating that he was to report his blood pressure readings to Dr. Damita Dunnings. Patient stated his blood pressure on 04/22/19 was 184/116 and 04/23/19 193/110. Patient stated his blood pressure on Monday was 150/90, so it is getting better. Patient stated that he saw cardiology and they increased one of his medications and decreased the other one. See office notes from Cardiology.

## 2019-04-26 NOTE — Telephone Encounter (Signed)
I saw the note from cardiology.  I saw that his blood pressure was getting better.  Cardiology gave him a set of recommendations about his medications and they want him to update the cardiology clinic in about 2 weeks.  I think that makes sense.  I thank patient for the update.

## 2019-04-27 NOTE — Telephone Encounter (Signed)
Patient advised and verbalized understanding 

## 2019-05-12 ENCOUNTER — Telehealth: Payer: Self-pay

## 2019-05-12 NOTE — Telephone Encounter (Signed)
05/12/2019 Left  message on home voicemail for patient to return my call. Unable to leave message on cell number voicemail (wife's phone) did not  pick up. Will attempt to call next week.  Ambrose Mantle [phone]

## 2019-05-17 ENCOUNTER — Telehealth: Payer: Self-pay

## 2019-05-17 NOTE — Telephone Encounter (Signed)
05/17/2019 Left  message on home voicemail for patient to return my call. Unable to leave message on cell number voicemail (wife's phone) did not  pick up.  Ambrose Mantle [phone]

## 2019-05-17 NOTE — Telephone Encounter (Signed)
Noted. Thanks.

## 2019-06-04 ENCOUNTER — Emergency Department
Admission: EM | Admit: 2019-06-04 | Discharge: 2019-06-04 | Disposition: A | Discharge status: Home / Self Care | Payer: Self-pay | Attending: Emergency Medicine | Admitting: Emergency Medicine

## 2019-06-04 ENCOUNTER — Other Ambulatory Visit: Payer: Self-pay

## 2019-06-04 ENCOUNTER — Encounter: Payer: Self-pay | Admitting: Emergency Medicine

## 2019-06-04 ENCOUNTER — Emergency Department: Payer: Self-pay

## 2019-06-04 DIAGNOSIS — Y92009 Unspecified place in unspecified non-institutional (private) residence as the place of occurrence of the external cause: Secondary | ICD-10-CM

## 2019-06-04 DIAGNOSIS — Y929 Unspecified place or not applicable: Secondary | ICD-10-CM | POA: Insufficient documentation

## 2019-06-04 DIAGNOSIS — I129 Hypertensive chronic kidney disease with stage 1 through stage 4 chronic kidney disease, or unspecified chronic kidney disease: Secondary | ICD-10-CM | POA: Insufficient documentation

## 2019-06-04 DIAGNOSIS — S93601A Unspecified sprain of right foot, initial encounter: Secondary | ICD-10-CM | POA: Insufficient documentation

## 2019-06-04 DIAGNOSIS — Z79899 Other long term (current) drug therapy: Secondary | ICD-10-CM | POA: Insufficient documentation

## 2019-06-04 DIAGNOSIS — W010XXA Fall on same level from slipping, tripping and stumbling without subsequent striking against object, initial encounter: Secondary | ICD-10-CM | POA: Insufficient documentation

## 2019-06-04 DIAGNOSIS — Y939 Activity, unspecified: Secondary | ICD-10-CM | POA: Insufficient documentation

## 2019-06-04 DIAGNOSIS — F17210 Nicotine dependence, cigarettes, uncomplicated: Secondary | ICD-10-CM | POA: Insufficient documentation

## 2019-06-04 DIAGNOSIS — W19XXXA Unspecified fall, initial encounter: Secondary | ICD-10-CM

## 2019-06-04 DIAGNOSIS — Y999 Unspecified external cause status: Secondary | ICD-10-CM | POA: Insufficient documentation

## 2019-06-04 DIAGNOSIS — N189 Chronic kidney disease, unspecified: Secondary | ICD-10-CM | POA: Insufficient documentation

## 2019-06-04 DIAGNOSIS — S8001XA Contusion of right knee, initial encounter: Secondary | ICD-10-CM | POA: Insufficient documentation

## 2019-06-04 MED ORDER — HYDROCODONE-ACETAMINOPHEN 5-325 MG PO TABS: 1.0000 | ORAL_TABLET | Freq: Four times a day (QID) | ORAL | 0 refills | Status: DC | PRN

## 2019-06-04 NOTE — ED Provider Notes (Signed)
[facility] Emergency Department Provider Note  ____________________________________________   First MD Initiated Contact with Patient 06/04/19 [phone]     (approximate)  I have reviewed the triage vital signs and the nursing notes.   HISTORY  Chief Complaint Fall   HPI Jeremiah Keith is a 34 y.o. male presents to the ED with complaint of right ankle/foot pain x4 days.  Patient states that he slipped on wet concrete steps and bruised his right knee but has continued to have pain to his foot that increases with walking.  Patient has continued to be ambulatory.  He denies any previous ankle or foot injuries.  He states it is easier to walk when he has shoes on.  He denies any head injury or loss of consciousness.  Pain is rated 8 out of 10.       Past Medical History:  Diagnosis Date  . CKD (chronic kidney disease)   . Depression    after death of family members  . History of chicken pox   . Hypertension   . Substance abuse [facility])    h/o cocaine abuse    Patient Active Problem List   Diagnosis Date Noted  . CKD (chronic kidney disease) 09/04/2018  . AKI (acute kidney injury) ([place])   . Demand ischemia ([place])   . Hypertensive emergency 11/01/2017  . Acute pulmonary edema (HCC)   . Sciatica 05/05/2015  . History of pneumonia 06/13/2014  . HTN (hypertension) 06/13/2014    Past Surgical History:  Procedure Laterality Date  . TONSILLECTOMY AND ADENOIDECTOMY  1990    Prior to Admission medications   Medication Sig Start Date End Date Taking? Authorizing Provider  amLODipine (NORVASC) 10 MG tablet Take 1 tablet (10 mg total) by mouth daily. 04/17/19   [name], MD  carvedilol (COREG) 12.5 MG tablet Take 1 tablet (12.5 mg total) by mouth 2 (two) times daily. 04/25/19 07/24/19  [name], MD  cloNIDine (CATAPRES) 0.2 MG tablet Take 1 tablet (0.2 mg total) by mouth 3 (three) times daily. 04/17/19   [name], MD  hydrALAZINE  (APRESOLINE) 25 MG tablet Take 1 tablet (25 mg total) by mouth 3 (three) times daily. 04/21/19   [name], MD  HYDROcodone-acetaminophen (NORCO/VICODIN) 5-325 MG tablet Take 1 tablet by mouth every 6 (six) hours as needed for moderate pain. 06/04/19   [name], PA-C  irbesartan (AVAPRO) 150 MG tablet Take 1 tablet (150 mg total) by mouth daily. 04/25/19   [name], MD    Allergies Nsaids and Penicillins  Family History  Problem Relation Age of Onset  . Heart disease Mother   . Diabetes Father   . Hypertension Father   . Prostate cancer Maternal Grandfather        possible prostate cancer  . Colon cancer Neg Hx     Social History Social History   Tobacco Use  . Smoking status: Current Every Day Smoker    Packs/day: 25.00    Types: Cigarettes  . Smokeless tobacco: Never Used  Substance Use Topics  . Alcohol use: Yes    Alcohol/week: 0.0 standard drinks  . Drug use: Not Currently    Review of Systems Constitutional: No fever/chills Eyes: No visual changes. ENT: No trauma. Cardiovascular: Denies chest pain. Respiratory: Denies shortness of breath. Gastrointestinal: No abdominal pain.  No nausea, no vomiting.  Musculoskeletal: Positive for right knee, ankle and foot pain. Skin: Positive for bruising. Neurological: Negative for headaches, focal  weakness or numbness. ____________________________________________   PHYSICAL EXAM:  VITAL SIGNS: ED Triage Vitals [06/04/19 0911]  Enc Vitals Group     BP      Pulse      Resp      Temp      Temp src      SpO2      Weight 230 lb (104.3 kg)     Height 5\' 7"  (1.702 m)     Head Circumference      Peak Flow      Pain Score 8     Pain Loc      Pain Edu?      Excl. in [place]?    Constitutional: Alert and oriented. Well appearing and in no acute distress. Eyes: Conjunctivae are normal. PERRL. EOMI. Head: Atraumatic. Nose: No trauma. Neck: No stridor.  No tenderness on palpation of cervical spine  posteriorly.  Range of motion is without restriction. Cardiovascular: Normal rate, regular rhythm. Grossly normal heart sounds.  Good peripheral circulation. Respiratory: Normal respiratory effort.  No retractions. Lungs CTAB. Gastrointestinal: Soft and nontender.  Musculoskeletal: On examination of the right lower extremity there is some minimal ecchymosis noted to the medial aspect of the right knee.  No effusion is present.  Range of motion is slow and guarded.  No crepitus was noted.  Skin is intact.  No point tenderness is palpated along the shaft of the tibia.  No tenderness is noted with compression of the ankle and no soft tissue edema present.  There is moderate tenderness on palpation of the dorsal aspect of the right foot and soft tissue edema is present.  No ecchymosis or abrasions are seen.  Motor sensory function intact.  Capillary refills less than 3 seconds. Neurologic:  Normal speech and language. No gross focal neurologic deficits are appreciated.  Skin:  Skin is warm, dry and intact.  Psychiatric: Mood and affect are normal. Speech and behavior are normal.  ____________________________________________   LABS (all labs ordered are listed, but only abnormal results are displayed)  Labs Reviewed - No data to display  RADIOLOGY  Official radiology report(s): DG Tibia/Fibula Right  Result Date: 06/04/2019 CLINICAL DATA:  34 year old male with persistent lower leg pain after slipping and falling this past Wednesday. EXAM: RIGHT TIBIA AND FIBULA - 2 VIEW COMPARISON:  None. FINDINGS: There is no evidence of fracture or other focal bone lesions. Scattered dermal calcifications are noted incidentally. Mild soft tissue swelling over the anterior aspect of the ankle. No ankle joint effusion. IMPRESSION: Negative. Electronically Signed   By: Jacqulynn Cadet M.D.   On: 06/04/2019 10:12   DG Foot Complete Right  Result Date: 06/04/2019 CLINICAL DATA:  Pain and swelling of the foot and  ankle after slipping and falling this past Wednesday. EXAM: RIGHT FOOT COMPLETE - 3+ VIEW COMPARISON:  Concurrently obtained radiographs of the right lower leg FINDINGS: There is no evidence of fracture or dislocation. There is no evidence of arthropathy or other focal bone abnormality. Soft tissues are unremarkable. IMPRESSION: Negative. Electronically Signed   By: Jacqulynn Cadet M.D.   On: 06/04/2019 10:11    ____________________________________________   PROCEDURES  Procedure(s) performed (including Critical Care):  Procedures  Ace wrap and crutches were applied by Halford Decamp, RN. ____________________________________________   INITIAL IMPRESSION / ASSESSMENT AND PLAN / ED COURSE  As part of my medical decision making, I reviewed the following data within the electronic MEDICAL RECORD NUMBER Notes from prior ED visits and [place] Controlled  Substance Database  34 year old male presents to the ED with complaint of continued right lower extremity pain for 4 days.  Patient slipped on some wet steps at his home causing him to fall.  There was no head injury or loss of consciousness.  He has continued to be ambulatory but continues to have pain with weightbearing.  Physical exam does show ecchymosis to the medial aspect of the knee however there is marked tenderness on palpation of the dorsal aspect of the right foot with some soft tissue edema present.  X-rays were negative for both the tib-fib and foot.  Patient was made aware.  An Ace wrap was applied and patient was given crutches.  A prescription for Norco was sent to his pharmacy.  Patient's blood pressure remained elevated and he is aware.  He states he did not take his blood pressure medication today and plans to take it as soon as he gets home.  He denies any chest pain, shortness of breath, or dizziness.  ____________________________________________   FINAL CLINICAL IMPRESSION(S) / ED DIAGNOSES  Final diagnoses:  Sprain of right foot,  initial encounter  Contusion of right knee, initial encounter  Fall in home, initial encounter     ED Discharge Orders         Ordered    HYDROcodone-acetaminophen (NORCO/VICODIN) 5-325 MG tablet  Every 6 hours PRN     06/04/19 1036           Note:  This document was prepared using Dragon voice recognition software and may include unintentional dictation errors.    [name], PA-C 06/04/19 1110    Delman Kitten, MD 06/04/19 1544

## 2019-06-04 NOTE — ED Triage Notes (Signed)
Presents s/p fall on weds.  States he slipped and landed on right ankle/foot  Swelling noted to right foot/ankle  Also has some bruising to inside of right knee  States pain is to top of foot

## 2019-06-04 NOTE — Discharge Instructions (Addendum)
Follow-up with your primary care provider if any continued problems or continued concerns.  Take blood pressure medication daily.  Have your blood pressure checked in 1 week and if still elevated your doctor may want to change your blood pressure medication.  Wear Ace wrap for support and use crutches when needed until you are able to bear weight without pain.  Take Norco only as needed for pain.  Do not take this medication drive.  A note for 2 days was written to stay out of work.  If additional days are needed a note will need to come from your primary care provider.

## 2019-07-24 ENCOUNTER — Ambulatory Visit: Payer: Self-pay | Admitting: Cardiology

## 2019-07-25 ENCOUNTER — Encounter: Payer: Self-pay | Admitting: Cardiology

## 2020-01-17 ENCOUNTER — Telehealth: Payer: Self-pay | Admitting: Family Medicine

## 2020-01-17 MED ORDER — HYDRALAZINE HCL 25 MG PO TABS: 25.0000 mg | ORAL_TABLET | Freq: Three times a day (TID) | ORAL | 0 refills | Status: DC

## 2020-01-17 MED ORDER — AMLODIPINE BESYLATE 10 MG PO TABS: 10.0000 mg | ORAL_TABLET | Freq: Every day | ORAL | 0 refills | Status: DC

## 2020-01-17 MED ORDER — CLONIDINE HCL 0.2 MG PO TABS: 0.2000 mg | ORAL_TABLET | Freq: Three times a day (TID) | ORAL | 0 refills | Status: DC

## 2020-01-17 NOTE — Telephone Encounter (Signed)
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 Patient stated that he is not sure that he has contacted anyone about Medicaid or not. I asked patient if he has a telephone number to call and follow-up on that. Patient stated that he will call them back. Patient stated that he has not talked to [employer] because has been too busy. Patient stated that he will just, not sure what the rest of the words were and he hung up on me.

## 2020-01-17 NOTE — Telephone Encounter (Signed)
Medication Refill - Medication:  cloNIDine (CATAPRES) 0.2 MG tablet amLODipine (NORVASC) 10 MG tablet  hydrALAZINE (APRESOLINE) 25 MG tablet  Has the patient contacted their pharmacy? No. Pt stated he does not have funds to do any appointments and is completely out of meds. Requesting asap.   Preferred Pharmacy (with phone number or street name):  [facility] [address] [place], [place] [address] Phone: [phone]

## 2020-01-17 NOTE — Telephone Encounter (Signed)
I thank you for your effort.  Please talk to me about this when I get back to clinic.

## 2020-01-17 NOTE — Telephone Encounter (Signed)
He needs a follow-up scheduled.  Jeremiah Keith called him previously to check with him about resources.  Did he ever apply for Medicaid?  If he is having chest pain or shortness of breath then he needs to go to the ER.

## 2020-02-09 ENCOUNTER — Encounter: Payer: Self-pay | Admitting: Emergency Medicine

## 2020-02-09 ENCOUNTER — Emergency Department: Payer: Medicaid Other

## 2020-02-09 ENCOUNTER — Other Ambulatory Visit: Payer: Self-pay

## 2020-02-09 ENCOUNTER — Inpatient Hospital Stay (HOSPITAL_COMMUNITY)
Admit: 2020-02-09 | Discharge: 2020-02-09 | Disposition: A | Discharge status: Home / Self Care | Payer: Medicaid Other | Attending: Internal Medicine | Admitting: Internal Medicine

## 2020-02-09 ENCOUNTER — Inpatient Hospital Stay
Admission: EM | Admit: 2020-02-09 | Discharge: 2020-02-10 | DRG: 309 | Disposition: A | Discharge status: Home / Self Care | Payer: Medicaid Other | Attending: Family Medicine | Admitting: Family Medicine

## 2020-02-09 DIAGNOSIS — Z888 Allergy status to other drugs, medicaments and biological substances status: Secondary | ICD-10-CM | POA: Diagnosis not present

## 2020-02-09 DIAGNOSIS — T50916A Underdosing of multiple unspecified drugs, medicaments and biological substances, initial encounter: Secondary | ICD-10-CM | POA: Diagnosis present

## 2020-02-09 DIAGNOSIS — F1290 Cannabis use, unspecified, uncomplicated: Secondary | ICD-10-CM | POA: Diagnosis present

## 2020-02-09 DIAGNOSIS — Z20822 Contact with and (suspected) exposure to covid-19: Secondary | ICD-10-CM | POA: Diagnosis present

## 2020-02-09 DIAGNOSIS — Z79899 Other long term (current) drug therapy: Secondary | ICD-10-CM | POA: Diagnosis not present

## 2020-02-09 DIAGNOSIS — Z91120 Patient's intentional underdosing of medication regimen due to financial hardship: Secondary | ICD-10-CM | POA: Diagnosis not present

## 2020-02-09 DIAGNOSIS — I248 Other forms of acute ischemic heart disease: Secondary | ICD-10-CM | POA: Diagnosis present

## 2020-02-09 DIAGNOSIS — Z8249 Family history of ischemic heart disease and other diseases of the circulatory system: Secondary | ICD-10-CM

## 2020-02-09 DIAGNOSIS — I129 Hypertensive chronic kidney disease with stage 1 through stage 4 chronic kidney disease, or unspecified chronic kidney disease: Secondary | ICD-10-CM | POA: Diagnosis present

## 2020-02-09 DIAGNOSIS — Z9114 Patient's other noncompliance with medication regimen: Secondary | ICD-10-CM

## 2020-02-09 DIAGNOSIS — Y929 Unspecified place or not applicable: Secondary | ICD-10-CM

## 2020-02-09 DIAGNOSIS — Z833 Family history of diabetes mellitus: Secondary | ICD-10-CM | POA: Diagnosis not present

## 2020-02-09 DIAGNOSIS — R002 Palpitations: Secondary | ICD-10-CM | POA: Diagnosis present

## 2020-02-09 DIAGNOSIS — Z880 Allergy status to penicillin: Secondary | ICD-10-CM

## 2020-02-09 DIAGNOSIS — Z87891 Personal history of nicotine dependence: Secondary | ICD-10-CM | POA: Diagnosis not present

## 2020-02-09 DIAGNOSIS — I4819 Other persistent atrial fibrillation: Principal | ICD-10-CM | POA: Diagnosis present

## 2020-02-09 DIAGNOSIS — N189 Chronic kidney disease, unspecified: Secondary | ICD-10-CM | POA: Diagnosis present

## 2020-02-09 DIAGNOSIS — I4891 Unspecified atrial fibrillation: Secondary | ICD-10-CM

## 2020-02-09 DIAGNOSIS — I77810 Thoracic aortic ectasia: Secondary | ICD-10-CM | POA: Diagnosis present

## 2020-02-09 DIAGNOSIS — E876 Hypokalemia: Secondary | ICD-10-CM | POA: Diagnosis present

## 2020-02-09 DIAGNOSIS — N1832 Chronic kidney disease, stage 3b: Secondary | ICD-10-CM | POA: Diagnosis present

## 2020-02-09 DIAGNOSIS — Z8042 Family history of malignant neoplasm of prostate: Secondary | ICD-10-CM

## 2020-02-09 DIAGNOSIS — I10 Essential (primary) hypertension: Secondary | ICD-10-CM | POA: Diagnosis present

## 2020-02-09 DIAGNOSIS — Z91148 Patient's other noncompliance with medication regimen for other reason: Secondary | ICD-10-CM

## 2020-02-09 LAB — COMPREHENSIVE METABOLIC PANEL
ALT: 17 U/L (ref 0–44)
AST: 16 U/L (ref 15–41)
Albumin: 4.4 g/dL (ref 3.5–5.0)
Alkaline Phosphatase: 61 U/L (ref 38–126)
Anion gap: 11 (ref 5–15)
BUN: 32 mg/dL — ABNORMAL HIGH (ref 6–20)
CO2: 24 mmol/L (ref 22–32)
Calcium: 9.8 mg/dL (ref 8.9–10.3)
Chloride: 103 mmol/L (ref 98–111)
Creatinine, Ser: 2.64 mg/dL — ABNORMAL HIGH (ref 0.61–1.24)
GFR, Estimated: 31 mL/min — ABNORMAL LOW (ref >60)
Glucose, Bld: 145 mg/dL — ABNORMAL HIGH (ref 70–99)
Potassium: 3.6 mmol/L (ref 3.5–5.1)
Sodium: 138 mmol/L (ref 135–145)
Total Bilirubin: 1.4 mg/dL — ABNORMAL HIGH (ref 0.3–1.2)
Total Protein: 7.5 g/dL (ref 6.5–8.1)

## 2020-02-09 LAB — CBC WITH DIFFERENTIAL/PLATELET
Abs Immature Granulocytes: 0.11 10*3/uL — ABNORMAL HIGH (ref 0.00–0.07)
Basophils Absolute: 0.1 10*3/uL (ref 0.0–0.1)
Basophils Relative: 0 %
Eosinophils Absolute: 0.1 10*3/uL (ref 0.0–0.5)
Eosinophils Relative: 1 %
HCT: 51.9 % (ref 39.0–52.0)
Hemoglobin: 18.2 g/dL — ABNORMAL HIGH (ref 13.0–17.0)
Immature Granulocytes: 1 %
Lymphocytes Relative: 17 %
Lymphs Abs: 2.8 10*3/uL (ref 0.7–4.0)
MCH: 30.6 pg (ref 26.0–34.0)
MCHC: 35.1 g/dL (ref 30.0–36.0)
MCV: 87.4 fL (ref 80.0–100.0)
Monocytes Absolute: 0.9 10*3/uL (ref 0.1–1.0)
Monocytes Relative: 6 %
Neutro Abs: 12 10*3/uL — ABNORMAL HIGH (ref 1.7–7.7)
Neutrophils Relative %: 75 %
Platelets: 260 10*3/uL (ref 150–400)
RBC: 5.94 MIL/uL — ABNORMAL HIGH (ref 4.22–5.81)
RDW: 12.8 % (ref 11.5–15.5)
WBC: 15.9 10*3/uL — ABNORMAL HIGH (ref 4.0–10.5)
nRBC: 0 % (ref 0.0–0.2)

## 2020-02-09 LAB — TSH: TSH: 2.87 u[IU]/mL (ref 0.350–4.500)

## 2020-02-09 LAB — LIPASE, BLOOD: Lipase: 35 U/L (ref 11–51)

## 2020-02-09 LAB — ECHOCARDIOGRAM COMPLETE
AR max vel: 2.95 cm2
AV Area VTI: 3.2 cm2
AV Area mean vel: 2.96 cm2
AV Mean grad: 2.5 mmHg
AV Peak grad: 4.9 mmHg
Ao pk vel: 1.1 m/s
Area-P 1/2: 4.6 cm2
Height: 67 in
S' Lateral: 3.39 cm
Weight: 3463.87 oz

## 2020-02-09 LAB — RESP PANEL BY RT-PCR (FLU A&B, COVID) ARPGX2
Influenza A by PCR: NEGATIVE
Influenza B by PCR: NEGATIVE
SARS Coronavirus 2 by RT PCR: NEGATIVE

## 2020-02-09 LAB — TROPONIN I (HIGH SENSITIVITY)
Troponin I (High Sensitivity): 39 ng/L — ABNORMAL HIGH (ref <18)
Troponin I (High Sensitivity): 35 ng/L — ABNORMAL HIGH (ref <18)
Troponin I (High Sensitivity): 33 ng/L — ABNORMAL HIGH (ref <18)
Troponin I (High Sensitivity): 35 ng/L — ABNORMAL HIGH (ref <18)

## 2020-02-09 LAB — PROTIME-INR
INR: 1 (ref 0.8–1.2)
Prothrombin Time: 12.8 seconds (ref 11.4–15.2)

## 2020-02-09 LAB — APTT: aPTT: 35 seconds (ref 24–36)

## 2020-02-09 LAB — HIV ANTIBODY (ROUTINE TESTING W REFLEX): HIV Screen 4th Generation wRfx: NONREACTIVE

## 2020-02-09 LAB — MAGNESIUM: Magnesium: 2.3 mg/dL (ref 1.7–2.4)

## 2020-02-09 LAB — LACTIC ACID, PLASMA: Lactic Acid, Venous: 1 mmol/L (ref 0.5–1.9)

## 2020-02-09 MED ORDER — DILTIAZEM HCL-DEXTROSE 125-5 MG/125ML-% IV SOLN (PREMIX)
5.0000 mg/h | INTRAVENOUS | Status: DC
  Filled 2020-02-09: qty 125

## 2020-02-09 MED ORDER — SODIUM CHLORIDE 0.9 % IV SOLN: 250.0000 mL | INTRAVENOUS | Status: DC | PRN

## 2020-02-09 MED ORDER — APIXABAN 5 MG PO TABS
5.0000 mg | ORAL_TABLET | Freq: Two times a day (BID) | ORAL | Status: DC
  Administered 2020-02-09 – 2020-02-10 (×3): 5 mg via ORAL
  Filled 2020-02-09 (×3): qty 1

## 2020-02-09 MED ORDER — SODIUM CHLORIDE 0.9% FLUSH
3.0000 mL | Freq: Two times a day (BID) | INTRAVENOUS | Status: DC
  Administered 2020-02-09 – 2020-02-10 (×2): 3 mL via INTRAVENOUS

## 2020-02-09 MED ORDER — ACETAMINOPHEN 325 MG PO TABS: 650.0000 mg | ORAL_TABLET | ORAL | Status: DC | PRN

## 2020-02-09 MED ORDER — SODIUM CHLORIDE 0.9 % IV BOLUS
1000.0000 mL | Freq: Once | INTRAVENOUS | Status: AC
  Administered 2020-02-09: 1000 mL via INTRAVENOUS

## 2020-02-09 MED ORDER — ONDANSETRON HCL 4 MG/2ML IJ SOLN: 4.0000 mg | Freq: Four times a day (QID) | INTRAMUSCULAR | Status: DC | PRN

## 2020-02-09 MED ORDER — DILTIAZEM HCL 25 MG/5ML IV SOLN
20.0000 mg | Freq: Once | INTRAVENOUS | Status: AC
  Administered 2020-02-09: 20 mg via INTRAVENOUS
  Filled 2020-02-09: qty 5

## 2020-02-09 MED ORDER — AMLODIPINE BESYLATE 5 MG PO TABS: 10.0000 mg | ORAL_TABLET | Freq: Every day | ORAL | Status: DC

## 2020-02-09 MED ORDER — ASPIRIN EC 81 MG PO TBEC: 81.0000 mg | DELAYED_RELEASE_TABLET | Freq: Every day | ORAL | Status: DC

## 2020-02-09 MED ORDER — ENOXAPARIN SODIUM 40 MG/0.4ML ~~LOC~~ SOLN: 40.0000 mg | SUBCUTANEOUS | Status: DC

## 2020-02-09 MED ORDER — CARVEDILOL 6.25 MG PO TABS: 12.5000 mg | ORAL_TABLET | Freq: Two times a day (BID) | ORAL | Status: DC

## 2020-02-09 MED ORDER — DILTIAZEM HCL 30 MG PO TABS
60.0000 mg | ORAL_TABLET | Freq: Three times a day (TID) | ORAL | Status: DC
  Administered 2020-02-09 (×2): 60 mg via ORAL
  Filled 2020-02-09 (×2): qty 2

## 2020-02-09 MED ORDER — IRBESARTAN 150 MG PO TABS
150.0000 mg | ORAL_TABLET | Freq: Every day | ORAL | Status: DC
  Administered 2020-02-09 – 2020-02-10 (×2): 150 mg via ORAL
  Filled 2020-02-09 (×3): qty 1

## 2020-02-09 MED ORDER — SODIUM CHLORIDE 0.9% FLUSH: 3.0000 mL | INTRAVENOUS | Status: DC | PRN

## 2020-02-09 NOTE — H&P (Addendum)
History and Physical    Jeremiah Keith TWS:[id number] DOB: 24-Mar-1985 DOA: 02/09/2020  PCP: [name], MD   Patient coming from: Home  I have personally briefly reviewed patient's old medical records in [place]  Chief Complaint: Palpitations  HPI: Jeremiah Keith is a 34 y.o. male with medical history significant for hypertension, medication noncompliance and stage III chronic kidney disease who presents to the emergency room for evaluation of palpitations that he has had for 2 days.  Patient states that he has a known history of hypertension but has been out of his meds for over a week due to financial reasons.  Over the last 2 days he developed palpitations which have been intermittent and associated with shortness of breath.  He decided to seek medical attention due to the persistence of his symptoms.  Upon arrival to the ER he was noted to be anxious and diaphoretic but denied having any chest pain, no nausea, no vomiting, no lower extremity swelling, no fever, no chills, no cough, no dizziness or lightheadedness, no abdominal pain or any changes in his bowel habits. He denies any illicit drug use and states that he uses occasional marijuana. Labs show sodium 138, potassium 3.6, chloride 103, bicarb 24, glucose 145, BUN 32, creatinine 2.64, calcium 9.8, magnesium 2.3, alkaline phosphatase 61, albumin 4.4, AST 16, ALT 17, total protein 7.5, troponin 39, lactic acid 1.0, white count 15.9, hemoglobin 18.2, hematocrit 51.9, MCV 87.4, RDW 12.8, platelet count 2 6, PT 12.8, INR 1.0 Respiratory viral panel was pending at the time of this H&P Chest x-ray reviewed by me shows no active cardiopulmonary disease Twelve-lead EKG reviewed by me shows A. fib with a rapid ventricular rate, right bundle branch block.   ED Course: Patient is a 34 year old Caucasian male with history of hypertension and stage III chronic kidney disease who has been noncompliant with prescribed  meds and presents to the ER for evaluation of palpitations.  Twelve-lead EKG shows new onset A. fib with rapid ventricular rate.  Patient received 20 mg IV of diltiazem with improvement in his heart rate.  He will be admitted to the hospital for further evaluation.  Review of Systems: As per HPI otherwise 10 point review of systems negative.    Past Medical History:  Diagnosis Date  . CKD (chronic kidney disease)   . Depression    after death of family members  . History of chicken pox   . Hypertension   . Substance abuse ([place])    h/o cocaine abuse    Past Surgical History:  Procedure Laterality Date  . TONSILLECTOMY AND ADENOIDECTOMY  1990     reports that he has been smoking cigarettes. He has been smoking about 25.00 packs per day. He has never used smokeless tobacco. He reports current alcohol use. He reports previous drug use.  Allergies  Allergen Reactions  . Nsaids     Chronic kidney disease  . Penicillins Rash    Has patient had a PCN reaction causing immediate rash, facial/tongue/throat swelling, SOB or lightheadedness with hypotension: Yes Has patient had a PCN reaction causing severe rash involving mucus membranes or skin necrosis: No Has patient had a PCN reaction that required hospitalization: No Has patient had a PCN reaction occurring within the last 10 years: No If all of the above answers are "NO", then may proceed with Cephalosporin use.     Family History  Problem Relation Age of Onset  . Heart disease Mother   .  Diabetes Father   . Hypertension Father   . Prostate cancer Maternal Grandfather        possible prostate cancer  . Colon cancer Neg Hx      Prior to Admission medications   Medication Sig Start Date End Date Taking? Authorizing Provider  amLODipine (NORVASC) 10 MG tablet Take 1 tablet (10 mg total) by mouth daily. 01/17/20   [name], MD  carvedilol (COREG) 12.5 MG tablet Take 1 tablet (12.5 mg total) by mouth 2 (two) times daily.  04/25/19 07/24/19  [name], MD  cloNIDine (CATAPRES) 0.2 MG tablet Take 1 tablet (0.2 mg total) by mouth 3 (three) times daily. 01/17/20   [name], MD  hydrALAZINE (APRESOLINE) 25 MG tablet Take 1 tablet (25 mg total) by mouth 3 (three) times daily. 01/17/20   [name], MD  HYDROcodone-acetaminophen (NORCO/VICODIN) 5-325 MG tablet Take 1 tablet by mouth every 6 (six) hours as needed for moderate pain. 06/04/19   [name], PA-C  irbesartan (AVAPRO) 150 MG tablet Take 1 tablet (150 mg total) by mouth daily. 04/25/19   [name], MD    Physical Exam: Vitals:   02/09/20 0750 02/09/20 0751 02/09/20 0800 02/09/20 0830  BP:   115/79 130/89  Pulse: 65 100 77 83  Resp: (!) 21 13 (!) 21 (!) 25  Temp:      TempSrc:      SpO2: 97% 99% 99% 99%  Weight:      Height:         Vitals:   02/09/20 0750 02/09/20 0751 02/09/20 0800 02/09/20 0830  BP:   115/79 130/89  Pulse: 65 100 77 83  Resp: (!) 21 13 (!) 21 (!) 25  Temp:      TempSrc:      SpO2: 97% 99% 99% 99%  Weight:      Height:        Constitutional: NAD, alert and oriented x 3 Eyes: PERRL, lids and conjunctivae normal ENMT: Mucous membranes are moist.  Neck: normal, supple, no masses, no thyromegaly Respiratory: clear to auscultation bilaterally, no wheezing, no crackles. Normal respiratory effort. No accessory muscle use.  Cardiovascular: Irregularly irregular, no murmurs / rubs / gallops. No extremity edema. 2+ pedal pulses. No carotid bruits.  Abdomen: no tenderness, no masses palpated. No hepatosplenomegaly. Bowel sounds positive.  Musculoskeletal: no clubbing / cyanosis. No joint deformity upper and lower extremities.  Skin: no rashes, lesions, ulcers.  Neurologic: No gross focal neurologic deficit. Psychiatric: Normal mood and affect.   Labs on Admission: I have personally reviewed following labs and imaging studies  CBC: Recent Labs  Lab 02/09/20 0641  WBC 15.9*  NEUTROABS 12.0*    HGB 18.2*  HCT 51.9  MCV 87.4  PLT 185   Basic Metabolic Panel: Recent Labs  Lab 02/09/20 0641  NA 138  K 3.6  CL 103  CO2 24  GLUCOSE 145*  BUN 32*  CREATININE 2.64*  CALCIUM 9.8  MG 2.3   GFR: Estimated Creatinine Clearance: 45.1 mL/min (A) (by C-G formula based on SCr of 2.64 mg/dL (H)). Liver Function Tests: Recent Labs  Lab 02/09/20 0641  AST 16  ALT 17  ALKPHOS 61  BILITOT 1.4*  PROT 7.5  ALBUMIN 4.4   Recent Labs  Lab 02/09/20 0641  LIPASE 35   No results for input(s): AMMONIA in the last 168 hours. Coagulation Profile: Recent Labs  Lab 02/09/20 0641  INR 1.0   Cardiac Enzymes: No  results for input(s): CKTOTAL, CKMB, CKMBINDEX, TROPONINI in the last 168 hours. BNP (last 3 results) No results for input(s): PROBNP in the last 8760 hours. HbA1C: No results for input(s): HGBA1C in the last 72 hours. CBG: No results for input(s): GLUCAP in the last 168 hours. Lipid Profile: No results for input(s): CHOL, HDL, LDLCALC, TRIG, CHOLHDL, LDLDIRECT in the last 72 hours. Thyroid Function Tests: No results for input(s): TSH, T4TOTAL, FREET4, T3FREE, THYROIDAB in the last 72 hours. Anemia Panel: No results for input(s): VITAMINB12, FOLATE, FERRITIN, TIBC, IRON, RETICCTPCT in the last 72 hours. Urine analysis:    Component Value Date/Time   COLORURINE YELLOW (A) 11/06/2018 1926   APPEARANCEUR CLEAR (A) 11/06/2018 1926   APPEARANCEUR Clear 10/08/2012 2300   LABSPEC 1.014 11/06/2018 1926   LABSPEC 1.011 10/08/2012 2300   PHURINE 5.0 11/06/2018 1926   GLUCOSEU NEGATIVE 11/06/2018 1926   GLUCOSEU Negative 10/08/2012 2300   HGBUR NEGATIVE 11/06/2018 1926   BILIRUBINUR NEGATIVE 11/06/2018 1926   BILIRUBINUR Negative 10/08/2012 Alpha 11/06/2018 1926   PROTEINUR 30 (A) 11/06/2018 1926   NITRITE NEGATIVE 11/06/2018 1926   LEUKOCYTESUR NEGATIVE 11/06/2018 1926   LEUKOCYTESUR Negative 10/08/2012 2300    Radiological Exams on  Admission: DG Chest 2 View  Result Date: 02/09/2020 CLINICAL DATA:  History of hypertension, new onset AFib and palpitations. EXAM: CHEST - 2 VIEW COMPARISON:  11/08/2017. FINDINGS: Borderline enlargement the cardiac silhouette. Both lungs are clear. No visible pleural effusions or pneumothorax. The visualized skeletal structures are unremarkable. IMPRESSION: No active cardiopulmonary disease. Electronically Signed   By: Margaretha Sheffield MD   On: 02/09/2020 07:56    EKG: Independently reviewed.  Atrial fibrillation with rapid ventricular rate  Assessment/Plan Principal Problem:   New onset atrial fibrillation (HCC) Active Problems:   HTN (hypertension)   CKD (chronic kidney disease)   Non compliance w medication regimen     New onset atrial fibrillation Patient presents for evaluation of palpitations and shortness of breath and is found to have new onset atrial fibrillation with rapid ventricular rate most likely related to hypertensive heart disease He received IV Cardizem in the ER with improvement in his heart rate Continue carvedilol 12.5 mg p.o. twice daily Obtain TSH Obtain 2D echocardiogram to rule out valvular pathology We will request cardiology consult Patient has a CHADS2VASC score of 1 and is a low to moderate risk for acute CVA.  Will defer further conversation regarding anticoagulation to cardiology    Hypertension with stage IIIb chronic kidney disease Continue carvedilol and losartan Patient will need referral to nephrology as an outpatient    Medication noncompliance Related to financial issues per patient Patient states that he has applied and now has Medicaid and will be able to keep his scheduled follow-up appointments as well as fill his prescriptions as recommended The need to be compliant with prescribed meds has been discussed with patient in detail He verbalizes understanding and agrees with the plan       DVT prophylaxis: Lovenox Code  Status: Full code Family Communication: Greater than 50% of time was spent discussing patient's condition and plan of care with him and his mother at the bedside.  All questions and concerns have been addressed.  He verbalizes understanding and agrees with the plan. Disposition Plan: Back to previous home environment Consults called: Cardiology    Collier Bullock MD [facility]     02/09/2020, 9:47 AM

## 2020-02-09 NOTE — ED Notes (Signed)
Pt transported to xray 

## 2020-02-09 NOTE — ED Provider Notes (Signed)
[facility] Emergency Department Provider Note ____________________________________________   First MD Initiated Contact with Patient 02/09/20 0654     (approximate)  I have reviewed the triage vital signs and the nursing notes.  HISTORY  Chief Complaint Palpitations   HPI Jeremiah Keith is a 34 y.o. malewho presents to the ED for evaluation of palpitations.  Chart review indicates history of previous cocaine abuse, HTN on multiple agents and CKD. 2019 TTE with normal EF, moderate LVH and moderate aortic root dilation.  Establish with [facility] cardiology in 04/2019, Dr. Garen Lah.  Patient reports heart palpitations for the past 2 days it has been mostly consistent, worsening with ambulation.  Some associated shortness of breath and dyspnea on exertion without cough, productive cough, chest pain or pressure, syncope, headache or trauma.  Reports tolerating p.o. intake at his baseline and toileting at his baseline.  Reports he recently got approved for Medicaid, because he has had significant difficulty affording his medications in the past has been using them intermittently due to this.   Past Medical History:  Diagnosis Date  . CKD (chronic kidney disease)   . Depression    after death of family members  . History of chicken pox   . Hypertension   . Substance abuse [facility])    h/o cocaine abuse    Patient Active Problem List   Diagnosis Date Noted  . CKD (chronic kidney disease) 09/04/2018  . AKI (acute kidney injury) ([place])   . Demand ischemia ([place])   . Hypertensive emergency 11/01/2017  . Acute pulmonary edema (HCC)   . Sciatica 05/05/2015  . History of pneumonia 06/13/2014  . HTN (hypertension) 06/13/2014    Past Surgical History:  Procedure Laterality Date  . TONSILLECTOMY AND ADENOIDECTOMY  1990    Prior to Admission medications   Medication Sig Start Date End Date Taking? Authorizing Provider  amLODipine (NORVASC) 10 MG tablet  Take 1 tablet (10 mg total) by mouth daily. 01/17/20   [name], MD  carvedilol (COREG) 12.5 MG tablet Take 1 tablet (12.5 mg total) by mouth 2 (two) times daily. 04/25/19 07/24/19  [name], MD  cloNIDine (CATAPRES) 0.2 MG tablet Take 1 tablet (0.2 mg total) by mouth 3 (three) times daily. 01/17/20   [name], MD  hydrALAZINE (APRESOLINE) 25 MG tablet Take 1 tablet (25 mg total) by mouth 3 (three) times daily. 01/17/20   [name], MD  HYDROcodone-acetaminophen (NORCO/VICODIN) 5-325 MG tablet Take 1 tablet by mouth every 6 (six) hours as needed for moderate pain. 06/04/19   [name], PA-C  irbesartan (AVAPRO) 150 MG tablet Take 1 tablet (150 mg total) by mouth daily. 04/25/19   [name], MD    Allergies Nsaids and Penicillins  Family History  Problem Relation Age of Onset  . Heart disease Mother   . Diabetes Father   . Hypertension Father   . Prostate cancer Maternal Grandfather        possible prostate cancer  . Colon cancer Neg Hx     Social History Social History   Tobacco Use  . Smoking status: Current Every Day Smoker    Packs/day: 25.00    Types: Cigarettes  . Smokeless tobacco: Never Used  Substance Use Topics  . Alcohol use: Yes    Alcohol/week: 0.0 standard drinks  . Drug use: Not Currently    Review of Systems  Constitutional: No fever/chills Eyes: No visual changes. ENT: No sore throat. Cardiovascular: Denies chest  pain.  Positive heart palpitations. Respiratory:  Positive for shortness of breath and dyspnea on exertion Gastrointestinal: No abdominal pain.  No nausea, no vomiting.  No diarrhea.  No constipation. Genitourinary: Negative for dysuria. Musculoskeletal: Negative for back pain. Skin: Negative for rash. Neurological: Negative for headaches, focal weakness or numbness.  ____________________________________________   PHYSICAL EXAM:  VITAL SIGNS: Vitals:   02/09/20 0751 02/09/20 0800  BP:  115/79    Pulse: 100 77  Resp: 13 (!) 21  Temp:    SpO2: 99% 99%      Constitutional: Alert and oriented. Well appearing and in no acute distress.  Pleasant and conversational in full sentences. Eyes: Conjunctivae are normal. PERRL. EOMI. Head: Atraumatic. Nose: No congestion/rhinnorhea. Mouth/Throat: Mucous membranes are moist.  Oropharynx non-erythematous. Neck: No stridor. No cervical spine tenderness to palpation. Cardiovascular: Tachycardic and irregular. Grossly normal heart sounds.  Good peripheral circulation. Respiratory: Normal respiratory effort.  No retractions. Lungs CTAB. Gastrointestinal: Soft , nondistended, nontender to palpation. No CVA tenderness. Musculoskeletal: No lower extremity tenderness nor edema.  No joint effusions. No signs of acute trauma. Neurologic:  Normal speech and language. No gross focal neurologic deficits are appreciated. No gait instability noted. Skin:  Skin is warm, dry and intact. No rash noted. Psychiatric: Mood and affect are normal. Speech and behavior are normal.  ____________________________________________   LABS (all labs ordered are listed, but only abnormal results are displayed)  Labs Reviewed  COMPREHENSIVE METABOLIC PANEL - Abnormal; Notable for the following components:      Result Value   Glucose, Bld 145 (*)    BUN 32 (*)    Creatinine, Ser 2.64 (*)    Total Bilirubin 1.4 (*)    GFR, Estimated 31 (*)    All other components within normal limits  CBC WITH DIFFERENTIAL/PLATELET - Abnormal; Notable for the following components:   WBC 15.9 (*)    RBC 5.94 (*)    Hemoglobin 18.2 (*)    Neutro Abs 12.0 (*)    Abs Immature Granulocytes 0.11 (*)    All other components within normal limits  TROPONIN I (HIGH SENSITIVITY) - Abnormal; Notable for the following components:   Troponin I (High Sensitivity) 39 (*)    All other components within normal limits  RESP PANEL BY RT-PCR (FLU A&B, COVID) ARPGX2  LIPASE, BLOOD  LACTIC ACID,  PLASMA  PROTIME-INR  APTT  MAGNESIUM  LACTIC ACID, PLASMA  TROPONIN I (HIGH SENSITIVITY)   ____________________________________________  12 Lead EKG  A. fib, rate of 145 bpm.  Normal axis.  Right bundle branch block.  No evidence of acute ischemia. ____________________________________________  RADIOLOGY  ED MD interpretation: 2 view CXR reviewed by me and significant for no acute cardiopulmonary pathology.  Official radiology report(s): DG Chest 2 View  Result Date: 02/09/2020 CLINICAL DATA:  History of hypertension, new onset AFib and palpitations. EXAM: CHEST - 2 VIEW COMPARISON:  11/08/2017. FINDINGS: Borderline enlargement the cardiac silhouette. Both lungs are clear. No visible pleural effusions or pneumothorax. The visualized skeletal structures are unremarkable. IMPRESSION: No active cardiopulmonary disease. Electronically Signed   By: Margaretha Sheffield MD   On: 02/09/2020 07:56    ____________________________________________   PROCEDURES and INTERVENTIONS  Procedure(s) performed (including Critical Care):  .1-3 Lead EKG Interpretation Performed by: Vladimir Crofts, MD Authorized by: Vladimir Crofts, MD     Interpretation: abnormal     ECG rate:  124   ECG rate assessment: tachycardic     Rhythm: atrial fibrillation  Ectopy: none     Conduction: normal   .Critical Care Performed by: Vladimir Crofts, MD Authorized by: Vladimir Crofts, MD   Critical care provider statement:    Critical care time (minutes):  45   Critical care was necessary to treat or prevent imminent or life-threatening deterioration of the following conditions:  Cardiac failure and circulatory failure   Critical care was time spent personally by me on the following activities:  Discussions with consultants, evaluation of patient's response to treatment, examination of patient, ordering and performing treatments and interventions, ordering and review of laboratory studies, ordering and review of  radiographic studies, pulse oximetry, re-evaluation of patient's condition, obtaining history from patient or surrogate and review of old charts    Medications  diltiazem (CARDIZEM) 125 mg in dextrose 5% 125 mL (1 mg/mL) infusion (has no administration in time range)  sodium chloride 0.9 % bolus 1,000 mL (1,000 mLs Intravenous New Bag/Given 02/09/20 0729)  diltiazem (CARDIZEM) injection 20 mg (20 mg Intravenous Given 02/09/20 0729)    ____________________________________________   MDM / ED COURSE   34 year old male with history of poorly controlled hypertension presents with new onset A. fib requiring diltiazem drip and medical admission.  Heart rates in the 140s on presentation in A. fib, hypertensive without hypoxia.  Exam is reassuring without evidence of distress, neurovascular deficits or trauma.  Blood work shows CKD at baseline without electrolyte derangements.  Marginal elevation of troponin likely demand in the setting of his tachycardic rates.  Rates improved after diltiazem bolus, and subsequent increasing in rates necessitating diltiazem drip.  I see no evidence of underlying pathology that set off his A. fib beyond his poorly controlled hypertension in the setting of medication noncompliance causing cardiac remodeling.  We will admit to hospitalist medicine for further work-up and management.   Clinical Course as of Feb 08 821  [facility] Feb 09, 2020  0754 Reassessed.  Heart rate in the 60s after diltiazem.   [DS]  3846 Remains irregular on the monitor consistent with A. fib.   [DS]  0818 Heart rate has been steadily increasing over the past half hour, now in the 90s.  Waxing and waning from 80s-100s.  Diltiazem drip ordered   [DS]    Clinical Course User Index [DS] Vladimir Crofts, MD    ____________________________________________   FINAL CLINICAL IMPRESSION(S) / ED DIAGNOSES  Final diagnoses:  Palpitations  Atrial fibrillation with rapid ventricular response ([place])  New  onset a-fib ([place])  Stage 3b chronic kidney disease [facility])     ED Discharge Orders    None       Keontay Vora   Note:  This document was prepared using Dragon voice recognition software and may include unintentional dictation errors.   Vladimir Crofts, MD 02/09/20 [phone]

## 2020-02-09 NOTE — ED Triage Notes (Signed)
Pt to ED from home c/o palpitations that have been going on for a couple days not resolving.  Pt presents anxious, diaphoretic, denies CP or SOB.

## 2020-02-09 NOTE — Plan of Care (Signed)

## 2020-02-09 NOTE — Progress Notes (Signed)
*  PRELIMINARY RESULTS* Echocardiogram 2D Echocardiogram has been performed.  Jeremiah Keith 02/09/2020, 2:05 PM

## 2020-02-09 NOTE — Consult Note (Signed)
Cardiology Consultation:   Patient ID: Jeremiah Keith MRN: [id number]; DOB: 1986/02/27  Admit date: 02/09/2020 Date of Consult: 02/09/2020  Primary Care Provider: [name], MD [facility] Cardiologist: [name], MD  [facility] Electrophysiologist:  None    Patient Profile:   Jeremiah Keith is a 34 y.o. male with a hx of hypertension, CKD who is being seen today for the evaluation of atrial fibrillation at the request of Dr. Francine Graven  History of Present Illness:   Jeremiah Keith is a 35 year old gentleman, former smoker, hypertension, CKD who presents due to 2-day history of palpitations and shortness of breath.  Patient has not taken his home blood pressure medications for the past 2 weeks due to lack of insurance and cost issues.  He states just getting approved for insurance.  He quit smoking 2 weeks ago.  He was at home, when he suddenly noticed fast heart rates yesterday.  He has never had this in the past.  Symptoms persisted till today which prompted him to come to the emergency room.  States smoking pot due to stress.  In the ED, EKG showed atrial fibrillation with heart rate 145.  He was given IV diltiazem 20 mg with good response in heart rate to 80s beats per minute.  He states his symptoms better after receiving IV diltiazem.   Past Medical History:  Diagnosis Date  . CKD (chronic kidney disease)   . Depression    after death of family members  . History of chicken pox   . Hypertension   . Substance abuse ([place])    h/o cocaine abuse    Past Surgical History:  Procedure Laterality Date  . TONSILLECTOMY AND ADENOIDECTOMY  1990     Home Medications:  Prior to Admission medications   Medication Sig Start Date End Date Taking? Authorizing Provider  amLODipine (NORVASC) 10 MG tablet Take 1 tablet (10 mg total) by mouth daily. 01/17/20   [name], MD  carvedilol (COREG) 12.5 MG tablet Take 1 tablet (12.5 mg total) by mouth 2  (two) times daily. 04/25/19 07/24/19  [name], MD  cloNIDine (CATAPRES) 0.2 MG tablet Take 1 tablet (0.2 mg total) by mouth 3 (three) times daily. 01/17/20   [name], MD  hydrALAZINE (APRESOLINE) 25 MG tablet Take 1 tablet (25 mg total) by mouth 3 (three) times daily. 01/17/20   [name], MD  HYDROcodone-acetaminophen (NORCO/VICODIN) 5-325 MG tablet Take 1 tablet by mouth every 6 (six) hours as needed for moderate pain. 06/04/19   [name], PA-C  irbesartan (AVAPRO) 150 MG tablet Take 1 tablet (150 mg total) by mouth daily. 04/25/19   [name], MD    Inpatient Medications: Scheduled Meds: . apixaban  5 mg Oral BID  . diltiazem  60 mg Oral TID AC  . irbesartan  150 mg Oral Daily  . sodium chloride flush  3 mL Intravenous Q12H   Continuous Infusions: . sodium chloride    . diltiazem (CARDIZEM) infusion Stopped (02/09/20 0837)   PRN Meds: sodium chloride, acetaminophen, ondansetron (ZOFRAN) IV, sodium chloride flush  Allergies:    Allergies  Allergen Reactions  . Nsaids     Chronic kidney disease  . Penicillins Rash    Has patient had a PCN reaction causing immediate rash, facial/tongue/throat swelling, SOB or lightheadedness with hypotension: Yes Has patient had a PCN reaction causing severe rash involving mucus membranes or skin necrosis: No Has patient had a PCN reaction that required  hospitalization: No Has patient had a PCN reaction occurring within the last 10 years: No If all of the above answers are "NO", then may proceed with Cephalosporin use.     Social History:   Social History   Socioeconomic History  . Marital status: Married    Spouse name: Not on file  . Number of children: Not on file  . Years of education: 11  . Highest education level: Not on file  Occupational History  . Not on file  Tobacco Use  . Smoking status: Current Every Day Smoker    Packs/day: 25.00    Types: Cigarettes  . Smokeless tobacco: Never Used    Substance and Sexual Activity  . Alcohol use: Yes    Alcohol/week: 0.0 standard drinks  . Drug use: Not Currently  . Sexual activity: Not on file  Other Topics Concern  . Not on file  Social History Narrative   [employer] fan   Married 2016, 2 stepkids, 1 biological son.    Works at [employer] Strain:   . Difficulty of Paying Living Expenses: Not on file  Food Insecurity:   . Worried About Charity fundraiser in the Last Year: Not on file  . Ran Out of Food in the Last Year: Not on file  Transportation Needs:   . Lack of Transportation (Medical): Not on file  . Lack of Transportation (Non-Medical): Not on file  Physical Activity:   . Days of Exercise per Week: Not on file  . Minutes of Exercise per Session: Not on file  Stress:   . Feeling of Stress : Not on file  Social Connections:   . Frequency of Communication with Friends and Family: Not on file  . Frequency of Social Gatherings with Friends and Family: Not on file  . Attends Religious Services: Not on file  . Active Member of Clubs or Organizations: Not on file  . Attends Archivist Meetings: Not on file  . Marital Status: Not on file  Intimate Partner Violence:   . Fear of Current or Ex-Partner: Not on file  . Emotionally Abused: Not on file  . Physically Abused: Not on file  . Sexually Abused: Not on file    Family History:    Family History  Problem Relation Age of Onset  . Heart disease Mother   . Diabetes Father   . Hypertension Father   . Prostate cancer Maternal Grandfather        possible prostate cancer  . Colon cancer Neg Hx      ROS:  Please see the history of present illness.   All other ROS reviewed and negative.     Physical Exam/Data:   Vitals:   02/09/20 0750 02/09/20 0751 02/09/20 0800 02/09/20 0830  BP:   115/79 130/89  Pulse: 65 100 77 83  Resp: (!) 21 13 (!) 21 (!) 25  Temp:      TempSrc:      SpO2: 97% 99%  99% 99%  Weight:      Height:       No intake or output data in the 24 hours ending 02/09/20 1035 Last 3 Weights 02/09/2020 06/04/2019 04/25/2019  Weight (lbs) 232 lb 230 lb 234 lb  Weight (kg) 105.235 kg 104.327 kg 106.142 kg     Body mass index is 36.34 kg/m.  General:  Well nourished, well developed, in no  acute distress HEENT: normal Lymph: no adenopathy Neck: no JVD Endocrine:  No thryomegaly Vascular: No carotid bruits; FA pulses 2+ bilaterally without bruits  Cardiac: Irregular irregular, no murmurs Lungs:  clear to auscultation bilaterally, no wheezing, rhonchi or rales  Abd: soft, nontender, no hepatomegaly  Ext: no edema Musculoskeletal:  No deformities, BUE and BLE strength normal and equal Skin: warm and dry  Neuro:  CNs 2-12 intact, no focal abnormalities noted Psych:  Normal affect   EKG:  The EKG was personally reviewed and demonstrates: Atrial fibrillation Telemetry:  Telemetry was personally reviewed and demonstrates: Atrial fibrillation  Relevant CV Studies: Echo 10/2017 - Left ventricle: The cavity size was normal. There was moderate  concentric hypertrophy. Systolic function was normal. The  estimated ejection fraction was in the range of 55% to 60%. Wall  motion was normal; there were no regional wall motion  abnormalities.  - Aorta: Aortic root dimension: 42 mm (ED).  - Aortic root: The aortic root was mildly dilated.  - Left atrium: The atrium was mildly dilated.  - Right atrium: The atrium was mildly dilated.  - Pulmonary arteries: Systolic pressure could not be accurately  estimated.   Laboratory Data:  High Sensitivity Troponin:   Recent Labs  Lab 02/09/20 0641  TROPONINIHS 39*     Chemistry Recent Labs  Lab 02/09/20 0641  NA 138  K 3.6  CL 103  CO2 24  GLUCOSE 145*  BUN 32*  CREATININE 2.64*  CALCIUM 9.8  GFRNONAA 31*  ANIONGAP 11    Recent Labs  Lab 02/09/20 0641  PROT 7.5  ALBUMIN 4.4  AST 16  ALT 17  ALKPHOS  61  BILITOT 1.4*   Hematology Recent Labs  Lab 02/09/20 0641  WBC 15.9*  RBC 5.94*  HGB 18.2*  HCT 51.9  MCV 87.4  MCH 30.6  MCHC 35.1  RDW 12.8  PLT 260   BNPNo results for input(s): BNP, PROBNP in the last 168 hours.  DDimer No results for input(s): DDIMER in the last 168 hours.   Radiology/Studies:  DG Chest 2 View  Result Date: 02/09/2020 CLINICAL DATA:  History of hypertension, new onset AFib and palpitations. EXAM: CHEST - 2 VIEW COMPARISON:  11/08/2017. FINDINGS: Borderline enlargement the cardiac silhouette. Both lungs are clear. No visible pleural effusions or pneumothorax. The visualized skeletal structures are unremarkable. IMPRESSION: No active cardiopulmonary disease. Electronically Signed   By: Margaretha Sheffield MD   On: 02/09/2020 07:56     Assessment and Plan:   1. New onset atrial fibrillation -Heart rate improved after IV diltiazem 20mg  x 1 -CHA2DS2-VASc of 1 (htn) -Start p.o. diltiazem 60 mg 3 times daily -Repeat echo ordered, will review -Start Eliquis 5 mg twice daily -If patient stays symptom free, and echo with no reduction in EF, will plan for cardioversion as outpatient after 3 weeks of anticoagulation.  Convert/consolidate to long-acting diltiazem upon discharge.  2.  Hypertension -BP controlled. -Continue irbesartan 150 mg daily -Cardizem as above -Increase dose of Cardizem and/or ibesartan for adequate blood pressure control if needed (hopefully BP is controlled on these 2 meds only to increase compliance). -Stop PTA clonidine, amlodipine, hydralazine, coreg.  3.  CKD 3 -Likely secondary to long-term uncontrolled BP -Referral to nephrology advised as outpatient  4.  Substance abuse -Smokes POT.  Cessation advised  Signed, [name], MD  02/09/2020 10:35 AM

## 2020-02-09 NOTE — ED Notes (Signed)
Advised nrse that patient has assigned bed

## 2020-02-10 DIAGNOSIS — R0602 Shortness of breath: Secondary | ICD-10-CM

## 2020-02-10 DIAGNOSIS — F17200 Nicotine dependence, unspecified, uncomplicated: Secondary | ICD-10-CM

## 2020-02-10 DIAGNOSIS — Z9114 Patient's other noncompliance with medication regimen: Secondary | ICD-10-CM

## 2020-02-10 LAB — LIPID PANEL
Cholesterol: 147 mg/dL (ref 0–200)
HDL: 31 mg/dL — ABNORMAL LOW (ref >40)
LDL Cholesterol: 99 mg/dL (ref 0–99)
Total CHOL/HDL Ratio: 4.7 RATIO
Triglycerides: 85 mg/dL (ref <150)
VLDL: 17 mg/dL (ref 0–40)

## 2020-02-10 LAB — CBC
HCT: 47.5 % (ref 39.0–52.0)
Hemoglobin: 17 g/dL (ref 13.0–17.0)
MCH: 30.7 pg (ref 26.0–34.0)
MCHC: 35.8 g/dL (ref 30.0–36.0)
MCV: 85.9 fL (ref 80.0–100.0)
Platelets: 205 10*3/uL (ref 150–400)
RBC: 5.53 MIL/uL (ref 4.22–5.81)
RDW: 12.8 % (ref 11.5–15.5)
WBC: 12.7 10*3/uL — ABNORMAL HIGH (ref 4.0–10.5)
nRBC: 0 % (ref 0.0–0.2)

## 2020-02-10 LAB — BASIC METABOLIC PANEL
Anion gap: 9 (ref 5–15)
BUN: 31 mg/dL — ABNORMAL HIGH (ref 6–20)
CO2: 24 mmol/L (ref 22–32)
Calcium: 9.2 mg/dL (ref 8.9–10.3)
Chloride: 105 mmol/L (ref 98–111)
Creatinine, Ser: 2.27 mg/dL — ABNORMAL HIGH (ref 0.61–1.24)
GFR, Estimated: 38 mL/min — ABNORMAL LOW (ref >60)
Glucose, Bld: 122 mg/dL — ABNORMAL HIGH (ref 70–99)
Potassium: 3.4 mmol/L — ABNORMAL LOW (ref 3.5–5.1)
Sodium: 138 mmol/L (ref 135–145)

## 2020-02-10 MED ORDER — CARVEDILOL 12.5 MG PO TABS
12.5000 mg | ORAL_TABLET | Freq: Once | ORAL | Status: AC
  Administered 2020-02-10: 12.5 mg via ORAL
  Filled 2020-02-10: qty 1

## 2020-02-10 MED ORDER — CARVEDILOL 25 MG PO TABS: 25.0000 mg | ORAL_TABLET | Freq: Two times a day (BID) | ORAL | Status: DC

## 2020-02-10 MED ORDER — DILTIAZEM HCL ER COATED BEADS 120 MG PO CP24
240.0000 mg | ORAL_CAPSULE | Freq: Every day | ORAL | Status: DC
  Administered 2020-02-10: 240 mg via ORAL
  Filled 2020-02-10: qty 2

## 2020-02-10 MED ORDER — APIXABAN 5 MG PO TABS: 5.0000 mg | ORAL_TABLET | Freq: Two times a day (BID) | ORAL | 11 refills | Status: AC

## 2020-02-10 MED ORDER — IRBESARTAN 150 MG PO TABS: 150.0000 mg | ORAL_TABLET | Freq: Every day | ORAL | 11 refills | Status: AC

## 2020-02-10 MED ORDER — POTASSIUM CHLORIDE CRYS ER 20 MEQ PO TBCR
40.0000 meq | EXTENDED_RELEASE_TABLET | Freq: Once | ORAL | Status: AC
  Administered 2020-02-10: 40 meq via ORAL
  Filled 2020-02-10: qty 2

## 2020-02-10 MED ORDER — CARVEDILOL 25 MG PO TABS: 25.0000 mg | ORAL_TABLET | Freq: Two times a day (BID) | ORAL | 11 refills | Status: AC

## 2020-02-10 MED ORDER — CARVEDILOL 12.5 MG PO TABS
12.5000 mg | ORAL_TABLET | Freq: Two times a day (BID) | ORAL | Status: DC
  Administered 2020-02-10: 12.5 mg via ORAL
  Filled 2020-02-10: qty 1

## 2020-02-10 MED ORDER — DILTIAZEM HCL ER COATED BEADS 240 MG PO CP24
240.0000 mg | ORAL_CAPSULE | Freq: Every day | ORAL | 11 refills | Status: DC
Start: 2020-02-11 — End: 2020-02-16

## 2020-02-10 NOTE — Progress Notes (Signed)
MD made aware of patients hypertension. No new orders RN instructed to proceed with discharge. I will continue to assess.

## 2020-02-10 NOTE — Progress Notes (Signed)
Pt is upset. Cardiology made aware verbal in person of patients concerns involving his home medication and new medication being given in hospital. Acknowledged by cardiology. Cardiology will round on patient. I will continue to assess.

## 2020-02-10 NOTE — Discharge Summary (Signed)
Physician Discharge Summary  Jeremiah Keith XAJ:[id number] DOB: [birth year] DOA: 02/09/2020  PCP: [name], MD  Admit date: 02/09/2020 Discharge date: 02/10/2020  Time spent: 26 minutes  Recommendations for Outpatient Follow-up:  1. See new medications on discharge including Eliquis  Discharge Diagnoses:  Principal Problem:   New onset atrial fibrillation ([place]) Active Problems:   HTN (hypertension)   CKD (chronic kidney disease)   Non compliance w medication regimen   Discharge Condition: Fair  Diet recommendation: Heart healthy  Filed Weights   02/09/20 0629 02/09/20 1139 02/10/20 0406  Weight: 105.2 kg 98.2 kg 99.1 kg    History of present illness:  34 year old white male Poorly controlled hypertension in the past with hypertensive urgency-stress test 2019 was negative for infarct Recent quitting tobacco abuse, continued marijuana use  prior right-sided pneumonia CKD 3 Presented to [facility] ED 48 hours palpitation in the setting of noncompliance X 7 days medications-found to be in A. fib RVR Rx diltiazem-cardiology consulted  Patient was seen and examined by cardiology and medications were adjusted and transitioned-he was quite eager to discharge home despite slightly rapid rates and was given explicit instructions regarding medications and need for probable outpatient cardioversion  It was felt that he was relatively stabilized at time of discharge and he was given the prescriptions for cardiac medications in the outpatient setting and will need labs in a week  Discharge Exam: Vitals:   02/10/20 0727 02/10/20 1117  BP: (!) 177/149 (!) 145/120  Pulse: (!) 102 90  Resp: 19 19  Temp: 98.2 F (36.8 C)   SpO2: 100% 97%    General: Awake alert oriented no distress Cardiovascular: S1-S2 no murmur no rub no gallop but in A. fib rate not controlled not 100 although ambulated the unit without issue Respiratory: Clear no added sound no rales no rhonchi Abdomen  soft no rebound no guarding No lower extremity edema  Discharge Instructions   Discharge Instructions    Amb referral to AFIB Clinic   Complete by: As directed    Diet - low sodium heart healthy   Complete by: As directed    Discharge instructions   Complete by: As directed    Ensure that you take all of your medications without fail-you will need in addition to medications describe something called Eliquis which is a blood thinner which prevents strokes from fast heartbeats-please follow-up with the cardiologist You will probably need to establish care with primary physician if you do not have 1 and get labs in about 1 to 2 weeks Report any dark stool tarry stool or uncontrolled heart rate to the physician if any of the above occur good luck   Increase activity slowly   Complete by: As directed      Allergies as of 02/10/2020      Reactions   Nsaids    Chronic kidney disease   Penicillins Rash   Has patient had a PCN reaction causing immediate rash, facial/tongue/throat swelling, SOB or lightheadedness with hypotension: Yes Has patient had a PCN reaction causing severe rash involving mucus membranes or skin necrosis: No Has patient had a PCN reaction that required hospitalization: No Has patient had a PCN reaction occurring within the last 10 years: No If all of the above answers are "NO", then may proceed with Cephalosporin use.      Medication List    STOP taking these medications   amLODipine 10 MG tablet Commonly known as: NORVASC   cloNIDine 0.2 MG tablet Commonly  known as: CATAPRES   hydrALAZINE 25 MG tablet Commonly known as: APRESOLINE     TAKE these medications   apixaban 5 MG Tabs tablet Commonly known as: ELIQUIS Take 1 tablet (5 mg total) by mouth 2 (two) times daily.   carvedilol 25 MG tablet Commonly known as: COREG Take 1 tablet (25 mg total) by mouth 2 (two) times daily with a meal. What changed:   medication strength  how much to take  when  to take this   diltiazem 240 MG 24 hr capsule Commonly known as: CARDIZEM CD Take 1 capsule (240 mg total) by mouth daily. Start taking on: February 11, 2020   HYDROcodone-acetaminophen 5-325 MG tablet Commonly known as: NORCO/VICODIN Take 1 tablet by mouth every 6 (six) hours as needed for moderate pain.   irbesartan 150 MG tablet Commonly known as: Avapro Take 1 tablet (150 mg total) by mouth daily.      Allergies  Allergen Reactions  . Nsaids     Chronic kidney disease  . Penicillins Rash    Has patient had a PCN reaction causing immediate rash, facial/tongue/throat swelling, SOB or lightheadedness with hypotension: Yes Has patient had a PCN reaction causing severe rash involving mucus membranes or skin necrosis: No Has patient had a PCN reaction that required hospitalization: No Has patient had a PCN reaction occurring within the last 10 years: No If all of the above answers are "NO", then may proceed with Cephalosporin use.     Follow-up Information    [name], MD Follow up in 1 week(s).   Specialties: Cardiology, Radiology Why: We will arrange for follow-up and contact you. Contact information:  [id number] [phone]                The results of significant diagnostics from this hospitalization (including imaging, microbiology, ancillary and laboratory) are listed below for reference.    Significant Diagnostic Studies: DG Chest 2 View  Result Date: 02/09/2020 CLINICAL DATA:  History of hypertension, new onset AFib and palpitations. EXAM: CHEST - 2 VIEW COMPARISON:  11/08/2017. FINDINGS: Borderline enlargement the cardiac silhouette. Both lungs are clear. No visible pleural effusions or pneumothorax. The visualized skeletal structures are unremarkable. IMPRESSION: No active cardiopulmonary disease. Electronically Signed   By: Margaretha Sheffield MD   On: 02/09/2020 07:56   ECHOCARDIOGRAM COMPLETE  Result Date:  02/09/2020    ECHOCARDIOGRAM REPORT   Patient Name:   Jeremiah Keith Date of Exam: 02/09/2020 Medical Rec #:  [id number]             Height:       67.0 in Accession #:    [id number]            Weight:       216.5 lb Date of Birth:  March 15, 1986            BSA:          2.092 m Patient Age:    34 years              BP:           153/112 mmHg Patient Gender: M                     HR:           78 bpm. Exam Location:  [facility] Procedure: 2D Echo, Cardiac Doppler and Color Doppler Indications:     Atrial Fibrillation 427.31  History:  Patient has prior history of Echocardiogram examinations, most                  recent 11/01/2017. Risk Factors:Hypertension. CKD.  Sonographer:     Sherrie Sport RDCS (AE) Referring Phys:  [id number] Collier Bullock Diagnosing Phys: [name] MD IMPRESSIONS  1. Left ventricular ejection fraction, by estimation, is 55 to 60%. The left ventricle has normal function. The left ventricle has no regional wall motion abnormalities. There is mild left ventricular hypertrophy. Left ventricular diastolic function could not be evaluated.  2. Right ventricular systolic function is normal. The right ventricular size is normal. There is normal pulmonary artery systolic pressure.  3. The mitral valve is normal in structure. No evidence of mitral valve regurgitation. No evidence of mitral stenosis.  4. The aortic valve is tricuspid. Aortic valve regurgitation is trivial. No aortic stenosis is present.  5. Aortic dilatation noted. There is mild dilatation of the aortic root, measuring 43 mm.  6. The inferior vena cava is normal in size with greater than 50% respiratory variability, suggesting right atrial pressure of 3 mmHg. FINDINGS  Left Ventricle: Left ventricular ejection fraction, by estimation, is 55 to 60%. The left ventricle has normal function. The left ventricle has no regional wall motion abnormalities. The left ventricular internal cavity size was normal in size. There is  mild left  ventricular hypertrophy. Left ventricular diastolic function could not be evaluated. Right Ventricle: The right ventricular size is normal. No increase in right ventricular wall thickness. Right ventricular systolic function is normal. There is normal pulmonary artery systolic pressure. The tricuspid regurgitant velocity is 1.34 m/s, and  with an assumed right atrial pressure of 3 mmHg, the estimated right ventricular systolic pressure is 50.3 mmHg. Left Atrium: Left atrial size was normal in size. Right Atrium: Right atrial size was normal in size. Pericardium: There is no evidence of pericardial effusion. Mitral Valve: The mitral valve is normal in structure. No evidence of mitral valve regurgitation. No evidence of mitral valve stenosis. Tricuspid Valve: The tricuspid valve is normal in structure. Tricuspid valve regurgitation is not demonstrated. No evidence of tricuspid stenosis. Aortic Valve: The aortic valve is tricuspid. Aortic valve regurgitation is trivial. No aortic stenosis is present. Aortic valve mean gradient measures 2.5 mmHg. Aortic valve peak gradient measures 4.9 mmHg. Aortic valve area, by VTI measures 3.20 cm. Pulmonic Valve: The pulmonic valve was normal in structure. Pulmonic valve regurgitation is not visualized. No evidence of pulmonic stenosis. Aorta: Aortic dilatation noted. There is mild dilatation of the aortic root, measuring 43 mm. Venous: The inferior vena cava is normal in size with greater than 50% respiratory variability, suggesting right atrial pressure of 3 mmHg. IAS/Shunts: No atrial level shunt detected by color flow Doppler.  LEFT VENTRICLE PLAX 2D LVIDd:         4.58 cm LVIDs:         3.39 cm LV PW:         1.97 cm LV IVS:        1.73 cm LVOT diam:     2.20 cm LV SV:         50 LV SV Index:   24 LVOT Area:     3.80 cm  RIGHT VENTRICLE RV S prime:     11.10 cm/s TAPSE (M-mode): 3.2 cm LEFT ATRIUM             Index       RIGHT ATRIUM  Index LA diam:        3.90 cm  1.86 cm/m  RA Area:     18.40 cm LA Vol (A2C):   77.3 ml 36.96 ml/m RA Volume:   47.40 ml  22.66 ml/m LA Vol (A4C):   49.3 ml 23.57 ml/m LA Biplane Vol: 63.1 ml 30.17 ml/m  AORTIC VALVE                   PULMONIC VALVE AV Area (Vmax):    2.95 cm    PV Vmax:        0.71 m/s AV Area (Vmean):   2.96 cm    PV Peak grad:   2.0 mmHg AV Area (VTI):     3.20 cm    RVOT Peak grad: 2 mmHg AV Vmax:           110.40 cm/s AV Vmean:          70.150 cm/s AV VTI:            0.157 m AV Peak Grad:      4.9 mmHg AV Mean Grad:      2.5 mmHg LVOT Vmax:         85.70 cm/s LVOT Vmean:        54.600 cm/s LVOT VTI:          0.132 m LVOT/AV VTI ratio: 0.84  AORTA Ao Root diam: 4.30 cm MITRAL VALVE               TRICUSPID VALVE MV Area (PHT): 4.60 cm    TR Peak grad:   7.2 mmHg MV Decel Time: 165 msec    TR Vmax:        134.00 cm/s MV E velocity: 95.00 cm/s                            SHUNTS                            Systemic VTI:  0.13 m                            Systemic Diam: 2.20 cm [name] MD Electronically signed by [name] MD Signature Date/Time: 02/09/2020/2:32:10 PM    Final     Microbiology: Recent Results (from the past 240 hour(s))  Resp Panel by RT-PCR (Flu A&B, Covid) Nasopharyngeal Swab     Status: None   Collection Time: 02/09/20  7:23 AM   Specimen: Nasopharyngeal Swab; Nasopharyngeal(NP) swabs in vial transport medium  Result Value Ref Range Status   SARS Coronavirus 2 by RT PCR NEGATIVE NEGATIVE Final    Comment: (NOTE) SARS-CoV-2 target nucleic acids are NOT DETECTED.  The SARS-CoV-2 RNA is generally detectable in upper respiratory specimens during the acute phase of infection. The lowest concentration of SARS-CoV-2 viral copies this assay can detect is 138 copies/mL. A negative result does not preclude SARS-Cov-2 infection and should not be used as the sole basis for treatment or other patient management decisions. A negative result may occur with  improper specimen  collection/handling, submission of specimen other than nasopharyngeal swab, presence of viral mutation(s) within the areas targeted by this assay, and inadequate number of viral copies(<138 copies/mL). A negative result must be combined with clinical observations, patient history, and epidemiological information. The expected result is Negative.  Fact Sheet  for Patients:  EntrepreneurPulse.com.au  Fact Sheet for Healthcare Providers:  IncredibleEmployment.be  This test is no t yet approved or cleared by the [place] FDA and  has been authorized for detection and/or diagnosis of SARS-CoV-2 by FDA under an Emergency Use Authorization (EUA). This EUA will remain  in effect (meaning this test can be used) for the duration of the COVID-19 declaration under Section 564(b)(1) of the Act, 21 U.S.C.section 360bbb-3(b)(1), unless the authorization is terminated  or revoked sooner.       Influenza A by PCR NEGATIVE NEGATIVE Final   Influenza B by PCR NEGATIVE NEGATIVE Final    Comment: (NOTE) The Xpert Xpress SARS-CoV-2/FLU/RSV plus assay is intended as an aid in the diagnosis of influenza from Nasopharyngeal swab specimens and should not be used as a sole basis for treatment. Nasal washings and aspirates are unacceptable for Xpert Xpress SARS-CoV-2/FLU/RSV testing.  Fact Sheet for Patients: EntrepreneurPulse.com.au  Fact Sheet for Healthcare Providers: IncredibleEmployment.be  This test is not yet approved or cleared by the [place] FDA and has been authorized for detection and/or diagnosis of SARS-CoV-2 by FDA under an Emergency Use Authorization (EUA). This EUA will remain in effect (meaning this test can be used) for the duration of the COVID-19 declaration under Section 564(b)(1) of the Act, 21 U.S.C. section 360bbb-3(b)(1), unless the authorization is terminated or revoked.  Performed at [facility], [address] [place], [place] [address]      Labs: Basic Metabolic Panel: Recent Labs  Lab 02/09/20 0641 02/10/20 0605  NA 138 138  K 3.6 3.4*  CL 103 105  CO2 24 24  GLUCOSE 145* 122*  BUN 32* 31*  CREATININE 2.64* 2.27*  CALCIUM 9.8 9.2  MG 2.3  --    Liver Function Tests: Recent Labs  Lab 02/09/20 0641  AST 16  ALT 17  ALKPHOS 61  BILITOT 1.4*  PROT 7.5  ALBUMIN 4.4   Recent Labs  Lab 02/09/20 0641  LIPASE 35   No results for input(s): AMMONIA in the last 168 hours. CBC: Recent Labs  Lab 02/09/20 0641 02/10/20 0605  WBC 15.9* 12.7*  NEUTROABS 12.0*  --   HGB 18.2* 17.0  HCT 51.9 47.5  MCV 87.4 85.9  PLT 260 205   Cardiac Enzymes: No results for input(s): CKTOTAL, CKMB, CKMBINDEX, TROPONINI in the last 168 hours. BNP: BNP (last 3 results) No results for input(s): BNP in the last 8760 hours.  ProBNP (last 3 results) No results for input(s): PROBNP in the last 8760 hours.  CBG: No results for input(s): GLUCAP in the last 168 hours.     Signed:  Nita Sells MD   [facility] 02/10/2020, 11:49 AM

## 2020-02-10 NOTE — Progress Notes (Addendum)
Progress Note  Patient Name: Jeremiah Keith Date of Encounter: 02/10/2020  Primary Cardiologist: [name], MD  Subjective   Anxious about his medicines.  Eager to go home.  Long discussion about prior meds, current meds, and goal of mgmt of Afib. Denies c/p, dyspnea, palpitations.  Inpatient Medications    Scheduled Meds: . apixaban  5 mg Oral BID  . diltiazem  60 mg Oral TID AC  . irbesartan  150 mg Oral Daily  . sodium chloride flush  3 mL Intravenous Q12H   Continuous Infusions: . sodium chloride     PRN Meds: sodium chloride, acetaminophen, ondansetron (ZOFRAN) IV, sodium chloride flush   Vital Signs    Vitals:   02/09/20 1634 02/09/20 1951 02/10/20 0406 02/10/20 0727  BP: (!) 152/112 (!) 150/102 (!) 166/123 (!) 177/149  Pulse: 78 81 96 (!) 102  Resp: 16 16 16 19   Temp: (!) 97.5 F (36.4 C) 98.1 F (36.7 C) 97.9 F (36.6 C) 98.2 F (36.8 C)  TempSrc:  Oral Oral Oral  SpO2: 100% 99% 99% 100%  Weight:   99.1 kg   Height:        Intake/Output Summary (Last 24 hours) at 02/10/2020 0757 Last data filed at 02/09/2020 1951 Gross per 24 hour  Intake 600 ml  Output 400 ml  Net 200 ml   Filed Weights   02/09/20 0629 02/09/20 1139 02/10/20 0406  Weight: 105.2 kg 98.2 kg 99.1 kg    Physical Exam   GEN: Well nourished, well developed, in no acute distress.  HEENT: Grossly normal.  Neck: Supple, no JVD, carotid bruits, or masses. Cardiac: IR, IR, tachy, no murmurs, rubs, or gallops. No clubbing, cyanosis, edema.  Radials 2+, DP/PT 2+ and equal bilaterally.  Respiratory:  Respirations regular and unlabored, clear to auscultation bilaterally. GI: Soft, nontender, nondistended, BS + x 4. MS: no deformity or atrophy. Skin: warm and dry, no rash. Neuro:  Strength and sensation are intact. Psych: AAOx3.  Anxious. Pressured speech.  Labs    Chemistry Recent Labs  Lab 02/09/20 0641 02/10/20 0605  NA 138 138  K 3.6 3.4*  CL 103 105  CO2  24 24  GLUCOSE 145* 122*  BUN 32* 31*  CREATININE 2.64* 2.27*  CALCIUM 9.8 9.2  PROT 7.5  --   ALBUMIN 4.4  --   AST 16  --   ALT 17  --   ALKPHOS 61  --   BILITOT 1.4*  --   GFRNONAA 31* 38*  ANIONGAP 11 9     Hematology Recent Labs  Lab 02/09/20 0641 02/10/20 0605  WBC 15.9* 12.7*  RBC 5.94* 5.53  HGB 18.2* 17.0  HCT 51.9 47.5  MCV 87.4 85.9  MCH 30.6 30.7  MCHC 35.1 35.8  RDW 12.8 12.8  PLT 260 205    Cardiac Enzymes  Recent Labs  Lab 02/09/20 0641 02/09/20 1118 02/09/20 1241 02/09/20 1504  TROPONINIHS 39* 35* 33* 35*      Lipids  Lab Results  Component Value Date   CHOL 147 02/10/2020   HDL 31 (L) 02/10/2020   LDLCALC 99 02/10/2020   TRIG 85 02/10/2020   CHOLHDL 4.7 02/10/2020   Lab Results  Component Value Date   TSH 2.870 02/09/2020    Radiology    DG Chest 2 View  Result Date: 02/09/2020 CLINICAL DATA:  History of hypertension, new onset AFib and palpitations. EXAM: CHEST - 2 VIEW COMPARISON:  11/08/2017. FINDINGS: Borderline enlargement the cardiac  silhouette. Both lungs are clear. No visible pleural effusions or pneumothorax. The visualized skeletal structures are unremarkable. IMPRESSION: No active cardiopulmonary disease. Electronically Signed   By: Margaretha Sheffield MD   On: 02/09/2020 07:56   Telemetry    Afib, 80's to low 100's - Personally Reviewed  Cardiac Studies   2D Echocardiogram 11.26.2021  1. Left ventricular ejection fraction, by estimation, is 55 to 60%. The  left ventricle has normal function. The left ventricle has no regional  wall motion abnormalities. There is mild left ventricular hypertrophy.  Left ventricular diastolic function  could not be evaluated.  2. Right ventricular systolic function is normal. The right ventricular  size is normal. There is normal pulmonary artery systolic pressure.  3. The mitral valve is normal in structure. No evidence of mitral valve  regurgitation. No evidence of mitral  stenosis.  4. The aortic valve is tricuspid. Aortic valve regurgitation is trivial.  No aortic stenosis is present.  5. Aortic dilatation noted. There is mild dilatation of the aortic root,  measuring 43 mm.  6. The inferior vena cava is normal in size with greater than 50%  respiratory variability, suggesting right atrial pressure of 3 mmHg.  _____________   Patient Profile     34 y.o. male w/ a h/o HTN, CKD III, and tob abuse (quit 2 wks ago), who was admitted 11/26 w/ Afib RVR.  Assessment & Plan    1.  Afib w/ RVR:  Admitted w/ tachypalps on 11/26 and found to be in afib.  Had been out of antihypertensives x 2 wks due to cost/lack of insurance.  CHA2DSD2VASc = 1.  Rates 80's to low 100's this  Morning but jumped to 160's w/ agitation.  Echo showed nl EF, nl PASP.  TSH nl.  K low  supp.  Currently on dilt 60 TID.  Will titrate and consolidate to 240 daily.  He was no carvedilol previously and I will resume (12.5 BID).  Eliquis 5 BID started.  He is currently working on Engineer, mining.  Discussed need for 30 day free card w/ RN.  Plan to rate control and seek DCCV in 4 wks.  At that point, will need eliquis for at least 4 more weeks and if insurance hasn't come through, we can look into pt assistance vs samples.  Will arrange for outpt f/u in ~ 7-10 days.  2.  Essential HTN:  Uncontrolled and trending in 150s to 170s.  Ran out of home meds ~ 2 wks ago (amlodipine 10 qd, carvedilol 12.5 bid, clonidine 0.2mg  tid, hydralazine 25 tid, irbesartan 150 qd).  Currently on dilt 60 tid and irbesartan 150 daily.  With elevated HRs and pressures, will resume carvedilol.  Consolidate dilt to 240 daily.  Cont irbesartan (looks reasonably priced @ that dose ($9/month).  3. CKD III:  Creat stable @ 2.27 this AM.  4. Tob Abuse:  Quit 2 wks ago.  Congratulated and encouraged to remain off of cigarettes.  5. Hypokalemia:  Supplement.  Mg nl.  6.  Elevated HsTroponin/Demand Ischemia:  Mild HsTrop elev w/  flat trend.  Echo w/ nl EF and w/o reg wma.  Suspect mild demand ischemia in setting of Afib RVR.  No plan for ischemic eval at this time.  Signed, Murray Hodgkins, NP  02/10/2020, 7:57 AM    For questions or updates, please contact   Please consult [email or web address] for contact info under Cardiology/STEMI.

## 2020-02-16 ENCOUNTER — Telehealth: Payer: Self-pay | Admitting: Cardiology

## 2020-02-16 ENCOUNTER — Encounter: Payer: Self-pay | Admitting: Nurse Practitioner

## 2020-02-16 ENCOUNTER — Other Ambulatory Visit: Payer: Self-pay

## 2020-02-16 ENCOUNTER — Ambulatory Visit (INDEPENDENT_AMBULATORY_CARE_PROVIDER_SITE_OTHER): Payer: Self-pay | Admitting: Nurse Practitioner

## 2020-02-16 VITALS — BP 140/94 | HR 94 | Ht 67.0 in | Wt 225.0 lb

## 2020-02-16 DIAGNOSIS — N184 Chronic kidney disease, stage 4 (severe): Secondary | ICD-10-CM

## 2020-02-16 DIAGNOSIS — Z720 Tobacco use: Secondary | ICD-10-CM

## 2020-02-16 DIAGNOSIS — I10 Essential (primary) hypertension: Secondary | ICD-10-CM

## 2020-02-16 DIAGNOSIS — I77810 Thoracic aortic ectasia: Secondary | ICD-10-CM

## 2020-02-16 DIAGNOSIS — I4819 Other persistent atrial fibrillation: Secondary | ICD-10-CM

## 2020-02-16 MED ORDER — DILTIAZEM HCL ER COATED BEADS 300 MG PO CP24: 300.0000 mg | ORAL_CAPSULE | Freq: Every day | ORAL | 0 refills | Status: DC

## 2020-02-16 NOTE — Patient Instructions (Signed)
Medication Instructions:  INCREASE diltiazem to 300mg  daily.  *If you need a refill on your cardiac medications before your next appointment, please call your pharmacy*   Lab Work: None ordered  If you have labs (blood work) drawn today and your tests are completely normal, you will receive your results only by:  [place] (if you have MyChart) OR  A paper copy in the mail If you have any lab test that is abnormal or we need to change your treatment, we will call you to review the results.   Testing/Procedures: None ordered   Follow-Up: At [facility], you and your health needs are our priority.  As part of our continuing mission to provide you with exceptional heart care, we have created designated Provider Care Teams.  These Care Teams include your primary Cardiologist (physician) and Advanced Practice Providers (APPs -  Physician Assistants and Nurse Practitioners) who all work together to provide you with the care you need, when you need it.  We recommend signing up for the patient portal called "MyChart".  Sign up information is provided on this After Visit Summary.  MyChart is used to connect with patients for Virtual Visits (Telemedicine).  Patients are able to view lab/test results, encounter notes, upcoming appointments, etc.  Non-urgent messages can be sent to your provider as well.   To learn more about what you can do with MyChart, go to [email or web address].    Your next appointment:   2 week(s)  The format for your next appointment:   In Person  Provider:   You may see [name], MD or one of the following Advanced Practice Providers on your designated Care Team:    Murray Hodgkins, NP

## 2020-02-16 NOTE — Telephone Encounter (Signed)
Spoke with patient who states the pharmacist told him the prescription would be $50 but they could use a coupon from [employer] which would make the prescription $20. He states he still did not have the $20. Advised him that the [facility] coupon to get the medication at [facility] is $15. Patient declined and stated he would borrow the money off of his mother and go back and get the medication. Patient expressed gratitude for offering to send it elsewhere.

## 2020-02-16 NOTE — Progress Notes (Signed)
Office Visit    Patient Name: Jeremiah Keith Date of Encounter: 02/16/2020  Primary Care Provider:  [name], MD Primary Cardiologist:  [name], MD  Chief Complaint    34 year-old male with a history of newly diagnosed atrial fibrillation on Eliquis (CHA2DS2VASc=1), uncontrolled hypertension, CKD stage 4, depression, tobacco and substance abuse (h/o cocaine abuse, marijuana), who presents for follow-up related to atrial fibrillation.   Past Medical History    Past Medical History:  Diagnosis Date  . Aortic root dilatation ([place])    a. 01/2020 Echo: Ao root 4.3cm.  . CKD (chronic kidney disease)   . Depression    after death of family members  . History of chicken pox   . History of echocardiogram    a. 01/2020 Echo: EF 55-60%. No rwma. Mild LVH. Triv AI. Ao root 4.3 cm.  . History of stress test    a. 10/2017 MV: EF 40-45%, diff HK. No scar/ischemia-->intermediate risk in setting of depressed EF however, EF 55-60% by echo-->med managed.  . Hypertension   . PAF (paroxysmal atrial fibrillation) (HCC)    a. CHA2DS2VASc = 1-->eliquis.  . Substance abuse [facility])    h/o cocaine abuse   Past Surgical History:  Procedure Laterality Date  . TONSILLECTOMY AND ADENOIDECTOMY  1990    Allergies  Allergies  Allergen Reactions  . Nsaids     Chronic kidney disease  . Penicillins Rash    Has patient had a PCN reaction causing immediate rash, facial/tongue/throat swelling, SOB or lightheadedness with hypotension: Yes Has patient had a PCN reaction causing severe rash involving mucus membranes or skin necrosis: No Has patient had a PCN reaction that required hospitalization: No Has patient had a PCN reaction occurring within the last 10 years: No If all of the above answers are "NO", then may proceed with Cephalosporin use.     History of Present Illness    34 year-old male with the above past medical history including newly diagnosed atrial fibrillation  on Eliquis, uncontrolled hypertension, CKD stage 4, depression, tobacco and substance abuse (h/o cocaine abuse, marijuana), who presents for follow-up related to atrial fibrillation. He was hospitalized in August 2019 for hypertensive emergency with evidence of end organ damage/CKD. Troponin was elevated at the time and thought to be related to demand ischemia. Echo showed an EF of 55-60%, no RWMA, mild aortic rood dilation. He was discharged on medical therapy.   He was seen in clinic for follow-up in February 2021. His BP remained uncontrolled at the time in the setting of non adherence and cost issues. His medications were adjusted and he was referred to nephrology for CKD stage 4. He presented to the ED 02/09/2020 with palpitations. He ran out of his BP medication two weeks prior and was found to have new onset atrial fibrillation. HsTpn was mildly elevated with a flat trend. This was thought to be due to demand ischemia in the setting of new a fib RVR. Repeat echo showed a normal EF, normal PSAP. TSH was also normal. He was discharged on irbesartan, diltiazem, carvedilol, and eliquis 5 bid with plans for rate control and uninterrupted anticoagulation in anticipation of possible cardioversion and close follow-up.   Since his hospitalization he has continued to feel fatigued with decreased activity tolerance. 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 He reports dyspnea on exertion with minimal activity. He denies chest pain, palpitations, pnd, orthopnea, n, v, dizziness, syncope, edema,  weight gain, or early satiety. He remains in a fib today. He is taking his medication as prescribed, and has not missed a dose of his Eliquis to date. He is not checking his BP at home as he does not have a BP cuff. He states that sometimes when he feels bad and thinks his BP might be elevated he takes an additional 12.5 mg of carvedilol. He is also in the process of applying for Medicaid.   Home  Medications    Prior to Admission medications   Medication Sig Start Date End Date Taking? Authorizing Provider  apixaban (ELIQUIS) 5 MG TABS tablet Take 1 tablet (5 mg total) by mouth 2 (two) times daily. 02/10/20   Nita Sells, MD  carvedilol (COREG) 25 MG tablet Take 1 tablet (25 mg total) by mouth 2 (two) times daily with a meal. 02/10/20   Nita Sells, MD  diltiazem (CARDIZEM CD) 240 MG 24 hr capsule Take 1 capsule (240 mg total) by mouth daily. 02/11/20   Nita Sells, MD  HYDROcodone-acetaminophen (NORCO/VICODIN) 5-325 MG tablet Take 1 tablet by mouth every 6 (six) hours as needed for moderate pain. Patient not taking: Reported on 02/09/2020 06/04/19   [name], PA-C  irbesartan (AVAPRO) 150 MG tablet Take 1 tablet (150 mg total) by mouth daily. 02/10/20   Nita Sells, MD    Review of Systems    Easy fatigability and decreased activity tolerance. Dyspnea on exertion. He denies chest pain, palpitations, pnd, orthopnea, n, v, dizziness, syncope, edema, weight gain, or early satiety. All other systems reviewed and are otherwise negative except as noted above.  Physical Exam    VS:  BP (!) 140/94 (BP Location: Left Arm, Patient Position: Sitting, Cuff Size: Normal)   Pulse 94   Ht 5\' 7"  (1.702 m)   Wt 225 lb (102.1 kg)   SpO2 98%   BMI 35.24 kg/m   GEN: Well nourished, well developed, in no acute distress. HEENT: normal. Neck: Supple, no JVD, carotid bruits, or masses. Cardiac: IR, IR no murmurs, rubs, or gallops. No clubbing, cyanosis, edema. PT 2+ and equal bilaterally.  Respiratory:  Respirations regular and unlabored, clear to auscultation bilaterally. GI: Soft, nontender, nondistended, BS + x 4. MS: no deformity or atrophy. Skin: warm and dry, no rash. Neuro:  Strength and sensation are intact. Psych: Normal affect.  Accessory Clinical Findings    ECG personally reviewed by me today -Atrial fibrillation, 94, RBBB- no acute  changes.  Lab Results  Component Value Date   WBC 12.7 (H) 02/10/2020   HGB 17.0 02/10/2020   HCT 47.5 02/10/2020   MCV 85.9 02/10/2020   PLT 205 02/10/2020   Lab Results  Component Value Date   CREATININE 2.27 (H) 02/10/2020   BUN 31 (H) 02/10/2020   NA 138 02/10/2020   K 3.4 (L) 02/10/2020   CL 105 02/10/2020   CO2 24 02/10/2020   Lab Results  Component Value Date   ALT 17 02/09/2020   AST 16 02/09/2020   ALKPHOS 61 02/09/2020   BILITOT 1.4 (H) 02/09/2020   Lab Results  Component Value Date   CHOL 147 02/10/2020   HDL 31 (L) 02/10/2020   LDLCALC 99 02/10/2020   TRIG 85 02/10/2020   CHOLHDL 4.7 02/10/2020    Assessment & Plan    1.  Persistent atrial fibrillation: Newly diagnosed as of 02/09/2020 during recent hospitilaztion. He remains in relatively rate-controlled a fib today, HR 94. He is symptomatic with decreased activity  tolerance and dyspnea on exertion. He has been medically managed on Diltiazem CD 240 mg, carvedilol 25 bid, and eliquis 5 bid, with plans for rate control in anticipation of possible cardioversion following 4 weeks of uninterrupted anticoagulation with Eliquis. Unfortunately, he does not have insurance, but is in the process of applying for Medicaid. His BP is improved, but remains elevated above goal. Ideally, we would plan to proceed with cardioversion in 3 weeks. For now, will increase diltiazem to 300 mg daily to achieve better blood pressure and rate control. Continue carvedilol, Eliquis, and follow-up in clinic in 2 weeks to reassess and potentially schedule cardioversion.   2. Essential hypertension: BP 140/94 in office today. Overall improved, however, he remains above goal. To maximize benefit, will increase diltiazem to 300 mg daily in order to achieve better blood pressure control and heart rate control in the setting of persistent atrial fibrillation. Continue irbesartan, carvedilol.   3. CKD stage 4: Ongoing in the setting of poorly  controlled hypertension. He has not followed up with nephrology, but plans to do so. Crt 2.27 on 02/10/2020. Follow-up with nephrology as discussed. Repeat BMET at next follow-up visit.   4. Tobacco abuse: He has quit smoking though he is still smoking marijuana. Stressed the importance of continued smoking cessation.   5. Dilated Ao root:  4.3cm on echo.  F/u in a year.  6.  Disposition: Increase diltiazem to 300 mg daily. Continue Eliquis, carvedilol, and irbesartan. Repeat BMET at next follow-up visit. Follow-up in clinic in 2 weeks to reassess BP/HR/dyspnea, and discuss scheduling cardioversion for persistent a fib.   Murray Hodgkins, NP 02/16/2020, 5:43 PM

## 2020-02-16 NOTE — Telephone Encounter (Signed)
Patient calling  States that the new medication that was prescribed at visit today he is unable to afford  Please call to discuss different options

## 2020-03-01 ENCOUNTER — Ambulatory Visit: Payer: Self-pay | Admitting: Nurse Practitioner

## 2020-03-01 NOTE — Progress Notes (Deleted)
Office Visit    Patient Name: Jeremiah Keith Date of Encounter: 03/01/2020  Primary Care Provider:  [name], MD Primary Cardiologist:  [name], MD  Chief Complaint    34 year old male with a history of newly diagnosed atrial fibrillation on Eliquis (CHA2DS2-VASc equals 1), uncontrolled hypertension, stage IV chronic kidney disease, depression, tobacco and substance abuse (history of cocaine abuse and marijuana), who presents for follow-up related to atrial fibrillation.  Past Medical History    Past Medical History:  Diagnosis Date  . Aortic root dilatation ([place])    a. 01/2020 Echo: Ao root 4.3cm.  . CKD (chronic kidney disease)   . Depression    after death of family members  . History of chicken pox   . History of echocardiogram    a. 01/2020 Echo: EF 55-60%. No rwma. Mild LVH. Triv AI. Ao root 4.3 cm.  . History of stress test    a. 10/2017 MV: EF 40-45%, diff HK. No scar/ischemia-->intermediate risk in setting of depressed EF however, EF 55-60% by echo-->med managed.  . Hypertension   . PAF (paroxysmal atrial fibrillation) (HCC)    a. CHA2DS2VASc = 1-->eliquis.  . Substance abuse [facility]    h/o cocaine abuse   Past Surgical History:  Procedure Laterality Date  . TONSILLECTOMY AND ADENOIDECTOMY  1990    Allergies  Allergies  Allergen Reactions  . Nsaids     Chronic kidney disease  . Penicillins Rash    Has patient had a PCN reaction causing immediate rash, facial/tongue/throat swelling, SOB or lightheadedness with hypotension: Yes Has patient had a PCN reaction causing severe rash involving mucus membranes or skin necrosis: No Has patient had a PCN reaction that required hospitalization: No Has patient had a PCN reaction occurring within the last 10 years: No If all of the above answers are "NO", then may proceed with Cephalosporin use.     History of Present Illness    34 year old male with above past medical history including  newly diagnosed atrial fibrillation on Eliquis, uncontrolled hypertension, stage IV chronic kidney disease, depression, tobacco and substance abuse (cocaine/marijuana).  He was hospitalized in August 2019 for hypertensive emergency with evidence of endorgan damage/CKD.  Troponin was elevated at that time and thought to be related to demand ischemia.  Echo showed an EF of 55-60% without regional wall motion abnormalities and mild aortic root dilatation.  Medical therapy was recommended but he has had issues with noncompliance due to cost.  He was recently admitted to [facility] November 26 with palpitations and new onset A. fib.  He was markedly hypertensive..  He had been off of his medications for at least a few weeks.  High-sensitivity troponin was mildly elevated with a flat trend.  Echo showed normal LV function.  He was discharged on irbesartan, diltiazem, carvedilol, and Eliquis with plan for cardioversion after 4 weeks of anticoagulation.  At office follow-up December 3, he continued to report dyspnea on exertion.  Rates were reasonable at 94.  Diltiazem was increased to 300 mg daily in an effort to improve blood pressure and rate control.  Unfortunately, he was not able to afford this at his pharmacy and did not fill.  He does not have insurance and was hoping to delay further evaluation and cardioversion until he obtained Medicaid.  Home Medications    Prior to Admission medications   Medication Sig Start Date End Date Taking? Authorizing Provider  apixaban (ELIQUIS) 5 MG TABS tablet Take 1 tablet (5  mg total) by mouth 2 (two) times daily. 02/10/20   Nita Sells, MD  carvedilol (COREG) 25 MG tablet Take 1 tablet (25 mg total) by mouth 2 (two) times daily with a meal. 02/10/20   Nita Sells, MD  diltiazem (CARDIZEM CD) 300 MG 24 hr capsule Take 1 capsule (300 mg total) by mouth daily. 02/16/20   Theora Gianotti, NP  irbesartan (AVAPRO) 150 MG tablet Take 1 tablet (150 mg  total) by mouth daily. 02/10/20   Nita Sells, MD    Review of Systems    ***.  All other systems reviewed and are otherwise negative except as noted above.  Physical Exam    VS:  There were no vitals taken for this visit. , BMI There is no height or weight on file to calculate BMI. GEN: Well nourished, well developed, in no acute distress. HEENT: normal. Neck: Supple, no JVD, carotid bruits, or masses. Cardiac: RRR, no murmurs, rubs, or gallops. No clubbing, cyanosis, edema.  Radials/DP/PT 2+ and equal bilaterally.  Respiratory:  Respirations regular and unlabored, clear to auscultation bilaterally. GI: Soft, nontender, nondistended, BS + x 4. MS: no deformity or atrophy. Skin: warm and dry, no rash. Neuro:  Strength and sensation are intact. Psych: Normal affect.  Accessory Clinical Findings    ECG personally reviewed by me today - *** - no acute changes.  Lab Results  Component Value Date   WBC 12.7 (H) 02/10/2020   HGB 17.0 02/10/2020   HCT 47.5 02/10/2020   MCV 85.9 02/10/2020   PLT 205 02/10/2020   Lab Results  Component Value Date   CREATININE 2.27 (H) 02/10/2020   BUN 31 (H) 02/10/2020   NA 138 02/10/2020   K 3.4 (L) 02/10/2020   CL 105 02/10/2020   CO2 24 02/10/2020   Lab Results  Component Value Date   ALT 17 02/09/2020   AST 16 02/09/2020   ALKPHOS 61 02/09/2020   BILITOT 1.4 (H) 02/09/2020   Lab Results  Component Value Date   CHOL 147 02/10/2020   HDL 31 (L) 02/10/2020   LDLCALC 99 02/10/2020   TRIG 85 02/10/2020   CHOLHDL 4.7 02/10/2020    No results found for: HGBA1C  Assessment & Plan    1.  ***   Murray Hodgkins, NP 03/01/2020, 8:10 AM

## 2020-03-06 ENCOUNTER — Ambulatory Visit: Payer: Self-pay | Admitting: Nurse Practitioner

## 2020-03-06 NOTE — Progress Notes (Deleted)
Office Visit    Patient Name: Jeremiah Keith Date of Encounter: 03/06/2020  Primary Care Provider:  [name], MD Primary Cardiologist:  [name], MD  Chief Complaint    34 year old male with a history of newly diagnosed atrial fibrillation on Eliquis (CHA2DS2-VASc equals 1), uncontrolled hypertension, stage IV chronic kidney disease, depression, tobacco and substance abuse (history of cocaine abuse and marijuana), who presents for follow-up related to atrial fibrillation.  Past Medical History    Past Medical History:  Diagnosis Date  . Aortic root dilatation ([place])    a. 01/2020 Echo: Ao root 4.3cm.  . CKD (chronic kidney disease)   . Depression    after death of family members  . History of chicken pox   . History of echocardiogram    a. 01/2020 Echo: EF 55-60%. No rwma. Mild LVH. Triv AI. Ao root 4.3 cm.  . History of stress test    a. 10/2017 MV: EF 40-45%, diff HK. No scar/ischemia-->intermediate risk in setting of depressed EF however, EF 55-60% by echo-->med managed.  . Hypertension   . PAF (paroxysmal atrial fibrillation) (H[facility])    a. CHA2DS2VASc = 1-->eliquis.  . Substance abuse [facility])    h/o cocaine abuse   Past Surgical History:  Procedure Laterality Date  . TONSILLECTOMY AND ADENOIDECTOMY  1990    Allergies  Allergies  Allergen Reactions  . Nsaids     Chronic kidney disease  . Penicillins Rash    Has patient had a PCN reaction causing immediate rash, facial/tongue/throat swelling, SOB or lightheadedness with hypotension: Yes Has patient had a PCN reaction causing severe rash involving mucus membranes or skin necrosis: No Has patient had a PCN reaction that required hospitalization: No Has patient had a PCN reaction occurring within the last 10 years: No If all of the above answers are "NO", then may proceed with Cephalosporin use.     History of Present Illness    34 year old male with above past medical history including  newly diagnosed atrial fibrillation on Eliquis, uncontrolled hypertension, stage IV chronic kidney disease, depression, tobacco and substance abuse (cocaine/marijuana).  He was hospitalized in August 2019 for hypertensive emergency with evidence of endorgan damage/CKD.  Troponin was elevated at that time and thought to be related to demand ischemia.  Echo showed an EF of 55-60% without regional wall motion abnormalities and mild aortic root dilatation.  Medical therapy was recommended but he has had issues with noncompliance due to cost.  He was recently admitted to [facility] November 26 with palpitations and new onset A. fib.  He was markedly hypertensive..  He had been off of his medications for at least a few weeks.  High-sensitivity troponin was mildly elevated with a flat trend.  Echo showed normal LV function.  He was discharged on irbesartan, diltiazem, carvedilol, and Eliquis with plan for cardioversion after 4 weeks of anticoagulation.  At office follow-up December 3, he continued to report dyspnea on exertion.  Rates were reasonable at 94.  Diltiazem was increased to 300 mg daily in an effort to improve blood pressure and rate control.  Unfortunately, he was not able to afford this at his pharmacy and did not fill.  He does not have insurance and was hoping to delay further evaluation and cardioversion until he obtained Medicaid.  Home Medications    Prior to Admission medications   Medication Sig Start Date End Date Taking? Authorizing Provider  apixaban (ELIQUIS) 5 MG TABS tablet Take 1 tablet (5  mg total) by mouth 2 (two) times daily. 02/10/20   Nita Sells, MD  carvedilol (COREG) 25 MG tablet Take 1 tablet (25 mg total) by mouth 2 (two) times daily with a meal. 02/10/20   Nita Sells, MD  diltiazem (CARDIZEM CD) 300 MG 24 hr capsule Take 1 capsule (300 mg total) by mouth daily. 02/16/20   Theora Gianotti, NP  irbesartan (AVAPRO) 150 MG tablet Take 1 tablet (150 mg  total) by mouth daily. 02/10/20   Nita Sells, MD    Review of Systems    ***.  All other systems reviewed and are otherwise negative except as noted above.  Physical Exam    VS:  There were no vitals taken for this visit. , BMI There is no height or weight on file to calculate BMI. GEN: Well nourished, well developed, in no acute distress. HEENT: normal. Neck: Supple, no JVD, carotid bruits, or masses. Cardiac: RRR, no murmurs, rubs, or gallops. No clubbing, cyanosis, edema.  Radials/DP/PT 2+ and equal bilaterally.  Respiratory:  Respirations regular and unlabored, clear to auscultation bilaterally. GI: Soft, nontender, nondistended, BS + x 4. MS: no deformity or atrophy. Skin: warm and dry, no rash. Neuro:  Strength and sensation are intact. Psych: Normal affect.  Accessory Clinical Findings    ECG personally reviewed by me today - *** - no acute changes.  Lab Results  Component Value Date   WBC 12.7 (H) 02/10/2020   HGB 17.0 02/10/2020   HCT 47.5 02/10/2020   MCV 85.9 02/10/2020   PLT 205 02/10/2020   Lab Results  Component Value Date   CREATININE 2.27 (H) 02/10/2020   BUN 31 (H) 02/10/2020   NA 138 02/10/2020   K 3.4 (L) 02/10/2020   CL 105 02/10/2020   CO2 24 02/10/2020   Lab Results  Component Value Date   ALT 17 02/09/2020   AST 16 02/09/2020   ALKPHOS 61 02/09/2020   BILITOT 1.4 (H) 02/09/2020   Lab Results  Component Value Date   CHOL 147 02/10/2020   HDL 31 (L) 02/10/2020   LDLCALC 99 02/10/2020   TRIG 85 02/10/2020   CHOLHDL 4.7 02/10/2020    No results found for: HGBA1C  Assessment & Plan    1.  ***   Murray Hodgkins, NP 03/06/2020, 8:25 AM

## 2020-03-11 ENCOUNTER — Ambulatory Visit (INDEPENDENT_AMBULATORY_CARE_PROVIDER_SITE_OTHER): Payer: Self-pay | Admitting: Cardiology

## 2020-03-11 ENCOUNTER — Other Ambulatory Visit: Payer: Self-pay

## 2020-03-11 ENCOUNTER — Encounter: Payer: Self-pay | Admitting: Cardiology

## 2020-03-11 ENCOUNTER — Other Ambulatory Visit
Admission: RE | Admit: 2020-03-11 | Discharge: 2020-03-11 | Disposition: A | Discharge status: Home / Self Care | Payer: Medicaid Other | Source: Ambulatory Visit | Attending: Cardiology | Admitting: Cardiology

## 2020-03-11 VITALS — BP 160/100 | HR 137 | Ht 67.0 in | Wt 222.0 lb

## 2020-03-11 DIAGNOSIS — I77810 Thoracic aortic ectasia: Secondary | ICD-10-CM

## 2020-03-11 DIAGNOSIS — Z20822 Contact with and (suspected) exposure to covid-19: Secondary | ICD-10-CM | POA: Insufficient documentation

## 2020-03-11 DIAGNOSIS — Z01812 Encounter for preprocedural laboratory examination: Secondary | ICD-10-CM | POA: Insufficient documentation

## 2020-03-11 DIAGNOSIS — I4819 Other persistent atrial fibrillation: Secondary | ICD-10-CM

## 2020-03-11 DIAGNOSIS — R0601 Orthopnea: Secondary | ICD-10-CM

## 2020-03-11 DIAGNOSIS — I10 Essential (primary) hypertension: Secondary | ICD-10-CM

## 2020-03-11 MED ORDER — DILTIAZEM HCL ER COATED BEADS 360 MG PO CP24: 360.0000 mg | ORAL_CAPSULE | Freq: Every day | ORAL | 5 refills | Status: AC

## 2020-03-11 MED ORDER — TORSEMIDE 20 MG PO TABS: 20.0000 mg | ORAL_TABLET | Freq: Every day | ORAL | 5 refills | Status: AC

## 2020-03-11 NOTE — Progress Notes (Signed)
Cardiology Office Note:    Date:  03/11/2020   ID:  CHE RACHAL, DOB 06-12-1985, MRN [id number]  PCP:  [name], MD  Cardiologist:  [name], MD  Electrophysiologist:  None   Referring MD: [name], MD   Chief Complaint  Patient presents with  . Other    AFIB. Meds reviewed verbally with pt.    History of Present Illness:    Jeremiah Keith is a 34 y.o. male with a hx of CKD, hypertension, substance abuse/history of cocaine use, current smoker who presents for follow-up. Patient was seen last month by myself in the hospital with palpitations. EKG showed A. fib with RVR, heart rate 145. Managed with IV diltiazem, eventually switched to p.o diltiazem. Eliquis started. Also takes Coreg.  Patient states having difficulty breathing, also short of breath the past couple of days.  Also endorses orthopnea.  Heart rates have been difficult to control, diltiazem was increased to 300 mg after last visit.  He states getting short of breath when he lays down.  He is working on getting [employer].  Was declined by making a dollar over the cutoff.  He is compliant with his medications, has not missed a dose of Eliquis over the past month.  Prior notes Echo 01/2020, normal EF, EF 55 to 60%, mild LVH. Mild aortic root dilatation 43 mm.  Past Medical History:  Diagnosis Date  . Aortic root dilatation ([place])    a. 01/2020 Echo: Ao root 4.3cm.  . CKD (chronic kidney disease)   . Depression    after death of family members  . History of chicken pox   . History of echocardiogram    a. 01/2020 Echo: EF 55-60%. No rwma. Mild LVH. Triv AI. Ao root 4.3 cm.  . History of stress test    a. 10/2017 MV: EF 40-45%, diff HK. No scar/ischemia-->intermediate risk in setting of depressed EF however, EF 55-60% by echo-->med managed.  . Hypertension   . PAF (paroxysmal atrial fibrillation) (HCC)    a. CHA2DS2VASc = 1-->eliquis.  . Substance abuse [facility])    h/o  cocaine abuse    Past Surgical History:  Procedure Laterality Date  . TONSILLECTOMY AND ADENOIDECTOMY  1990    Current Medications: Current Meds  Medication Sig  . apixaban (ELIQUIS) 5 MG TABS tablet Take 1 tablet (5 mg total) by mouth 2 (two) times daily.  . carvedilol (COREG) 25 MG tablet Take 1 tablet (25 mg total) by mouth 2 (two) times daily with a meal.  . irbesartan (AVAPRO) 150 MG tablet Take 1 tablet (150 mg total) by mouth daily.  . [DISCONTINUED] diltiazem (CARDIZEM CD) 300 MG 24 hr capsule Take 1 capsule (300 mg total) by mouth daily.     Allergies:   Nsaids and Penicillins   Social History   Socioeconomic History  . Marital status: Married    Spouse name: Not on file  . Number of children: Not on file  . Years of education: 68  . Highest education level: Not on file  Occupational History  . Not on file  Tobacco Use  . Smoking status: Former Smoker    Packs/day: 25.00    Types: Cigarettes    Quit date: 02/10/2020    Years since quitting: 0.0  . Smokeless tobacco: Never Used  Substance and Sexual Activity  . Alcohol use: Yes    Alcohol/week: 0.0 standard drinks  . Drug use: Not Currently  . Sexual activity: Not on  file  Other Topics Concern  . Not on file  Social History Narrative   [employer] fan   Married 2016, 2 stepkids, 1 biological son.    Works at [employer]   [employer] grad   Social Determinants of Radio broadcast assistant Strain: Not on file  Food Insecurity: Not on file  Transportation Needs: Not on file  Physical Activity: Not on file  Stress: Not on file  Social Connections: Not on file     Family History: The patient's family history includes Diabetes in his father; Heart disease in his mother; Hypertension in his father; Prostate cancer in his maternal grandfather. There is no history of Colon cancer.  ROS:   Please see the history of present illness.     All other systems reviewed and are negative.  EKGs/Labs/Other Studies Reviewed:     The following studies were reviewed today:   EKG:  EKG is  ordered today.  The ekg ordered today demonstrates A. fib with RVR  Recent Labs: 02/09/2020: ALT 17; Magnesium 2.3; TSH 2.870 02/10/2020: BUN 31; Creatinine, Ser 2.27; Hemoglobin 17.0; Platelets 205; Potassium 3.4; Sodium 138  Recent Lipid Panel    Component Value Date/Time   CHOL 147 02/10/2020 0605   TRIG 85 02/10/2020 0605   HDL 31 (L) 02/10/2020 0605   CHOLHDL 4.7 02/10/2020 0605   VLDL 17 02/10/2020 0605   LDLCALC 99 02/10/2020 0605    Physical Exam:    VS:  BP (!) 160/100 (BP Location: Left Arm, Patient Position: Sitting, Cuff Size: Normal)   Pulse (!) 137   Ht 5\' 7"  (1.702 m)   Wt 222 lb (100.7 kg)   SpO2 98%   BMI 34.77 kg/m     Wt Readings from Last 3 Encounters:  03/11/20 222 lb (100.7 kg)  02/16/20 225 lb (102.1 kg)  02/10/20 218 lb 6.4 oz (99.1 kg)     GEN:  Well nourished, well developed in no acute distress HEENT: Normal NECK: No JVD; No carotid bruits LYMPHATICS: No lymphadenopathy CARDIAC: Irregularly irregular, no murmurs RESPIRATORY:  Clear to auscultation without rales, wheezing or rhonchi  ABDOMEN: Soft, non-tender, distended MUSCULOSKELETAL:  No edema; No deformity  SKIN: Warm and dry NEUROLOGIC:  Alert and oriented x 3 PSYCHIATRIC:  Normal affect   ASSESSMENT:    1. Persistent atrial fibrillation ([place])   2. Primary hypertension   3. Aortic root dilation (HCC)   4. Orthopnea    PLAN:    In order of problems listed above:  1. Persistent atrial fibrillation, RVR.  CHA2DS2-VASc score 1.  Increase Cardizem to 360 mg daily.  Continue Eliquis 5 mg twice daily.  Continue Coreg.  We'll plan for DC cardioversion in 2 days.  Patient has been on uninterrupted Eliquis x4 weeks. 2. BP elevated, increase diltiazem as above.  Continue Coreg, irbesartan. 3. Mild aortic root dilatation.  Serial monitoring with echocardiogram 4. Orthopnea, likely diastolic dysfunction from persistent A. Fib  and hypertension.  Start torsemide 20 mg daily.    Follow-up in 1 week.   This note was generated in part or whole with voice recognition software. Voice recognition is usually quite accurate but there are transcription errors that can and very often do occur. I apologize for any typographical errors that were not detected and corrected.  Medication Adjustments/Labs and Tests Ordered: Current medicines are reviewed at length with the patient today.  Concerns regarding medicines are outlined above.  Orders Placed This Encounter  Procedures  .  CBC  . Basic metabolic panel  . EKG 12-Lead   Meds ordered this encounter  Medications  . diltiazem (CARDIZEM CD) 360 MG 24 hr capsule    Sig: Take 1 capsule (360 mg total) by mouth daily.    Dispense:  30 capsule    Refill:  5  . torsemide (DEMADEX) 20 MG tablet    Sig: Take 1 tablet (20 mg total) by mouth daily.    Dispense:  30 tablet    Refill:  5    Patient Instructions  Medication Instructions:  Your physician has recommended you make the following change in your medication:   1.  START taking Torsemide 20 MG: Take one tab by mouth daily.  2.  INCREASE your Cardizem CD (diltiazem) 360 MG: Take one tab by mouth daily.  *If you need a refill on your cardiac medications before your next appointment, please call your pharmacy*   Lab Work:  BMP, CBC drawn today.  If you have labs (blood work) drawn today and your tests are completely normal, you will receive your results only by: [place] [facility] MyChart Message (if you have MyChart) OR . A paper copy in the mail If you have any lab test that is abnormal or we need to change your treatment, we will call you to review the results.   Testing/Procedures:  You are scheduled for a Cardioversion on __Wed 12/29______________ with Dr.__Agbor-Etang_______ Please arrive at the [facility] at ____0700_____ a.m. on the day of your procedure.  1) DIET INSTRUCTIONS:  Nothing to eat or drink  after midnight except your medications with a small sip of water.  2) Medications:  YOU MAY TAKE ALL of your remaining medications with a small amount of water.  3) Must have a responsible person to drive you home.  4) Bring a current list of your medications and current insurance cards.    If you have any questions after you get home, please call the office at [phone]   Follow-Up: At [facility], you and your health needs are our priority.  As part of our continuing mission to provide you with exceptional heart care, we have created designated Provider Care Teams.  These Care Teams include your primary Cardiologist (physician) and Advanced Practice Providers (APPs -  Physician Assistants and Nurse Practitioners) who all work together to provide you with the care you need, when you need it.  We recommend signing up for the patient portal called "MyChart".  Sign up information is provided on this After Visit Summary.  MyChart is used to connect with patients for Virtual Visits (Telemedicine).  Patients are able to view lab/test results, encounter notes, upcoming appointments, etc.  Non-urgent messages can be sent to your provider as well.   To learn more about what you can do with MyChart, go to [email or web address].    Your next appointment:    Tues Jan 4th 10:20   The format for your next appointment:   In Person  Provider:   [name], MD   Other Instructions  Samples of this drug ( Eliquis 2.5 MG) were given to the patient, quantity 4, Lot Number [id number] exp: 1/23     Signed, [name], MD  03/11/2020 12:56 PM    [place]

## 2020-03-11 NOTE — Patient Instructions (Signed)
Medication Instructions:  Your physician has recommended you make the following change in your medication:   1.  START taking Torsemide 20 MG: Take one tab by mouth daily.  2.  INCREASE your Cardizem CD (diltiazem) 360 MG: Take one tab by mouth daily.  *If you need a refill on your cardiac medications before your next appointment, please call your pharmacy*   Lab Work:  BMP, CBC drawn today.  If you have labs (blood work) drawn today and your tests are completely normal, you will receive your results only by:  [place] (if you have MyChart) OR  A paper copy in the mail If you have any lab test that is abnormal or we need to change your treatment, we will call you to review the results.   Testing/Procedures:  You are scheduled for a Cardioversion on __Wed 12/29______________ with Dr.__Agbor-Etang_______ Please arrive at the [place] [facility] at ____0700_____ a.m. on the day of your procedure.  1) DIET INSTRUCTIONS:  Nothing to eat or drink after midnight except your medications with a small sip of water.  2) Medications:  YOU MAY TAKE ALL of your remaining medications with a small amount of water.  3) Must have a responsible person to drive you home.  4) Bring a current list of your medications and current insurance cards.    If you have any questions after you get home, please call the office at [phone]   Follow-Up: At [facility], you and your health needs are our priority.  As part of our continuing mission to provide you with exceptional heart care, we have created designated Provider Care Teams.  These Care Teams include your primary Cardiologist (physician) and Advanced Practice Providers (APPs -  Physician Assistants and Nurse Practitioners) who all work together to provide you with the care you need, when you need it.  We recommend signing up for the patient portal called "MyChart".  Sign up information is provided on this After Visit Summary.  MyChart  is used to connect with patients for Virtual Visits (Telemedicine).  Patients are able to view lab/test results, encounter notes, upcoming appointments, etc.  Non-urgent messages can be sent to your provider as well.   To learn more about what you can do with MyChart, go to [email or web address].    Your next appointment:    Tues Jan 4th 10:20   The format for your next appointment:   In Person  Provider:   [name], MD   Other Instructions  Samples of this drug ( Eliquis 2.5 MG) were given to the patient, quantity 4, Lot Number [id number] exp: 1/23

## 2020-03-12 LAB — SARS CORONAVIRUS 2 (TAT 6-24 HRS): SARS Coronavirus 2: NEGATIVE

## 2020-03-12 LAB — CBC
Hematocrit: 48.6 % (ref 37.5–51.0)
Hemoglobin: 17.1 g/dL (ref 13.0–17.7)
MCH: 31 pg (ref 26.6–33.0)
MCHC: 35.2 g/dL (ref 31.5–35.7)
MCV: 88 fL (ref 79–97)
Platelets: 221 10*3/uL (ref 150–450)
RBC: 5.52 x10E6/uL (ref 4.14–5.80)
RDW: 12.2 % (ref 11.6–15.4)
WBC: 9.4 10*3/uL (ref 3.4–10.8)

## 2020-03-12 LAB — BASIC METABOLIC PANEL
BUN/Creatinine Ratio: 10 (ref 9–20)
BUN: 23 mg/dL — ABNORMAL HIGH (ref 6–20)
CO2: 19 mmol/L — ABNORMAL LOW (ref 20–29)
Calcium: 9.5 mg/dL (ref 8.7–10.2)
Chloride: 107 mmol/L — ABNORMAL HIGH (ref 96–106)
Creatinine, Ser: 2.41 mg/dL — ABNORMAL HIGH (ref 0.76–1.27)
GFR calc Af Amer: 39 mL/min/{1.73_m2} — ABNORMAL LOW (ref >59)
GFR calc non Af Amer: 34 mL/min/{1.73_m2} — ABNORMAL LOW (ref >59)
Glucose: 115 mg/dL — ABNORMAL HIGH (ref 65–99)
Potassium: 4.4 mmol/L (ref 3.5–5.2)
Sodium: 141 mmol/L (ref 134–144)

## 2020-03-13 ENCOUNTER — Inpatient Hospital Stay (HOSPITAL_COMMUNITY)
Admit: 2020-03-13 | Discharge: 2020-03-13 | Disposition: A | Discharge status: Home / Self Care | Payer: Medicaid Other | Attending: Cardiology | Admitting: Cardiology

## 2020-03-13 ENCOUNTER — Emergency Department: Payer: Medicaid Other | Admitting: Anesthesiology

## 2020-03-13 ENCOUNTER — Inpatient Hospital Stay: Payer: Medicaid Other

## 2020-03-13 ENCOUNTER — Inpatient Hospital Stay
Admission: EM | Admit: 2020-03-13 | Discharge: 2020-04-16 | DRG: 308 | Disposition: E | Payer: Medicaid Other | Attending: Internal Medicine | Admitting: Internal Medicine

## 2020-03-13 ENCOUNTER — Emergency Department: Payer: Medicaid Other

## 2020-03-13 ENCOUNTER — Ambulatory Visit: Admission: RE | Admit: 2020-03-13 | Payer: Medicaid Other | Source: Home / Self Care | Admitting: Cardiology

## 2020-03-13 ENCOUNTER — Other Ambulatory Visit: Payer: Self-pay

## 2020-03-13 ENCOUNTER — Encounter: Admission: EM | Disposition: E | Payer: Self-pay | Source: Home / Self Care | Attending: Pulmonary Disease

## 2020-03-13 DIAGNOSIS — Z20822 Contact with and (suspected) exposure to covid-19: Secondary | ICD-10-CM | POA: Diagnosis present

## 2020-03-13 DIAGNOSIS — R0989 Other specified symptoms and signs involving the circulatory and respiratory systems: Secondary | ICD-10-CM | POA: Diagnosis present

## 2020-03-13 DIAGNOSIS — I5021 Acute systolic (congestive) heart failure: Secondary | ICD-10-CM | POA: Diagnosis not present

## 2020-03-13 DIAGNOSIS — Z9114 Patient's other noncompliance with medication regimen: Secondary | ICD-10-CM

## 2020-03-13 DIAGNOSIS — I5082 Biventricular heart failure: Secondary | ICD-10-CM

## 2020-03-13 DIAGNOSIS — I4901 Ventricular fibrillation: Secondary | ICD-10-CM | POA: Diagnosis present

## 2020-03-13 DIAGNOSIS — N170 Acute kidney failure with tubular necrosis: Secondary | ICD-10-CM | POA: Diagnosis present

## 2020-03-13 DIAGNOSIS — Z833 Family history of diabetes mellitus: Secondary | ICD-10-CM | POA: Diagnosis not present

## 2020-03-13 DIAGNOSIS — G931 Anoxic brain damage, not elsewhere classified: Secondary | ICD-10-CM | POA: Diagnosis present

## 2020-03-13 DIAGNOSIS — J9601 Acute respiratory failure with hypoxia: Secondary | ICD-10-CM

## 2020-03-13 DIAGNOSIS — X58XXXA Exposure to other specified factors, initial encounter: Secondary | ICD-10-CM | POA: Diagnosis present

## 2020-03-13 DIAGNOSIS — N280 Ischemia and infarction of kidney: Secondary | ICD-10-CM | POA: Diagnosis present

## 2020-03-13 DIAGNOSIS — Z7901 Long term (current) use of anticoagulants: Secondary | ICD-10-CM | POA: Diagnosis not present

## 2020-03-13 DIAGNOSIS — Z452 Encounter for adjustment and management of vascular access device: Secondary | ICD-10-CM

## 2020-03-13 DIAGNOSIS — E872 Acidosis: Secondary | ICD-10-CM | POA: Diagnosis present

## 2020-03-13 DIAGNOSIS — I255 Ischemic cardiomyopathy: Secondary | ICD-10-CM | POA: Diagnosis present

## 2020-03-13 DIAGNOSIS — Z87891 Personal history of nicotine dependence: Secondary | ICD-10-CM

## 2020-03-13 DIAGNOSIS — J9600 Acute respiratory failure, unspecified whether with hypoxia or hypercapnia: Secondary | ICD-10-CM

## 2020-03-13 DIAGNOSIS — I499 Cardiac arrhythmia, unspecified: Secondary | ICD-10-CM

## 2020-03-13 DIAGNOSIS — S0081XA Abrasion of other part of head, initial encounter: Secondary | ICD-10-CM | POA: Diagnosis present

## 2020-03-13 DIAGNOSIS — I6340 Cerebral infarction due to embolism of unspecified cerebral artery: Secondary | ICD-10-CM | POA: Diagnosis present

## 2020-03-13 DIAGNOSIS — I480 Paroxysmal atrial fibrillation: Secondary | ICD-10-CM

## 2020-03-13 DIAGNOSIS — I462 Cardiac arrest due to underlying cardiac condition: Secondary | ICD-10-CM

## 2020-03-13 DIAGNOSIS — I639 Cerebral infarction, unspecified: Secondary | ICD-10-CM

## 2020-03-13 DIAGNOSIS — I509 Heart failure, unspecified: Secondary | ICD-10-CM

## 2020-03-13 DIAGNOSIS — T17908A Unspecified foreign body in respiratory tract, part unspecified causing other injury, initial encounter: Secondary | ICD-10-CM

## 2020-03-13 DIAGNOSIS — I6310 Cerebral infarction due to embolism of unspecified precerebral artery: Secondary | ICD-10-CM | POA: Diagnosis not present

## 2020-03-13 DIAGNOSIS — Z7189 Other specified counseling: Secondary | ICD-10-CM | POA: Diagnosis not present

## 2020-03-13 DIAGNOSIS — Z8249 Family history of ischemic heart disease and other diseases of the circulatory system: Secondary | ICD-10-CM | POA: Diagnosis not present

## 2020-03-13 DIAGNOSIS — R570 Cardiogenic shock: Secondary | ICD-10-CM | POA: Diagnosis present

## 2020-03-13 DIAGNOSIS — Z515 Encounter for palliative care: Secondary | ICD-10-CM | POA: Diagnosis not present

## 2020-03-13 DIAGNOSIS — I4819 Other persistent atrial fibrillation: Secondary | ICD-10-CM | POA: Diagnosis not present

## 2020-03-13 DIAGNOSIS — Z01818 Encounter for other preprocedural examination: Secondary | ICD-10-CM

## 2020-03-13 DIAGNOSIS — H5581 Saccadic eye movements: Secondary | ICD-10-CM | POA: Diagnosis present

## 2020-03-13 DIAGNOSIS — I130 Hypertensive heart and chronic kidney disease with heart failure and stage 1 through stage 4 chronic kidney disease, or unspecified chronic kidney disease: Secondary | ICD-10-CM | POA: Diagnosis present

## 2020-03-13 DIAGNOSIS — I5084 End stage heart failure: Secondary | ICD-10-CM | POA: Diagnosis present

## 2020-03-13 DIAGNOSIS — Z66 Do not resuscitate: Secondary | ICD-10-CM | POA: Diagnosis not present

## 2020-03-13 DIAGNOSIS — I6349 Cerebral infarction due to embolism of other cerebral artery: Secondary | ICD-10-CM | POA: Diagnosis not present

## 2020-03-13 DIAGNOSIS — I472 Ventricular tachycardia: Secondary | ICD-10-CM | POA: Diagnosis not present

## 2020-03-13 DIAGNOSIS — J9602 Acute respiratory failure with hypercapnia: Secondary | ICD-10-CM | POA: Diagnosis present

## 2020-03-13 DIAGNOSIS — I469 Cardiac arrest, cause unspecified: Secondary | ICD-10-CM

## 2020-03-13 DIAGNOSIS — I5023 Acute on chronic systolic (congestive) heart failure: Secondary | ICD-10-CM | POA: Diagnosis present

## 2020-03-13 DIAGNOSIS — N1832 Chronic kidney disease, stage 3b: Secondary | ICD-10-CM | POA: Diagnosis present

## 2020-03-13 DIAGNOSIS — I6312 Cerebral infarction due to embolism of basilar artery: Secondary | ICD-10-CM | POA: Diagnosis not present

## 2020-03-13 DIAGNOSIS — R739 Hyperglycemia, unspecified: Secondary | ICD-10-CM | POA: Diagnosis present

## 2020-03-13 DIAGNOSIS — R1110 Vomiting, unspecified: Secondary | ICD-10-CM | POA: Diagnosis not present

## 2020-03-13 DIAGNOSIS — Z4659 Encounter for fitting and adjustment of other gastrointestinal appliance and device: Secondary | ICD-10-CM

## 2020-03-13 DIAGNOSIS — R0603 Acute respiratory distress: Secondary | ICD-10-CM

## 2020-03-13 DIAGNOSIS — Z79899 Other long term (current) drug therapy: Secondary | ICD-10-CM | POA: Diagnosis not present

## 2020-03-13 DIAGNOSIS — E875 Hyperkalemia: Secondary | ICD-10-CM | POA: Diagnosis present

## 2020-03-13 DIAGNOSIS — I4510 Unspecified right bundle-branch block: Secondary | ICD-10-CM | POA: Diagnosis present

## 2020-03-13 DIAGNOSIS — I77810 Thoracic aortic ectasia: Secondary | ICD-10-CM | POA: Diagnosis present

## 2020-03-13 DIAGNOSIS — I452 Bifascicular block: Secondary | ICD-10-CM | POA: Diagnosis present

## 2020-03-13 HISTORY — PX: CARDIOVERSION: SHX1299

## 2020-03-13 LAB — BLOOD GAS, ARTERIAL
Acid-base deficit: 7.7 mmol/L — ABNORMAL HIGH (ref 0.0–2.0)
Acid-base deficit: 9.4 mmol/L — ABNORMAL HIGH (ref 0.0–2.0)
Bicarbonate: 19.7 mmol/L — ABNORMAL LOW (ref 20.0–28.0)
Bicarbonate: 18.4 mmol/L — ABNORMAL LOW (ref 20.0–28.0)
FIO2: 0.7
FIO2: 0.4
MECHVT: 450 mL
O2 Saturation: 99.6 %
O2 Saturation: 98.4 %
PEEP: 5 cmH2O
PEEP: 8 cmH2O
Patient temperature: 37
Patient temperature: 37
RATE: 16 resp/min
RATE: 15 resp/min
pCO2 arterial: 46 mmHg (ref 32.0–48.0)
pCO2 arterial: 46 mmHg (ref 32.0–48.0)
pH, Arterial: 7.24 — ABNORMAL LOW (ref 7.350–7.450)
pH, Arterial: 7.21 — ABNORMAL LOW (ref 7.350–7.450)
pO2, Arterial: 199 mmHg — ABNORMAL HIGH (ref 83.0–108.0)
pO2, Arterial: 132 mmHg — ABNORMAL HIGH (ref 83.0–108.0)

## 2020-03-13 LAB — URINALYSIS, COMPLETE (UACMP) WITH MICROSCOPIC
Bilirubin Urine: NEGATIVE
Glucose, UA: 50 mg/dL — AB
Ketones, ur: NEGATIVE mg/dL
Leukocytes,Ua: NEGATIVE
Nitrite: NEGATIVE
Protein, ur: >=300 mg/dL — AB
Specific Gravity, Urine: 1.014 (ref 1.005–1.030)
pH: 5 (ref 5.0–8.0)

## 2020-03-13 LAB — BASIC METABOLIC PANEL
Anion gap: 8 (ref 5–15)
Anion gap: 13 (ref 5–15)
BUN: 32 mg/dL — ABNORMAL HIGH (ref 6–20)
BUN: 32 mg/dL — ABNORMAL HIGH (ref 6–20)
CO2: 23 mmol/L (ref 22–32)
CO2: 20 mmol/L — ABNORMAL LOW (ref 22–32)
Calcium: 8.2 mg/dL — ABNORMAL LOW (ref 8.9–10.3)
Calcium: 8.7 mg/dL — ABNORMAL LOW (ref 8.9–10.3)
Chloride: 110 mmol/L (ref 98–111)
Chloride: 109 mmol/L (ref 98–111)
Creatinine, Ser: 2.94 mg/dL — ABNORMAL HIGH (ref 0.61–1.24)
Creatinine, Ser: 2.93 mg/dL — ABNORMAL HIGH (ref 0.61–1.24)
GFR, Estimated: 28 mL/min — ABNORMAL LOW (ref >60)
GFR, Estimated: 28 mL/min — ABNORMAL LOW (ref >60)
Glucose, Bld: 145 mg/dL — ABNORMAL HIGH (ref 70–99)
Glucose, Bld: 117 mg/dL — ABNORMAL HIGH (ref 70–99)
Potassium: 5.5 mmol/L — ABNORMAL HIGH (ref 3.5–5.1)
Potassium: 4.9 mmol/L (ref 3.5–5.1)
Sodium: 141 mmol/L (ref 135–145)
Sodium: 142 mmol/L (ref 135–145)

## 2020-03-13 LAB — ECHOCARDIOGRAM COMPLETE
AR max vel: 3.3 cm2
AV Area VTI: 2.44 cm2
AV Area mean vel: 3.25 cm2
AV Mean grad: 1 mmHg
AV Peak grad: 2.5 mmHg
Ao pk vel: 0.8 m/s
Area-P 1/2: 3.26 cm2
Calc EF: 35.9 %
S' Lateral: 5.11 cm
Single Plane A2C EF: 32.4 %
Single Plane A4C EF: 40 %

## 2020-03-13 LAB — COMPREHENSIVE METABOLIC PANEL
ALT: 19 U/L (ref 0–44)
ALT: 23 U/L (ref 0–44)
AST: 28 U/L (ref 15–41)
AST: 22 U/L (ref 15–41)
Albumin: 3.9 g/dL (ref 3.5–5.0)
Albumin: 3.3 g/dL — ABNORMAL LOW (ref 3.5–5.0)
Alkaline Phosphatase: 72 U/L (ref 38–126)
Alkaline Phosphatase: 65 U/L (ref 38–126)
Anion gap: 13 (ref 5–15)
Anion gap: 11 (ref 5–15)
BUN: 29 mg/dL — ABNORMAL HIGH (ref 6–20)
BUN: 33 mg/dL — ABNORMAL HIGH (ref 6–20)
CO2: 21 mmol/L — ABNORMAL LOW (ref 22–32)
CO2: 21 mmol/L — ABNORMAL LOW (ref 22–32)
Calcium: 8.8 mg/dL — ABNORMAL LOW (ref 8.9–10.3)
Calcium: 7.9 mg/dL — ABNORMAL LOW (ref 8.9–10.3)
Chloride: 103 mmol/L (ref 98–111)
Chloride: 109 mmol/L (ref 98–111)
Creatinine, Ser: 2.96 mg/dL — ABNORMAL HIGH (ref 0.61–1.24)
Creatinine, Ser: 2.92 mg/dL — ABNORMAL HIGH (ref 0.61–1.24)
GFR, Estimated: 27 mL/min — ABNORMAL LOW (ref >60)
GFR, Estimated: 28 mL/min — ABNORMAL LOW (ref >60)
Glucose, Bld: 263 mg/dL — ABNORMAL HIGH (ref 70–99)
Glucose, Bld: 217 mg/dL — ABNORMAL HIGH (ref 70–99)
Potassium: 3.5 mmol/L (ref 3.5–5.1)
Potassium: 4.1 mmol/L (ref 3.5–5.1)
Sodium: 137 mmol/L (ref 135–145)
Sodium: 141 mmol/L (ref 135–145)
Total Bilirubin: 1 mg/dL (ref 0.3–1.2)
Total Bilirubin: 0.6 mg/dL (ref 0.3–1.2)
Total Protein: 6.8 g/dL (ref 6.5–8.1)
Total Protein: 5.7 g/dL — ABNORMAL LOW (ref 6.5–8.1)

## 2020-03-13 LAB — APTT
aPTT: 30 seconds (ref 24–36)
aPTT: 33 seconds (ref 24–36)
aPTT: 85 seconds — ABNORMAL HIGH (ref 24–36)

## 2020-03-13 LAB — CBC WITH DIFFERENTIAL/PLATELET
Abs Immature Granulocytes: 0.23 10*3/uL — ABNORMAL HIGH (ref 0.00–0.07)
Basophils Absolute: 0.1 10*3/uL (ref 0.0–0.1)
Basophils Relative: 0 %
Eosinophils Absolute: 0.2 10*3/uL (ref 0.0–0.5)
Eosinophils Relative: 1 %
HCT: 47.2 % (ref 39.0–52.0)
Hemoglobin: 15.9 g/dL (ref 13.0–17.0)
Immature Granulocytes: 1 %
Lymphocytes Relative: 34 %
Lymphs Abs: 5.9 10*3/uL — ABNORMAL HIGH (ref 0.7–4.0)
MCH: 30.9 pg (ref 26.0–34.0)
MCHC: 33.7 g/dL (ref 30.0–36.0)
MCV: 91.7 fL (ref 80.0–100.0)
Monocytes Absolute: 0.7 10*3/uL (ref 0.1–1.0)
Monocytes Relative: 4 %
Neutro Abs: 10.2 10*3/uL — ABNORMAL HIGH (ref 1.7–7.7)
Neutrophils Relative %: 60 %
Platelets: 254 10*3/uL (ref 150–400)
RBC: 5.15 MIL/uL (ref 4.22–5.81)
RDW: 12.7 % (ref 11.5–15.5)
Smear Review: NORMAL
WBC: 17.2 10*3/uL — ABNORMAL HIGH (ref 4.0–10.5)
nRBC: 0 % (ref 0.0–0.2)

## 2020-03-13 LAB — BRAIN NATRIURETIC PEPTIDE: B Natriuretic Peptide: 229.5 pg/mL — ABNORMAL HIGH (ref 0.0–100.0)

## 2020-03-13 LAB — RESP PANEL BY RT-PCR (FLU A&B, COVID) ARPGX2
Influenza A by PCR: NEGATIVE
Influenza B by PCR: NEGATIVE
SARS Coronavirus 2 by RT PCR: NEGATIVE

## 2020-03-13 LAB — GLUCOSE, CAPILLARY
Glucose-Capillary: 147 mg/dL — ABNORMAL HIGH (ref 70–99)
Glucose-Capillary: 138 mg/dL — ABNORMAL HIGH (ref 70–99)
Glucose-Capillary: 101 mg/dL — ABNORMAL HIGH (ref 70–99)
Glucose-Capillary: 112 mg/dL — ABNORMAL HIGH (ref 70–99)

## 2020-03-13 LAB — URINE DRUG SCREEN, QUALITATIVE (ARMC ONLY)
Amphetamines, Ur Screen: NOT DETECTED
Barbiturates, Ur Screen: NOT DETECTED
Benzodiazepine, Ur Scrn: NOT DETECTED
Cannabinoid 50 Ng, Ur ~~LOC~~: POSITIVE — AB
Cocaine Metabolite,Ur ~~LOC~~: NOT DETECTED
MDMA (Ecstasy)Ur Screen: NOT DETECTED
Methadone Scn, Ur: NOT DETECTED
Opiate, Ur Screen: NOT DETECTED
Phencyclidine (PCP) Ur S: NOT DETECTED
Tricyclic, Ur Screen: NOT DETECTED

## 2020-03-13 LAB — LACTIC ACID, PLASMA
Lactic Acid, Venous: 5.7 mmol/L (ref 0.5–1.9)
Lactic Acid, Venous: 1.5 mmol/L (ref 0.5–1.9)

## 2020-03-13 LAB — PROTIME-INR
INR: 1.2 (ref 0.8–1.2)
INR: 1.2 (ref 0.8–1.2)
Prothrombin Time: 14.7 seconds (ref 11.4–15.2)
Prothrombin Time: 15 seconds (ref 11.4–15.2)

## 2020-03-13 LAB — HEMOGLOBIN A1C
Hgb A1c MFr Bld: 5 % (ref 4.8–5.6)
Mean Plasma Glucose: 96.8 mg/dL

## 2020-03-13 LAB — TROPONIN I (HIGH SENSITIVITY)
Troponin I (High Sensitivity): 22 ng/L — ABNORMAL HIGH (ref <18)
Troponin I (High Sensitivity): 47 ng/L — ABNORMAL HIGH (ref <18)
Troponin I (High Sensitivity): 106 ng/L (ref <18)
Troponin I (High Sensitivity): 126 ng/L (ref <18)
Troponin I (High Sensitivity): 136 ng/L

## 2020-03-13 LAB — HEPARIN LEVEL (UNFRACTIONATED): Heparin Unfractionated: 1.79 IU/mL — ABNORMAL HIGH (ref 0.30–0.70)

## 2020-03-13 LAB — MRSA PCR SCREENING: MRSA by PCR: NEGATIVE

## 2020-03-13 LAB — PROCALCITONIN: Procalcitonin: <0.1 ng/mL

## 2020-03-13 SURGERY — CARDIOVERSION
Anesthesia: General

## 2020-03-13 MED ORDER — CHLORHEXIDINE GLUCONATE CLOTH 2 % EX PADS
6.0000 | MEDICATED_PAD | Freq: Every day | CUTANEOUS | Status: DC
  Administered 2020-03-16 – 2020-03-17 (×2): 6 via TOPICAL

## 2020-03-13 MED ORDER — ORAL CARE MOUTH RINSE
15.0000 mL | OROMUCOSAL | Status: DC
  Administered 2020-03-13 – 2020-03-19 (×55): 15 mL via OROMUCOSAL

## 2020-03-13 MED ORDER — SUCCINYLCHOLINE CHLORIDE 20 MG/ML IJ SOLN
INTRAMUSCULAR | Status: AC | PRN
  Administered 2020-03-13: 100 mg via INTRAVENOUS

## 2020-03-13 MED ORDER — AMIODARONE HCL IN DEXTROSE 360-4.14 MG/200ML-% IV SOLN
30.0000 mg/h | INTRAVENOUS | Status: DC
  Administered 2020-03-13 – 2020-03-19 (×10): 30 mg/h via INTRAVENOUS
  Filled 2020-03-13 (×10): qty 200

## 2020-03-13 MED ORDER — ATROPINE SULFATE 1 MG/10ML IJ SOSY: 1.0000 mg | PREFILLED_SYRINGE | INTRAMUSCULAR | Status: DC | PRN

## 2020-03-13 MED ORDER — FENTANYL 2500MCG IN NS 250ML (10MCG/ML) PREMIX INFUSION
50.0000 ug/h | INTRAVENOUS | Status: DC
  Filled 2020-03-13 (×2): qty 250

## 2020-03-13 MED ORDER — EPINEPHRINE 1 MG/10ML IJ SOSY
PREFILLED_SYRINGE | INTRAMUSCULAR | Status: AC
  Administered 2020-03-13: 16:00:00 0.1 mg via INTRAVENOUS
  Filled 2020-03-13: qty 10

## 2020-03-13 MED ORDER — EPINEPHRINE HCL 5 MG/250ML IV SOLN IN NS
0.5000 ug/min | INTRAVENOUS | Status: DC
  Administered 2020-03-13: 0.5 ug/min via INTRAVENOUS
  Administered 2020-03-14: 7 ug/min via INTRAVENOUS
  Filled 2020-03-13 (×2): qty 250

## 2020-03-13 MED ORDER — INSULIN ASPART 100 UNIT/ML ~~LOC~~ SOLN
10.0000 [IU] | Freq: Once | SUBCUTANEOUS | Status: AC
  Administered 2020-03-13: 16:00:00 10 [IU] via INTRAVENOUS
  Filled 2020-03-13: qty 1
  Filled 2020-03-13 (×2): qty 0.1

## 2020-03-13 MED ORDER — PERFLUTREN LIPID MICROSPHERE
1.0000 mL | INTRAVENOUS | Status: AC | PRN
  Administered 2020-03-13: 12:00:00 2 mL via INTRAVENOUS
  Filled 2020-03-13: qty 10

## 2020-03-13 MED ORDER — SODIUM CHLORIDE 0.9 % IV SOLN: INTRAVENOUS | Status: DC

## 2020-03-13 MED ORDER — PATIROMER SORBITEX CALCIUM 8.4 G PO PACK
16.8000 g | PACK | Freq: Once | ORAL | Status: AC
  Administered 2020-03-13: 17:00:00 16.8 g via ORAL
  Filled 2020-03-13: qty 2

## 2020-03-13 MED ORDER — FAMOTIDINE IN NACL 20-0.9 MG/50ML-% IV SOLN
20.0000 mg | Freq: Two times a day (BID) | INTRAVENOUS | Status: DC
  Administered 2020-03-13 – 2020-03-20 (×14): 20 mg via INTRAVENOUS
  Filled 2020-03-13 (×19): qty 50

## 2020-03-13 MED ORDER — MIDAZOLAM HCL 2 MG/2ML IJ SOLN: 2.0000 mg | Freq: Once | INTRAMUSCULAR | Status: AC

## 2020-03-13 MED ORDER — DEXTROSE 50 % IV SOLN
1.0000 | Freq: Once | INTRAVENOUS | Status: AC
  Administered 2020-03-13: 16:00:00 50 mL via INTRAVENOUS
  Filled 2020-03-13: qty 50

## 2020-03-13 MED ORDER — HEPARIN BOLUS VIA INFUSION
2000.0000 [IU] | Freq: Once | INTRAVENOUS | Status: AC
  Administered 2020-03-13: 15:00:00 2000 [IU] via INTRAVENOUS
  Filled 2020-03-13: qty 2000

## 2020-03-13 MED ORDER — MIDAZOLAM 50MG/50ML (1MG/ML) PREMIX INFUSION
2.0000 mg/h | INTRAVENOUS | Status: DC
  Filled 2020-03-13 (×2): qty 50

## 2020-03-13 MED ORDER — FENTANYL CITRATE (PF) 100 MCG/2ML IJ SOLN
50.0000 ug | Freq: Once | INTRAMUSCULAR | Status: DC
  Filled 2020-03-13: qty 2

## 2020-03-13 MED ORDER — ETOMIDATE 2 MG/ML IV SOLN
INTRAVENOUS | Status: AC | PRN
  Administered 2020-03-13: 20 mg via INTRAVENOUS

## 2020-03-13 MED ORDER — ATROPINE SULFATE 1 MG/10ML IJ SOSY: 1.0000 mg | PREFILLED_SYRINGE | Freq: Once | INTRAMUSCULAR | Status: AC

## 2020-03-13 MED ORDER — LORAZEPAM 2 MG/ML IJ SOLN
2.0000 mg | Freq: Once | INTRAMUSCULAR | Status: AC
  Administered 2020-03-13: 07:00:00 2 mg via INTRAVENOUS

## 2020-03-13 MED ORDER — ASPIRIN 300 MG RE SUPP: 300.0000 mg | RECTAL | Status: AC

## 2020-03-13 MED ORDER — SODIUM CHLORIDE 0.9 % IV SOLN
1000.0000 mL | Freq: Once | INTRAVENOUS | Status: AC
  Administered 2020-03-13: 1000 mL via INTRAVENOUS

## 2020-03-13 MED ORDER — CISATRACURIUM BOLUS VIA INFUSION
0.0500 mg/kg | INTRAVENOUS | Status: DC | PRN
  Filled 2020-03-13: qty 5

## 2020-03-13 MED ORDER — CHLORHEXIDINE GLUCONATE 0.12% ORAL RINSE (MEDLINE KIT)
15.0000 mL | Freq: Two times a day (BID) | OROMUCOSAL | Status: DC
  Administered 2020-03-13 – 2020-03-19 (×12): 15 mL via OROMUCOSAL

## 2020-03-13 MED ORDER — CALCIUM GLUCONATE-NACL 1-0.675 GM/50ML-% IV SOLN
1.0000 g | Freq: Once | INTRAVENOUS | Status: AC
  Administered 2020-03-13: 17:00:00 1000 mg via INTRAVENOUS
  Filled 2020-03-13: qty 50

## 2020-03-13 MED ORDER — NOREPINEPHRINE 16 MG/250ML-% IV SOLN
0.0000 ug/min | INTRAVENOUS | Status: DC
  Filled 2020-03-13: qty 250

## 2020-03-13 MED ORDER — CISATRACURIUM BOLUS VIA INFUSION
0.1000 mg/kg | Freq: Once | INTRAVENOUS | Status: AC
  Administered 2020-03-14: 11:00:00 10.1 mg via INTRAVENOUS
  Filled 2020-03-13: qty 11

## 2020-03-13 MED ORDER — SODIUM CHLORIDE 0.9 % IV SOLN
1.0000 ug/kg/min | INTRAVENOUS | Status: DC
  Administered 2020-03-14: 11:00:00 1 ug/kg/min via INTRAVENOUS
  Filled 2020-03-13: qty 20

## 2020-03-13 MED ORDER — FENTANYL 2500MCG IN NS 250ML (10MCG/ML) PREMIX INFUSION
100.0000 ug/h | INTRAVENOUS | Status: DC
  Administered 2020-03-13: 23:00:00 150 ug/h via INTRAVENOUS
  Administered 2020-03-13: 10:00:00 100 ug/h via INTRAVENOUS
  Administered 2020-03-14: 150 ug/h via INTRAVENOUS
  Administered 2020-03-15: 200 ug/h via INTRAVENOUS
  Administered 2020-03-15 – 2020-03-19 (×6): 150 ug/h via INTRAVENOUS
  Administered 2020-03-19 – 2020-03-20 (×3): 300 ug/h via INTRAVENOUS
  Filled 2020-03-13 (×13): qty 250

## 2020-03-13 MED ORDER — ATROPINE SULFATE 1 MG/10ML IJ SOSY
PREFILLED_SYRINGE | INTRAMUSCULAR | Status: AC
  Administered 2020-03-13: 16:00:00 1 mg via INTRAVENOUS
  Filled 2020-03-13: qty 10

## 2020-03-13 MED ORDER — PROPOFOL 1000 MG/100ML IV EMUL
5.0000 ug/kg/min | INTRAVENOUS | Status: DC
  Administered 2020-03-13: 20:00:00 40 ug/kg/min via INTRAVENOUS
  Administered 2020-03-13: 5 ug/kg/min via INTRAVENOUS
  Administered 2020-03-13: 17:00:00 50 ug/kg/min via INTRAVENOUS
  Administered 2020-03-13: 14:00:00 70 ug/kg/min via INTRAVENOUS
  Administered 2020-03-14: 20:00:00 50 ug/kg/min via INTRAVENOUS
  Administered 2020-03-14 (×2): 40 ug/kg/min via INTRAVENOUS
  Administered 2020-03-14 (×2): 50 ug/kg/min via INTRAVENOUS
  Administered 2020-03-14 (×2): 40 ug/kg/min via INTRAVENOUS
  Administered 2020-03-15: 30 ug/kg/min via INTRAVENOUS
  Administered 2020-03-15 (×5): 50 ug/kg/min via INTRAVENOUS
  Administered 2020-03-16 (×2): 40 ug/kg/min via INTRAVENOUS
  Administered 2020-03-16 (×3): 30 ug/kg/min via INTRAVENOUS
  Administered 2020-03-17 (×3): 40 ug/kg/min via INTRAVENOUS
  Filled 2020-03-13 (×12): qty 100
  Filled 2020-03-13: qty 200
  Filled 2020-03-13 (×15): qty 100

## 2020-03-13 MED ORDER — HEPARIN (PORCINE) 25000 UT/250ML-% IV SOLN
1150.0000 [IU]/h | INTRAVENOUS | Status: DC
  Administered 2020-03-13 – 2020-03-15 (×3): 1150 [IU]/h via INTRAVENOUS
  Filled 2020-03-13 (×3): qty 250

## 2020-03-13 MED ORDER — FENTANYL BOLUS VIA INFUSION
50.0000 ug | INTRAVENOUS | Status: DC | PRN
  Administered 2020-03-17: 25 ug via INTRAVENOUS
  Administered 2020-03-18 – 2020-03-19 (×2): 50 ug via INTRAVENOUS
  Filled 2020-03-13: qty 50

## 2020-03-13 MED ORDER — ARTIFICIAL TEARS OPHTHALMIC OINT
1.0000 "application " | TOPICAL_OINTMENT | Freq: Three times a day (TID) | OPHTHALMIC | Status: DC
  Administered 2020-03-13 – 2020-03-19 (×17): 1 via OPHTHALMIC
  Filled 2020-03-13 (×3): qty 3.5

## 2020-03-13 MED ORDER — ATROPINE SULFATE 1 MG/10ML IJ SOSY
PREFILLED_SYRINGE | INTRAMUSCULAR | Status: AC
  Filled 2020-03-13: qty 10

## 2020-03-13 MED ORDER — NOREPINEPHRINE 4 MG/250ML-% IV SOLN
0.0000 ug/min | INTRAVENOUS | Status: DC
  Administered 2020-03-13: 13:00:00 2 ug/min via INTRAVENOUS
  Filled 2020-03-13: qty 250

## 2020-03-13 MED ORDER — SODIUM BICARBONATE 8.4 % IV SOLN
50.0000 meq | Freq: Once | INTRAVENOUS | Status: AC
  Administered 2020-03-13: 16:00:00 50 meq via INTRAVENOUS
  Filled 2020-03-13: qty 50

## 2020-03-13 MED ORDER — FENTANYL BOLUS VIA INFUSION
50.0000 ug | INTRAVENOUS | Status: DC | PRN
  Administered 2020-03-18 – 2020-03-19 (×3): 50 ug via INTRAVENOUS
  Filled 2020-03-13: qty 50

## 2020-03-13 MED ORDER — AMIODARONE HCL IN DEXTROSE 360-4.14 MG/200ML-% IV SOLN
60.0000 mg/h | INTRAVENOUS | Status: DC
  Administered 2020-03-13: 08:00:00 60 mg/h via INTRAVENOUS
  Filled 2020-03-13 (×2): qty 200

## 2020-03-13 MED ORDER — EPINEPHRINE 1 MG/10ML IJ SOSY: 0.1000 mg | PREFILLED_SYRINGE | Freq: Once | INTRAMUSCULAR | Status: AC

## 2020-03-13 MED ORDER — PROPOFOL 1000 MG/100ML IV EMUL
25.0000 ug/kg/min | INTRAVENOUS | Status: DC
  Administered 2020-03-13: 10:00:00 25 ug/kg/min via INTRAVENOUS

## 2020-03-13 MED ORDER — INSULIN ASPART 100 UNIT/ML ~~LOC~~ SOLN
0.0000 [IU] | SUBCUTANEOUS | Status: DC
  Administered 2020-03-16: 1 [IU] via SUBCUTANEOUS
  Filled 2020-03-13 (×2): qty 1

## 2020-03-13 MED ORDER — MIDAZOLAM HCL 2 MG/2ML IJ SOLN
INTRAMUSCULAR | Status: AC
  Administered 2020-03-13: 13:00:00 2 mg via INTRAVENOUS
  Filled 2020-03-13: qty 2

## 2020-03-13 MED ORDER — FENTANYL CITRATE (PF) 100 MCG/2ML IJ SOLN
100.0000 ug | Freq: Once | INTRAMUSCULAR | Status: DC
Start: 2020-03-13 — End: 2020-03-14

## 2020-03-13 NOTE — Progress Notes (Signed)
Pt in critical condition, TTM initiated, patient shivering and coughing delaying cooling; increased sedation to calm patient. 36 degree temperature reached at 1300 per [facility] device.  Pt had central line and arterial line placed in the left femoral area.  During the procedure, pt became bradycardic in the 30s, provider aware, verbal order to DC amiodarone drip.  Administered one time doses of Epinephrine and Atropine, started epinephrine drip, patient remains bradycardic (HR high 40s-50s), blood pressure within goal for TTM (MAP > 80).  Pt retains cough and gag reflex, no movement to painful stimulus.  Family has been updated on condition, pt belongings at bedside.  Will continue to monitor.

## 2020-03-13 NOTE — Consult Note (Signed)
MEDICATION RELATED CONSULT NOTE - INITIAL   Pharmacy Consult for Amiodarone  Indication: drug interaction monitoring  Allergies  Allergen Reactions  . Nsaids     Chronic kidney disease  . Penicillins Rash    Has patient had a PCN reaction causing immediate rash, facial/tongue/throat swelling, SOB or lightheadedness with hypotension: Yes Has patient had a PCN reaction causing severe rash involving mucus membranes or skin necrosis: No Has patient had a PCN reaction that required hospitalization: No Has patient had a PCN reaction occurring within the last 10 years: No If all of the above answers are "NO", then may proceed with Cephalosporin use.      Labs: Recent Labs    03/11/20 0948 03/12/2020 0700  WBC 9.4 17.2*  HGB 17.1 15.9  HCT 48.6 47.2  PLT 221 254  APTT  --  30  CREATININE 2.41* 2.96*  ALBUMIN  --  3.9  PROT  --  6.8  AST  --  28  ALT  --  19  ALKPHOS  --  72  BILITOT  --  1.0   Estimated Creatinine Clearance: 39.4 mL/min (A) (by C-G formula based on SCr of 2.96 mg/dL (H)).   Medical History: Past Medical History:  Diagnosis Date  . Aortic root dilatation ([place])    a. 01/2020 Echo: Ao root 4.3cm.  . CKD (chronic kidney disease)   . Depression    after death of family members  . History of chicken pox   . History of echocardiogram    a. 01/2020 Echo: EF 55-60%. No rwma. Mild LVH. Triv AI. Ao root 4.3 cm.  . History of stress test    a. 10/2017 MV: EF 40-45%, diff HK. No scar/ischemia-->intermediate risk in setting of depressed EF however, EF 55-60% by echo-->med managed.  . Hypertension   . PAF (paroxysmal atrial fibrillation) (HCC)    a. CHA2DS2VASc = 1-->eliquis.  . Substance abuse ([place])    h/o cocaine abuse    Medications:  (Not in a hospital admission)  Infusions:  . sodium chloride 75 mL/hr at 03/07/2020 0724  . amiodarone 60 mg/hr (03/08/2020 0737)   Followed by  . amiodarone    . propofol (DIPRIVAN) infusion 45 mcg/kg/min (03/02/2020 0740)     Assessment: No current drug-drug interactions identified for in-patient medications   Plan:   Will continue to follow and monitor for drug interactions as additional therapy is added  Dorothe Pea, PharmD, BCPS 02/27/2020,7:49 AM

## 2020-03-13 NOTE — ED Notes (Signed)
ICU RN and EDT at bedside to tranport pt. ICU RN given versed that was not initiated and levophed. Pt on Propofol at 42mcg/kg/min, Fentanyl at 136mcg/hr, Amiodarone at 60mg /hr, and NS at 68ml/hr.

## 2020-03-13 NOTE — ED Triage Notes (Signed)
Pt to ED via [facility], pt brought to EMS by father, pt was unresponsive on arrival to ems in cardiac arrest. Pt was shocked 3 times (v fib) and give 1 epi and 3 narcan. Pt arrives ST and going into vtach w/ a pulse, amio drip infusing from ems. Per ems pt had apt today to get cardioverted for beign in a weird rhythm per father

## 2020-03-13 NOTE — Progress Notes (Signed)
*  PRELIMINARY RESULTS* Echocardiogram 2D Echocardiogram has been performed.  Jeremiah Keith 03/12/2020, 11:50 AM

## 2020-03-13 NOTE — Procedures (Signed)
Patient Name: Jeremiah Keith  MRN: [id number]  Epilepsy Attending: Lora Havens  Referring Physician/Provider: Dr [name], NP Date: 03/12/2020 Duration: 25.05 mins  Patient history: 34yo M s/p cardiac arrest. EEG to evaluate for seizure  Level of alertness: comatose  AEDs during EEG study: Propofol  Technical aspects: This EEG study was done with scalp electrodes positioned according to the 10-20 International system of electrode placement. Electrical activity was acquired at a sampling rate of 500Hz  and reviewed with a high frequency filter of 70Hz  and a low frequency filter of 1Hz . EEG data were recorded continuously and digitally stored.   Description: EEG showed burst suppression with bursts of generalized low amplitude 3-5hz  theta-delta slowing lasting 3-5seconds and generalized suppression lasting 4-6 seconds. EEG was not reactive to tactile stimulation. Hyperventilation and photic stimulation were not performed.     ABNORMALITY -Burst suppression, generalized  IMPRESSION: This study is suggestive of profoud diffuse encephalopathy, nonspecific etiology but likely related to sedation, anoxic/hypoxic brain injury. No seizures or epileptiform discharges were seen throughout the recording.  Rosaly Labarbera Barbra Sarks

## 2020-03-13 NOTE — Progress Notes (Signed)
[place] for heparin Indication: chest pain/ACS  Allergies  Allergen Reactions  . Nsaids     Chronic kidney disease  . Penicillins Rash    Has patient had a PCN reaction causing immediate rash, facial/tongue/throat swelling, SOB or lightheadedness with hypotension: Yes Has patient had a PCN reaction causing severe rash involving mucus membranes or skin necrosis: No Has patient had a PCN reaction that required hospitalization: No Has patient had a PCN reaction occurring within the last 10 years: No If all of the above answers are "NO", then may proceed with Cephalosporin use.     Patient Measurements:   Heparin Dosing Weight: 88 kg  Vital Signs: Temp: 98.3 F (36.8 C) (12/29 1000) BP: 92/74 (12/29 1000) Pulse Rate: 57 (12/29 1000)  Labs: Recent Labs    03/11/20 0948 03/08/2020 0700  HGB 17.1 15.9  HCT 48.6 47.2  PLT 221 254  APTT  --  30  LABPROT  --  14.7  INR  --  1.2  CREATININE 2.41* 2.96*  TROPONINIHS  --  22*    Estimated Creatinine Clearance: 39.4 mL/min (A) (by C-G formula based on SCr of 2.96 mg/dL (H)).   Medical History: Past Medical History:  Diagnosis Date  . Aortic root dilatation ([place])    a. 01/2020 Echo: Ao root 4.3cm.  . CKD (chronic kidney disease)   . Depression    after death of family members  . History of chicken pox   . History of echocardiogram    a. 01/2020 Echo: EF 55-60%. No rwma. Mild LVH. Triv AI. Ao root 4.3 cm.  . History of stress test    a. 10/2017 MV: EF 40-45%, diff HK. No scar/ischemia-->intermediate risk in setting of depressed EF however, EF 55-60% by echo-->med managed.  . Hypertension   . PAF (paroxysmal atrial fibrillation) (HCC)    a. CHA2DS2VASc = 1-->eliquis.  . Substance abuse [facility])    h/o cocaine abuse     Assessment: 34 year old male s/p cardiac arrest with ROSC requiring multiple defibrillations and epinephrine. Patient was on his way to an appointment for  cardioversion for persistent afib. Patient does take Eliquis PTA. Due to nature of presentation, post-arrest and now on [place], difficult to assess last dose of Eliquis PTA. Likely this morning or last night.   Goal of Therapy:  Heparin level 0.3-0.7 units/ml aPTT 66-102 seconds Monitor platelets by anticoagulation protocol: Yes   Plan:  With concern for possible ACS/STEMI precipitating arrest, will defer any delay in initiating heparin. Will give small bolus 2000 units followed by heparin drip at 1150 units/hr. Check aPTT at 1800. CBC and HL daily. Baseline HL has been ordered as add on to previous labs drawn this morning.  Tawnya Crook, PharmD 02/14/2020,10:10 AM

## 2020-03-13 NOTE — Progress Notes (Signed)
eeg done °

## 2020-03-13 NOTE — ED Notes (Signed)
Pt mother and father at bedside

## 2020-03-13 NOTE — Progress Notes (Signed)
Attempt made to place Left IJ CVC.  Successful at cannulization of left IJ (as verified with [place]) and positive for slow steady trickle of dark blood.  Initially was able to pass guidewire, but then met resistance at the last 10-15 cm.  Tried to thread  Catheter over guidewire, but again met resistance with approximately 10 cm left on catheter.  As soon as resistance met, Attempt aborted (no forceful advancement of catheter).  Pressure held at the site for approximately 10 minutes as pt on outpatient Eliquis.  Vital signs remain unchanged.    Will obtain CXR to assess for PTX.  Given inability to thread catheter, will place central line in left femoral vein.     [name], AGACNP-BC [facility] Pager: [phone]

## 2020-03-13 NOTE — ED Provider Notes (Signed)
[facility] Emergency Department Provider Note   ____________________________________________    I have reviewed the triage vital signs and the nursing notes.   HISTORY  Chief Complaint Cardiac Arrest  Patient is unresponsive   HPI Jeremiah Keith is a 34 y.o. male with a history of CKD, paroxysmal atrial fibrillation who was apparently scheduled to have cardioversion today, has been on Eliquis for 4 weeks, was in the car with father.  Father noted that the patient became unresponsive and apparently pulled into an EMS station.  They started CPR, received defibrillation x3 for reported V. fib arrest with return of pulses.  Past Medical History:  Diagnosis Date  . Aortic root dilatation ([place])    a. 01/2020 Echo: Ao root 4.3cm.  . CKD (chronic kidney disease)   . Depression    after death of family members  . History of chicken pox   . History of echocardiogram    a. 01/2020 Echo: EF 55-60%. No rwma. Mild LVH. Triv AI. Ao root 4.3 cm.  . History of stress test    a. 10/2017 MV: EF 40-45%, diff HK. No scar/ischemia-->intermediate risk in setting of depressed EF however, EF 55-60% by echo-->med managed.  . Hypertension   . PAF (paroxysmal atrial fibrillation) (HCC)    a. CHA2DS2VASc = 1-->eliquis.  . Substance abuse [facility])    h/o cocaine abuse    Patient Active Problem List   Diagnosis Date Noted  . Cardiac arrest ([place]) 02/21/2020  . New onset atrial fibrillation ([place]) 02/09/2020  . Non compliance w medication regimen 02/09/2020  . Atrial fibrillation with rapid ventricular response ([place])   . CKD (chronic kidney disease) 09/04/2018  . AKI (acute kidney injury) ([place])   . Demand ischemia ([place])   . Hypertensive emergency 11/01/2017  . Acute pulmonary edema (HCC)   . Sciatica 05/05/2015  . History of pneumonia 06/13/2014  . HTN (hypertension) 06/13/2014    Past Surgical History:  Procedure Laterality Date  . TONSILLECTOMY AND  ADENOIDECTOMY  1990    Prior to Admission medications   Medication Sig Start Date End Date Taking? Authorizing Provider  apixaban (ELIQUIS) 5 MG TABS tablet Take 1 tablet (5 mg total) by mouth 2 (two) times daily. 02/10/20   Nita Sells, MD  carvedilol (COREG) 25 MG tablet Take 1 tablet (25 mg total) by mouth 2 (two) times daily with a meal. 02/10/20   Nita Sells, MD  diltiazem (CARDIZEM CD) 360 MG 24 hr capsule Take 1 capsule (360 mg total) by mouth daily. 03/11/20   [name], MD  irbesartan (AVAPRO) 150 MG tablet Take 1 tablet (150 mg total) by mouth daily. 02/10/20   Nita Sells, MD  torsemide (DEMADEX) 20 MG tablet Take 1 tablet (20 mg total) by mouth daily. 03/11/20 06/09/20  [name], MD     Allergies Nsaids and Penicillins  Family History  Problem Relation Age of Onset  . Heart disease Mother   . Diabetes Father   . Hypertension Father   . Prostate cancer Maternal Grandfather        possible prostate cancer  . Colon cancer Neg Hx     Social History Social History   Tobacco Use  . Smoking status: Former Smoker    Packs/day: 25.00    Types: Cigarettes    Quit date: 02/10/2020    Years since quitting: 0.0  . Smokeless tobacco: Never Used  Substance Use Topics  . Alcohol use: Yes    Alcohol/week:  0.0 standard drinks  . Drug use: Not Currently    Review of Systems unavailable    ____________________________________________   PHYSICAL EXAM:  VITAL SIGNS: ED Triage Vitals  Enc Vitals Group     BP 03/05/2020 0701 (!) 148/126     Pulse Rate 03/14/2020 0657 (!) 108     Resp 02/21/2020 0713 (!) 32     Temp --      Temp src --      SpO2 03/14/2020 0657 100 %     Weight --      Height --      Head Circumference --      Peak Flow --      Pain Score --      Pain Loc --      Pain Edu? --      Excl. in [place]? --     Constitutional: Unresponsive, vomitus around the face and mouth Eyes: Conjunctivae are normal.  Head:  Small abrasion to the left forehead Nose: No swelling or epistaxis Mouth/Throat: Vomitus in the mouth  Cardiovascular: Tachycardia, regular rhythm. Grossly normal heart sounds.  Good peripheral circulation. Respiratory: Tachypnea, significant retractions, diffuse rales Gastrointestinal: Soft, no significant distention Genitourinary: Unremarkable Musculoskeletal: No lower extremity tenderness nor edema.  Neurologic: Unresponsive, GCS 3 Skin:  Skin is warm, dry No rash noted.   ____________________________________________   LABS (all labs ordered are listed, but only abnormal results are displayed)  Labs Reviewed  LACTIC ACID, PLASMA - Abnormal; Notable for the following components:      Result Value   Lactic Acid, Venous 5.7 (*)    All other components within normal limits  COMPREHENSIVE METABOLIC PANEL - Abnormal; Notable for the following components:   CO2 21 (*)    Glucose, Bld 263 (*)    BUN 29 (*)    Creatinine, Ser 2.96 (*)    Calcium 8.8 (*)    GFR, Estimated 27 (*)    All other components within normal limits  CBC WITH DIFFERENTIAL/PLATELET - Abnormal; Notable for the following components:   WBC 17.2 (*)    Neutro Abs 10.2 (*)    Lymphs Abs 5.9 (*)    Abs Immature Granulocytes 0.23 (*)    All other components within normal limits  URINALYSIS, COMPLETE (UACMP) WITH MICROSCOPIC - Abnormal; Notable for the following components:   Color, Urine YELLOW (*)    APPearance CLOUDY (*)    Glucose, UA 50 (*)    Hgb urine dipstick SMALL (*)    Protein, ur >=300 (*)    Bacteria, UA RARE (*)    All other components within normal limits  BRAIN NATRIURETIC PEPTIDE - Abnormal; Notable for the following components:   B Natriuretic Peptide 229.5 (*)    All other components within normal limits  BLOOD GAS, ARTERIAL - Abnormal; Notable for the following components:   pH, Arterial 7.24 (*)    pO2, Arterial 199 (*)    Bicarbonate 19.7 (*)    Acid-base deficit 7.7 (*)    All other  components within normal limits  TROPONIN I (HIGH SENSITIVITY) - Abnormal; Notable for the following components:   Troponin I (High Sensitivity) 22 (*)    All other components within normal limits  RESP PANEL BY RT-PCR (FLU A&B, COVID) ARPGX2  URINE CULTURE  CULTURE, BLOOD (ROUTINE X 2)  CULTURE, BLOOD (ROUTINE X 2)  CULTURE, RESPIRATORY  LACTIC ACID, PLASMA  PROTIME-INR  APTT  URINE DRUG SCREEN, QUALITATIVE ([facility] ONLY)  BASIC METABOLIC  PANEL  BASIC METABOLIC PANEL  PROTIME-INR  APTT  BLOOD GAS, ARTERIAL  CBG MONITORING, ED  TROPONIN I (HIGH SENSITIVITY)  TROPONIN I (HIGH SENSITIVITY)   ____________________________________________  EKG  ED ECG REPORT I, Lavonia Drafts, the attending physician, personally viewed and interpreted this ECG.  Date: 02/24/2020  Rhythm: normal sinus rhythm QRS Axis: normal Intervals: Right bundle branch block  ST/T Wave abnormalities: normal Narrative Interpretation: no evidence of acute ischemia  ____________________________________________  RADIOLOGY  Chest x-ray viewed by me Abdomen x-ray reviewed by me ____________________________________________   PROCEDURES  Procedure(s) performed: yes  Procedure Name: Intubation Date/Time: 02/16/2020 7:45 AM Performed by: Lavonia Drafts, MD Pre-anesthesia Checklist: Patient identified, Patient being monitored, Emergency Drugs available, Timeout performed and Suction available Oxygen Delivery Method: Non-rebreather mask Preoxygenation: Pre-oxygenation with 100% oxygen Induction Type: Rapid sequence Ventilation: Mask ventilation without difficulty Laryngoscope Size: Glidescope and 4 Grade View: Grade II Tube size: 7.5 mm Number of attempts: 1 Placement Confirmation: ETT inserted through vocal cords under direct vision,  CO2 detector and Breath sounds checked- equal and bilateral Secured at: 24 cm Tube secured with: ETT holder        Critical Care performed: yes  CRITICAL  CARE Performed by: Lavonia Drafts   Total critical care time: 40  minutes  Critical care time was exclusive of separately billable procedures and treating other patients.  Critical care was necessary to treat or prevent imminent or life-threatening deterioration.  Critical care was time spent personally by me on the following activities: development of treatment plan with patient and/or surrogate as well as nursing, discussions with consultants, evaluation of patient's response to treatment, examination of patient, obtaining history from patient or surrogate, ordering and performing treatments and interventions, ordering and review of laboratory studies, ordering and review of radiographic studies, pulse oximetry and re-evaluation of patient's condition.  ____________________________________________   INITIAL IMPRESSION / ASSESSMENT AND PLAN / ED COURSE  Pertinent labs & imaging results that were available during my care of the patient were reviewed by me and considered in my medical decision making (see chart for details).  Patient presents unresponsive after cardiac arrest.  Reported V. fib arrest with shock x3.  ROSC.    Review of medical records demonstrates the patient was admitted in November for paroxysmal atrial fibrillation, follow-up with cardiology, will has been Eliquis for 4 weeks, was scheduled for cardioversion today.  Was in route to cardioversion with father when he became unresponsive.  CPR started rapidly  Intubated upon arrival, propofol gtt.  Discussed with Dr. Garen Lah of cardiology who recommends continuing amiodarone drip which was started by EMS.  Given V. fib arrest with minimal downtime and young age have started therapeutic hypothermia.  Have consulted ICU.  Lactic acid elevated at 5.7, likely related to cardiac arrest, not sepsis.  CT head is reassuring  Repeat lactic acid is normal. Chest x-ray reviewed by me, no infiltrate or effusion  discussed  with intensive care physician who will admit      ____________________________________________   FINAL CLINICAL IMPRESSION(S) / ED DIAGNOSES  Final diagnoses:  Cardiac arrhythmia, unspecified cardiac arrhythmia type  Cardiac arrest [facility])  Respiratory distress        Note:  This document was prepared using Dragon voice recognition software and may include unintentional dictation errors.   Lavonia Drafts, MD 02/16/2020 1006

## 2020-03-13 NOTE — ED Notes (Signed)
Icing initiated in ED at this time.

## 2020-03-13 NOTE — Procedures (Signed)
Central Venous Catheter Insertion Procedure Note  CHRISTEN BEDOYA  [id number]  [birth year]  Date:03/05/2020  Time:2:40 PM   Provider Performing:Hadi Dubin D Dewaine Conger   Procedure: Insertion of Non-tunneled Central Venous [phone]) with US guidance ([id number])   Indication(s) Medication administration and Difficult access  Consent Risks of the procedure as well as the alternatives and risks of each were explained to the patient and/or caregiver.  Consent for the procedure was obtained and is signed in the bedside chart  Anesthesia Topical only with 1% lidocaine   Timeout Verified patient identification, verified procedure, site/side was marked, verified correct patient position, special equipment/implants available, medications/allergies/relevant history reviewed, required imaging and test results available.  Sterile Technique Maximal sterile technique including full sterile barrier drape, hand hygiene, sterile gown, sterile gloves, mask, hair covering, sterile ultrasound probe cover (if used).  Procedure Description Area of catheter insertion was cleaned with chlorhexidine and draped in sterile fashion.  With real-time ultrasound guidance a central venous catheter was placed into the left femoral vein. Nonpulsatile blood flow and easy flushing noted in all ports.  The catheter was sutured in place and sterile dressing applied.  Complications/Tolerance None; patient tolerated the procedure well. Chest X-ray is ordered to verify placement for internal jugular or subclavian cannulation.   Chest x-ray is not ordered for femoral cannulation.  EBL Minimal  Specimen(s) None   Line was secured at the 20 cm mark.  BIOPATCH applied to the insertion site.    [name], AGACNP-BC [facility] Pager: [phone]

## 2020-03-13 NOTE — Consult Note (Addendum)
Cardiology Consultation:   Patient ID: [name] MRN: [id number]; DOB: 1985/11/01  Admit date: 03/07/2020 Date of Consult: 03/12/2020  Primary Care Provider: [name], MD [facility] Cardiologist: [name], MD  Centre Electrophysiologist:  None    Patient Profile:   Jeremiah Keith is a 34 y.o. male with a hx of CKD, hypertension, current smoker, persistent A. fib who is being seen today for the evaluation of cardiac arrest at the request of Dr. Corky Downs.  History of Present Illness:   Jeremiah Keith is a 34 year old gentleman with history of CKD, hypertension, persistent A. fib, current smoker presenting to the ED after a cardiac arrest.  Patient recently diagnosed with atrial fibrillation with rapid ventricular response.  He was taking Eliquis.  A DC cardioversion was being planned today.  While patient was en route to the hospital with his father, he became unconscious.  EMS was called, upon arrival, initial EKG/rhythm strip showed V. fib.  ACLS including CPR initiated.  Patient was defibrillated at 300 J with achievement of ROSC.  EKG on admission showed sinus rhythm, bifascicular block.  Troponins were 22, 47.  Currently obtaining echocardiogram in ICU.  Patient started on amiodarone drip.  Hypothermia protocol initiated.  Estimated time of collapse to CPR 4 minutes.  ROSC time 6 minutes 43 seconds.   Past Medical History:  Diagnosis Date  . Aortic root dilatation ([place])    a. 01/2020 Echo: Ao root 4.3cm.  . CKD (chronic kidney disease)   . Depression    after death of family members  . History of chicken pox   . History of echocardiogram    a. 01/2020 Echo: EF 55-60%. No rwma. Mild LVH. Triv AI. Ao root 4.3 cm.  . History of stress test    a. 10/2017 MV: EF 40-45%, diff HK. No scar/ischemia-->intermediate risk in setting of depressed EF however, EF 55-60% by echo-->med managed.  . Hypertension   . PAF (paroxysmal atrial  fibrillation) (HCC)    a. CHA2DS2VASc = 1-->eliquis.  . Substance abuse [facility])    h/o cocaine abuse    Past Surgical History:  Procedure Laterality Date  . TONSILLECTOMY AND ADENOIDECTOMY  1990     Home Medications:  Prior to Admission medications   Medication Sig Start Date End Date Taking? Authorizing Provider  apixaban (ELIQUIS) 5 MG TABS tablet Take 1 tablet (5 mg total) by mouth 2 (two) times daily. 02/10/20  Yes Nita Sells, MD  carvedilol (COREG) 25 MG tablet Take 1 tablet (25 mg total) by mouth 2 (two) times daily with a meal. 02/10/20  Yes Nita Sells, MD  diltiazem (CARDIZEM CD) 300 MG 24 hr capsule Take 300 mg by mouth daily.   Yes [provider]  irbesartan (AVAPRO) 150 MG tablet Take 1 tablet (150 mg total) by mouth daily. 02/10/20  Yes Nita Sells, MD  diltiazem (CARDIZEM CD) 360 MG 24 hr capsule Take 1 capsule (360 mg total) by mouth daily. 03/11/20   [name], MD  torsemide (DEMADEX) 20 MG tablet Take 1 tablet (20 mg total) by mouth daily. 03/11/20 06/09/20  [name], MD    Inpatient Medications: Scheduled Meds: . artificial tears  1 application Both Eyes M8U  . aspirin  300 mg Rectal NOW  . Chlorhexidine Gluconate Cloth  6 each Topical Q0600  . cisatracurium  0.1 mg/kg Intravenous Once  . fentaNYL (SUBLIMAZE) injection  100 mcg Intravenous Once  . fentaNYL (SUBLIMAZE) injection  50 mcg Intravenous Once  .  heparin  2,000 Units Intravenous Once  . insulin aspart  0-9 Units Subcutaneous Q4H   Continuous Infusions: . sodium chloride 75 mL/hr at 02/18/2020 0724  . amiodarone 60 mg/hr (03/12/2020 0737)   Followed by  . amiodarone    . cisatracurium (NIMBEX) infusion 2 mg/mL    . famotidine (PEPCID) IV    . fentaNYL infusion INTRAVENOUS Stopped (03/03/2020 0951)  . fentaNYL infusion INTRAVENOUS 100 mcg/hr (03/10/2020 1003)  . heparin    . midazolam Stopped (02/21/2020 0950)  . norepinephrine (LEVOPHED) Adult infusion 2  mcg/min (03/03/2020 1245)  . propofol (DIPRIVAN) infusion 65 mcg/kg/min (02/21/2020 0822)  . propofol (DIPRIVAN) infusion 25 mcg/kg/min (03/11/2020 1006)   PRN Meds: cisatracurium **AND** cisatracurium (NIMBEX) infusion 2 mg/mL **AND** cisatracurium, etomidate, fentaNYL, fentaNYL, succinylcholine  Allergies:    Allergies  Allergen Reactions  . Nsaids     Chronic kidney disease  . Penicillins Rash    Has patient had a PCN reaction causing immediate rash, facial/tongue/throat swelling, SOB or lightheadedness with hypotension: Yes Has patient had a PCN reaction causing severe rash involving mucus membranes or skin necrosis: No Has patient had a PCN reaction that required hospitalization: No Has patient had a PCN reaction occurring within the last 10 years: No If all of the above answers are "NO", then may proceed with Cephalosporin use.     Social History:   Social History   Socioeconomic History  . Marital status: Married    Spouse name: Not on file  . Number of children: Not on file  . Years of education: 36  . Highest education level: Not on file  Occupational History  . Not on file  Tobacco Use  . Smoking status: Former Smoker    Packs/day: 25.00    Types: Cigarettes    Quit date: 02/10/2020    Years since quitting: 0.0  . Smokeless tobacco: Never Used  Substance and Sexual Activity  . Alcohol use: Yes    Alcohol/week: 0.0 standard drinks  . Drug use: Not Currently  . Sexual activity: Not on file  Other Topics Concern  . Not on file  Social History Narrative   [employer] fan   Married 2016, 2 stepkids, 1 biological son.    Works at [employer] Strain: Not on file  Food Insecurity: Not on file  Transportation Needs: Not on file  Physical Activity: Not on file  Stress: Not on file  Social Connections: Not on file  Intimate Partner Violence: Not on file    Family History:    Family History  Problem  Relation Age of Onset  . Heart disease Mother   . Diabetes Father   . Hypertension Father   . Prostate cancer Maternal Grandfather        possible prostate cancer  . Colon cancer Neg Hx      ROS:  Please see the history of present illness.   All other ROS reviewed and negative.     Physical Exam/Data:   Vitals:   03/07/2020 1100 02/29/2020 1130 02/19/2020 1200 03/07/2020 1230  BP: (!) 123/109 91/73 94/77 94/77   Pulse: 63  66   Resp: 20 17 20 14   Temp: 98.78 F (37.1 C) 99.14 F (37.3 C) (!) 97.34 F (36.3 C) 98.42 F (36.9 C)  TempSrc:      SpO2: 100%  99%   Weight:       No intake or output  data in the 24 hours ending 03/11/2020 1252 Last 3 Weights 02/21/2020 03/11/2020 02/16/2020  Weight (lbs) 234 lb 2.1 oz 222 lb 225 lb  Weight (kg) 106.2 kg 100.699 kg 102.059 kg     Body mass index is 36.67 kg/m.  General: Intubated, sedated Lymph: no adenopathy Neck: no JVD Endocrine:  No thryomegaly Vascular: No carotid bruits; FA pulses 2+ bilaterally without bruits  Cardiac:  normal S1, S2; RRR; no murmur  Lungs: Vented breath sounds Abd: soft, nontender, no hepatomegaly  Ext: no edema Skin: warm and dry  Neuro: Intubated sedated Psych: Unable to assess  EKG:  The EKG was personally reviewed and demonstrates: Normal sinus rhythm, right bundle branch block, left posterior fascicular block Telemetry:  Telemetry was personally reviewed and demonstrates: Sinus rhythm  Relevant CV Studies: Echocardiogram 03/15/2020 1. Left ventricular ejection fraction, by estimation, is 20 to 25%. The  left ventricle has severely decreased function. The left ventricle  demonstrates global hypokinesis. There is mild left ventricular  hypertrophy. Left ventricular diastolic parameters  are consistent with Grade I diastolic dysfunction (impaired relaxation).  2. Right ventricular systolic function is severely reduced. The right  ventricular size is normal.  3. The mitral valve is normal in  structure. No evidence of mitral valve  regurgitation.  4. The aortic valve is normal in structure. Aortic valve regurgitation is  trivial.  5. Aortic dilatation noted. There is mild dilatation of the aortic root,  measuring 42 mm.   Laboratory Data:  High Sensitivity Troponin:   Recent Labs  Lab 03/07/2020 0700 02/19/2020 0903  TROPONINIHS 22* 47*     Chemistry Recent Labs  Lab 03/11/20 0948 03/08/2020 0700  NA 141 137  K 4.4 3.5  CL 107* 103  CO2 19* 21*  GLUCOSE 115* 263*  BUN 23* 29*  CREATININE 2.41* 2.96*  CALCIUM 9.5 8.8*  GFRNONAA 34* 27*  GFRAA 39*  --   ANIONGAP  --  13    Recent Labs  Lab 02/19/2020 0700  PROT 6.8  ALBUMIN 3.9  AST 28  ALT 19  ALKPHOS 72  BILITOT 1.0   Hematology Recent Labs  Lab 03/11/20 0948 03/14/2020 0700  WBC 9.4 17.2*  RBC 5.52 5.15  HGB 17.1 15.9  HCT 48.6 47.2  MCV 88 91.7  MCH 31.0 30.9  MCHC 35.2 33.7  RDW 12.2 12.7  PLT 221 254   BNP Recent Labs  Lab 02/28/2020 0700  BNP 229.5*    DDimer No results for input(s): DDIMER in the last 168 hours.   Radiology/Studies:  DG Abdomen 1 View  Result Date: 02/17/2020 CLINICAL DATA:  34 year old male status post cardiac arrest and resuscitation. EXAM: ABDOMEN - 1 VIEW COMPARISON:  Portable chest from the same time today and earlier. FINDINGS: Portable AP supine view at 0719 hours. Enteric tube terminates in the stomach with side hole at the level of the gastric fundus. Moderate gas distention of the stomach. Other visible bowel gas pattern appears normal. Mild motion artifact at the lung bases which appear negative. No osseous abnormality identified. IMPRESSION: Enteric tube terminates in the stomach, side hole at the gastric fundus. Moderate gastric distension. Electronically Signed   By: Genevie Ann M.D.   On: 02/17/2020 07:55   CT Head Wo Contrast  Result Date: 02/14/2020 CLINICAL DATA:  Altered mental status. EXAM: CT HEAD WITHOUT CONTRAST TECHNIQUE: Contiguous axial images  were obtained from the base of the skull through the vertex without intravenous contrast. COMPARISON:  CT head 12/08/2015  FINDINGS: Brain: No evidence of acute infarction, hemorrhage, hydrocephalus, extra-axial collection or mass lesion/mass effect. Mild hypodensity in the cerebral white matter right greater than left without mass-effect. Vascular: Negative for hyperdense vessel Skull: Negative Sinuses/Orbits: Paranasal sinuses clear.  Negative orbit Other: None IMPRESSION: Mild hypodensity in the frontal white matter right greater than left, not present in 2017. Nonspecific appearance. Possible chronic ischemia however given the patient's age consider MRI of the brain without and with contrast for further evaluation Electronically Signed   By: Franchot Gallo M.D.   On: 03/07/2020 08:12   DG Chest Port 1 View  Result Date: 02/28/2020 CLINICAL DATA:  Encounter for intubation. EXAM: PORTABLE CHEST 1 VIEW COMPARISON:  Radiographs 03/02/2020 and 02/09/2020.  CT 04/12/2014. FINDINGS: 1051 hours. Tip of the endotracheal tube is just below the thoracic inlet, approximately 6.5 cm above the carina. Enteric tube is in place, not well visualized, but projecting below the diaphragm. The heart size and mediastinal contours are stable. The lungs are clear. There is no pleural effusion or pneumothorax. No acute osseous findings are evident. IMPRESSION: Endotracheal tube tip just below the thoracic inlet. Enteric tube extends below the diaphragm. No acute cardiopulmonary process. Electronically Signed   By: Richardean Sale M.D.   On: 02/25/2020 11:19   DG Chest Port 1 View  Result Date: 03/07/2020 CLINICAL DATA:  34 year old male status post cardiac arrest and resuscitation. Query sepsis. EXAM: PORTABLE CHEST 1 VIEW COMPARISON:  02/09/2020 chest radiographs and earlier. FINDINGS: Portable AP supine view at 0718 hours. Intubated. Endotracheal tube tip in good position between the level the clavicles and carina. Enteric  tube courses to the abdomen, tip not included. Partially visible gas in the stomach. Resuscitation pads project over the lower chest. Somewhat low lung volumes. Mediastinal contours remain within normal limits. Allowing for portable technique the lungs are clear. No osseous abnormality identified. IMPRESSION: 1. ET tube and enteric tube appear satisfactory. 2. Low lung volumes with no acute cardiopulmonary abnormality. Electronically Signed   By: Genevie Ann M.D.   On: 02/27/2020 07:54     Assessment and Plan:   1.  V. fib arrest -Intubated, sedated -Currently undergoing hypothermia protocol -Maintaining blood pressure off pressors. -Continue amiodarone drip per protocol. -Pending clinical course and improvement in neuro function, patient will require a right and left heart cath likely next week. -Also pending neuro recovery, will get EP input regarding ICD consideration/secondary prevention  2.  Cardiomyopathy, biventricular failure, EF 25% -Appears euvolemic -Strict ins and outs, keep patient net even to net negative. -Right and left heart cath as above, pending clinical course -CHF meds pending clinical course  3.  Paroxysmal A. Fib -Currently in sinus rhythm after defibrillation -amio gtt -Start Heparin drip -Was on Eliquis at home.  Signed, [name], MD  03/09/2020 12:52 PM

## 2020-03-13 NOTE — Progress Notes (Signed)
[place] for heparin Indication: chest pain/ACS  Allergies  Allergen Reactions  . Nsaids     Chronic kidney disease  . Penicillins Rash    Has patient had a PCN reaction causing immediate rash, facial/tongue/throat swelling, SOB or lightheadedness with hypotension: Yes Has patient had a PCN reaction causing severe rash involving mucus membranes or skin necrosis: No Has patient had a PCN reaction that required hospitalization: No Has patient had a PCN reaction occurring within the last 10 years: No If all of the above answers are "NO", then may proceed with Cephalosporin use.     Patient Measurements: Weight: 106.2 kg (234 lb 2.1 oz) Heparin Dosing Weight: 88 kg  Vital Signs: Temp: 95.18 F (35.1 C) (12/29 2100) Temp Source: Bladder (12/29 1900) BP: 139/92 (12/29 2100) Pulse Rate: 46 (12/29 2100)  Labs: Recent Labs    03/11/20 0948 03/11/20 0948 03/03/2020 0700 02/18/2020 0903 03/10/2020 1115 02/23/2020 1237 02/21/2020 1708 02/24/2020 2043  HGB 17.1  --  15.9  --   --   --   --   --   HCT 48.6  --  47.2  --   --   --   --   --   PLT 221  --  254  --   --   --   --   --   APTT  --   --  30  --  33  --   --  85*  LABPROT  --   --  14.7  --  15.0  --   --   --   INR  --   --  1.2  --  1.2  --   --   --   HEPARINUNFRC  --   --   --   --  1.79*  --   --   --   CREATININE 2.41*  --  2.96*  --   --  2.94* 2.92*  --   TROPONINIHS  --    < > 22* 47*  --  106* 126*  --    < > = values in this interval not displayed.    Estimated Creatinine Clearance: 41 mL/min (A) (by C-G formula based on SCr of 2.92 mg/dL (H)).   Medical History: Past Medical History:  Diagnosis Date  . Aortic root dilatation ([place])    a. 01/2020 Echo: Ao root 4.3cm.  . CKD (chronic kidney disease)   . Depression    after death of family members  . History of chicken pox   . History of echocardiogram    a. 01/2020 Echo: EF 55-60%. No rwma. Mild LVH. Triv AI. Ao root 4.3  cm.  . History of stress test    a. 10/2017 MV: EF 40-45%, diff HK. No scar/ischemia-->intermediate risk in setting of depressed EF however, EF 55-60% by echo-->med managed.  . Hypertension   . PAF (paroxysmal atrial fibrillation) (HCC)    a. CHA2DS2VASc = 1-->eliquis.  . Substance abuse [facility])    h/o cocaine abuse     Assessment: 34 year old male s/p cardiac arrest with ROSC requiring multiple defibrillations and epinephrine. Patient was on his way to an appointment for cardioversion for persistent afib. Patient does take Eliquis PTA. Due to nature of presentation, post-arrest and now on [place], difficult to assess last dose of Eliquis PTA. Likely this morning or last night.   Goal of Therapy:  Heparin level 0.3-0.7 units/ml aPTT 66-102 seconds Monitor platelets by  anticoagulation protocol: Yes   Plan:  12/29:  APTT @ 2043 = 85 Will continue pt on current rate and recheck aPTT and HL on 12/30 @ 0300.   Marlynn Hinckley D, PharmD 02/15/2020,9:52 PM

## 2020-03-13 NOTE — Progress Notes (Signed)
Pt's ETT was advanced 2 cm and is now 26 at the lip.

## 2020-03-13 NOTE — H&P (Signed)
NAME:  Jeremiah Keith, MRN:  [id number], DOB:  [birth year], LOS: 0 ADMISSION DATE:  03/11/2020, CONSULTATION DATE:  02/25/2020 REFERRING MD:  Dr. Corky Downs, CHIEF COMPLAINT:  Cardiac Arrest   Brief History:  34 y.o. Male with a PMH significant for Paroxsymal Atrial fibrillation, Hypertension, and CKD admitted s/p out of hospital cardiac arrest (V-fib).  Being placed on Targeted Temperature Management @ 36 C. 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  History of Present Illness:  Jeremiah Keith is a 34 year old male with a past medical history significant for paroxysmal atrial fibrillation on Eliquis for 4 weeks, chronic kidney disease, and hypertension who presented to [facility] ED on 02/19/2020 following an out of hospital V. fib cardiac arrest.  Apparently he was scheduled to have a cardioversion done today and in route to the appointment his father noted he became unresponsive in the car where he subsequently pulled into an EMS station.  CPR was started, and he received defibrillation x3 for reported V. fib arrest with ROSC obtained.  Estimated time of collapse to CPR is 4 minutes, with ROSC obtained after 6 minutes 43 seconds.  ED course: Upon arrival to the ED he was emergently intubated.  He remained unresponsive therefore he was placed on targeted temperature management at 36 C.  Cardiology was consulted and recommended placing on amiodarone drip.  Initial work-up shows bicarb 21, BUN 29, creatinine 2.96, glucose 263, BNP 229.5, high-sensitivity troponin 22, lactic acid 5.7, WBC 17.2 with neutrophilia, and procalcitonin is negative.  Chest x-ray is negative for any acute cardiopulmonary process.  His SARS-CoV-2 PCR and influenza PCR both negative.  Urinalysis is not consistent with UTI.  Urine drug screen is positive for cannabinoid.  CT head obtained which showed mild hypodensity in the frontal white matter (right greater than left)  which is nonspecific in appearance, along with possible chronic ischemia.  PCCM is asked to admit the patient to ICU for further work-up and treatment of V. fib cardiac arrest.  Post admission to ICU he is now hypotensive requiring vasopressors.  Echocardiogram performed revealing Cardiomyopathy and biventricular failure with EF of 20 to 25%.  Central venous access and arterial line has been placed.   Past Medical History:  Paroxsymal Atrial Fibrillation Hypertension Aortic Root Dilatation Substance Abuse Chicken Pox  Significant Hospital Events:  12/29: Presented to ED w/ cardiac arrest, intubated, TTM being initiated  Consults:  PCCM  Cardiology Nephrology  Procedures:  12/29: Endotracheal intubation 12/29: Left femoral CVC placed 12/29: Left femoral A-line placed  Significant Diagnostic Tests:  12/29: Chest x-ray>>Portable AP supine view at 0718 hours. Intubated. Endotracheal tube tip in good position between the level the clavicles and carina. Enteric tube courses to the abdomen, tip not included. Partially visible gas in the stomach. Resuscitation pads project over the lower chest. Somewhat low lung volumes. Mediastinal contours remain within normal limits. Allowing for portable technique the lungs are clear. No osseous abnormality identified. 12/29: Abdominal X-ray>>Enteric tube terminates in the stomach, side hole at the gastric fundus. Moderate gastric distension. 12/29: CT Head w/o Contrast>>Mild hypodensity in the frontal white matter right greater than left, not present in 2017. Nonspecific appearance. Possible chronic ischemia however given the patient's age consider MRI of the brain without and with contrast for further evaluation 12/29: 2D Echocardiogram>>1. Left ventricular ejection fraction, by estimation, is 20 to 25%. The  left ventricle has severely decreased function. The left ventricle  demonstrates global hypokinesis. There is mild left ventricular  hypertrophy. Left ventricular diastolic parameters  are consistent with Grade I diastolic dysfunction (impaired relaxation).  2. Right ventricular systolic function is severely reduced. The right  ventricular size is normal.  3. The mitral valve is normal in structure. No evidence of mitral valve  regurgitation.  4. The aortic valve is normal in structure. Aortic valve regurgitation is  trivial.  5. Aortic dilatation noted. There is mild dilatation of the aortic root,  measuring 42 mm.  12/29: EEG>>  Micro Data:  12/29: SARS-CoV-2 PCR>>negative 12/29: Influenza PCR>>negative 12/29: Blood culture x2>> 12/29: Urine>> 12/29: Tracheal aspirate>>  Antimicrobials:  N/A  Interim History / Subjective:  Intubated in ED Targeted Temperature management initiated at 36 C Requiring Levophed and Amiodarone infusion Plan to start Heparin gtt as per discussion with Cardiology  Objective   Blood pressure 93/80, pulse 74, temperature 99.2 F (37.3 C), resp. rate (!) 29, SpO2 99 %.    Vent Mode: AC FiO2 (%):  [50 %-100 %] 50 % Set Rate:  [16 bmp] 16 bmp Vt Set:  [450 mL] 450 mL PEEP:  [5 cmH20] 5 cmH20  No intake or output data in the 24 hours ending 03/04/2020 0953 There were no vitals filed for this visit.  Examination: General: Critically ill appearing male, laying in bed, intubated and sedated, in NAD HENT: Atraumatic, normocephalic, neck supple, no JVD Lungs: Clear breath sounds bilaterally, no wheezing or rales, synchronous with the vent, even Cardiovascular: Bradycardia, regular rhythm, s1s2, no M/R/G Abdomen: Soft, nontender, nondistended, no guarding or rebound tenderness Extremities: Normal bulk and tone, no deformities, no edema Neuro: Unresponsive post cardiac arrest, currently sedated for TTM, pupils PERRL Skin: Cool and dry.  No obvious rashes, lesions, ulcerations  Resolved Hospital Problem list   N/A  Assessment & Plan:   V. Fib Cardiac arrest Cardiogenic  Shock Cardiomyopathy, Biventricular failure (EF 25% on Echo 03/06/2020) Paroxsymal Atrial Fibrillation Hx: HTN -Continuous cardiac monitoring -Maintain MAP >65 -Vasopressors as needed to maintain MAP goal -Trend lactic acid -Trend Troponin -Targeted Temperature Management @ 36 C -Cardiology consulted, appreciate input -Continue Amiodarone gtt and place on Heparin gtt as per discussion with Cardiology -Unable to diurese at this time due to hypotension and AKI -Echo on 03/01/2020 with: 1. Left ventricular ejection fraction, by estimation, is 20 to 25%. The  left ventricle has severely decreased function. The left ventricle  demonstrates global hypokinesis. There is mild left ventricular  hypertrophy. Left ventricular diastolic parameters  are consistent with Grade I diastolic dysfunction (impaired relaxation).  2. Right ventricular systolic function is severely reduced. The right  ventricular size is normal.  3. The mitral valve is normal in structure. No evidence of mitral valve  regurgitation.  4. The aortic valve is normal in structure. Aortic valve regurgitation is  trivial.  5. Aortic dilatation noted. There is mild dilatation of the aortic root,  measuring 42 mm.  -Urine drug screen positive for cannabinoid   Acute Hypoxic Respiratory Failure in the setting of Cardiac Arrest Suspected Aspiration Pneumonitis -Full vent support, vent settings established and reviewed -Wean FiO2 & PEEP as tolerated to maintain O2 sats >92% -Follow intermittent CXR and ABG as needed -VAP Protocol implemented -Spontaneous breathing trials when respiratory parameters met   Leukocytosis Suspected Aspiration Pneumonitis -Monitor fever curve -Trend WBC's & Procalcitonin -Procalcitonin is negative, will hold off on empiric ABX for now -Follow pan cultures as above -CXR without evidence of pneumonia -UA not concerning for UTI -Abdominal exam is benign   AKI superimposed  on CKD Mild  Hyperkalemia -Monitor I&O's / urinary output -Follow BMP -Ensure adequate renal perfusion -Avoid nephrotoxic agents as able -Replace electrolytes as indicated -Nephrology consulted, appreciate input -Will give Ca gluconate, bicarb, insulin, veltassa for hyperkalemia (as pt is now severely bradycardic)   Unresponsive s/p cardiac arrest -Targeted Temperature Management @ 36 C -Maintain RASS -4 while on TTM -Propofol, Fentanyl, and Nimbex if needed to maintain RASS goal and to prevent shivering -Daily wake up assessments once TTM complete -CT Head on 03/06/2020 with mild hypodensity in the frontal white matter, nonspecific in appearance; also with possible chronic ischemia -Will need follow up MRI Brain -Obtain EEG -Consult Neurology when TTM complete   Hyperglycemia -CBG's -Sliding scale insulin -Follow ICU Hypo/hyperglycemia protocol  -Check Hgb A1c   Best practice (evaluated daily)  Diet: NPO Pain/Anxiety/Delirium protocol (if indicated): Fentanyl and Propfol VAP protocol (if indicated): Yes, HOB 30 degrees DVT prophylaxis: Heparin gtt GI prophylaxis: Pepcid IV Glucose control: SSI Mobility: Bedrest Disposition:ICU  Goals of Care:  Last date of multidisciplinary goals of care discussion: 03/06/2020 Family and staff present: Pt's mother present at bedside, updated by myself and Dr. Orpah Melter Summary of discussion: Cardiac arrest, TTM being initiated Follow up goals of care discussion due: 03/14/20 Code Status: Full Code  Labs   CBC: Recent Labs  Lab 03/11/20 0948 03/10/2020 0700  WBC 9.4 17.2*  NEUTROABS  --  10.2*  HGB 17.1 15.9  HCT 48.6 47.2  MCV 88 91.7  PLT 221 378    Basic Metabolic Panel: Recent Labs  Lab 03/11/20 0948 03/08/2020 0700  NA 141 137  K 4.4 3.5  CL 107* 103  CO2 19* 21*  GLUCOSE 115* 263*  BUN 23* 29*  CREATININE 2.41* 2.96*  CALCIUM 9.5 8.8*   GFR: Estimated Creatinine Clearance: 39.4 mL/min (A) (by C-G formula based on SCr of 2.96  mg/dL (H)). Recent Labs  Lab 03/11/20 0948 02/24/2020 0700 03/12/2020 0903  WBC 9.4 17.2*  --   LATICACIDVEN  --  5.7* 1.5    Liver Function Tests: Recent Labs  Lab 03/06/2020 0700  AST 28  ALT 19  ALKPHOS 72  BILITOT 1.0  PROT 6.8  ALBUMIN 3.9   No results for input(s): LIPASE, AMYLASE in the last 168 hours. No results for input(s): AMMONIA in the last 168 hours.  ABG    Component Value Date/Time   PHART 7.24 (L) 02/15/2020 0810   PCO2ART 46 02/17/2020 0810   PO2ART 199 (H) 03/11/2020 0810   HCO3 19.7 (L) 03/04/2020 0810   ACIDBASEDEF 7.7 (H) 03/11/2020 0810   O2SAT 99.6 02/20/2020 0810     Coagulation Profile: Recent Labs  Lab 03/03/2020 0700  INR 1.2    Cardiac Enzymes: No results for input(s): CKTOTAL, CKMB, CKMBINDEX, TROPONINI in the last 168 hours.  HbA1C: No results found for: HGBA1C  CBG: No results for input(s): GLUCAP in the last 168 hours.  Review of Systems:   Unable to assess due to Critical illness, intubation, and sedation  Past Medical History:  He,  has a past medical history of Aortic root dilatation (HCC), CKD (chronic kidney disease), Depression, History of chicken pox, History of echocardiogram, History of stress test, Hypertension, PAF (paroxysmal atrial fibrillation) ([place]), and Substance abuse ([place]).   Surgical History:   Past Surgical History:  Procedure Laterality Date  . TONSILLECTOMY AND ADENOIDECTOMY  1990     Social History:   reports that he quit smoking about 4 weeks ago. His smoking use included  cigarettes. He smoked 25.00 packs per day. He has never used smokeless tobacco. He reports current alcohol use. He reports previous drug use.   Family History:  His family history includes Diabetes in his father; Heart disease in his mother; Hypertension in his father; Prostate cancer in his maternal grandfather. There is no history of Colon cancer.   Allergies Allergies  Allergen Reactions  . Nsaids     Chronic kidney disease   . Penicillins Rash    Has patient had a PCN reaction causing immediate rash, facial/tongue/throat swelling, SOB or lightheadedness with hypotension: Yes Has patient had a PCN reaction causing severe rash involving mucus membranes or skin necrosis: No Has patient had a PCN reaction that required hospitalization: No Has patient had a PCN reaction occurring within the last 10 years: No If all of the above answers are "NO", then may proceed with Cephalosporin use.      Home Medications  Prior to Admission medications   Medication Sig Start Date End Date Taking? Authorizing Provider  apixaban (ELIQUIS) 5 MG TABS tablet Take 1 tablet (5 mg total) by mouth 2 (two) times daily. 02/10/20   Nita Sells, MD  carvedilol (COREG) 25 MG tablet Take 1 tablet (25 mg total) by mouth 2 (two) times daily with a meal. 02/10/20   Nita Sells, MD  diltiazem (CARDIZEM CD) 360 MG 24 hr capsule Take 1 capsule (360 mg total) by mouth daily. 03/11/20   [name], MD  irbesartan (AVAPRO) 150 MG tablet Take 1 tablet (150 mg total) by mouth daily. 02/10/20   Nita Sells, MD  torsemide (DEMADEX) 20 MG tablet Take 1 tablet (20 mg total) by mouth daily. 03/11/20 06/09/20  [name], MD     Critical care time: 60 minutes    [name], [facility] [place] [facility] Pager: [phone]

## 2020-03-13 NOTE — Procedures (Signed)
Arterial Catheter Insertion Procedure Note  Jeremiah Keith  [id number]  1985/10/01  Date:03/01/2020  Time:2:41 PM    Provider Performing: Bradly Bienenstock    Procedure: Insertion of Arterial Line [phone]) with US guidance ([id number])   Indication(s) Blood pressure monitoring and/or need for frequent ABGs  Consent Risks of the procedure as well as the alternatives and risks of each were explained to the patient and/or caregiver.  Consent for the procedure was obtained and is signed in the bedside chart  Anesthesia None   Time Out Verified patient identification, verified procedure, site/side was marked, verified correct patient position, special equipment/implants available, medications/allergies/relevant history reviewed, required imaging and test results available.   Sterile Technique Maximal sterile technique including full sterile barrier drape, hand hygiene, sterile gown, sterile gloves, mask, hair covering, sterile ultrasound probe cover (if used).   Procedure Description Area of catheter insertion was cleaned with chlorhexidine and draped in sterile fashion. With real-time ultrasound guidance an arterial catheter was placed into the left femoral artery.  Appropriate arterial tracings confirmed on monitor.     Complications/Tolerance None; patient tolerated the procedure well.   EBL Minimal   Specimen(s) None  BIOPATCH applied to the insertion site.    [name], AGACNP-BC  Pulmonary & Critical Care Medicine Pager: [phone]

## 2020-03-13 NOTE — Procedures (Signed)
Waiting for central line placement -access not granted at this time.

## 2020-03-13 NOTE — ED Notes (Signed)
RT at bedside.

## 2020-03-13 NOTE — ED Notes (Addendum)
Other RN at bedside stating propofol was done and pt BP tanking. Pt BP remained with MAP >32 but systolic dropped to 95J. Unknown how long pt was finished with propofol prior to this RN coming into room. Fentanyl initiated and Propofol reinitiated due to pt BP being stable with 3 repeated being 90 systolic or above and MAP remaining >65.

## 2020-03-13 NOTE — ED Notes (Signed)
Pt BP trending down. Pt recent BP 86/67 but MAP remains >65. Pt continues propofol running at 67mcg, NS at 32ml/hr and amiodarone at 60mg /hr. MD made aware.

## 2020-03-13 NOTE — ED Notes (Signed)
Date and time results received: 02/22/2020 8:20 AM  (use smartphrase ".now" to insert current time)  Test: Lactic  Critical Value: 5.7  Name of Provider Notified: Dr. Corky Downs   Orders Received? Or Actions Taken?: No new orders at this time

## 2020-03-13 NOTE — ED Notes (Signed)
RT called for ABG and transport to CT

## 2020-03-13 NOTE — ED Notes (Signed)
Per NP need to hold on heparin until pt is in ICU due to needing central line

## 2020-03-13 NOTE — Progress Notes (Signed)
Provided supportive presence and spiritual support to father at time of admission. Father reports he was bringing Ambrose to a procedure at the hospital when he went into cardiac arrest. Father reports Saahir is a father of three and lives with wife and children. Chaplain offered prayer to father. Chaplain continues to be available upon request.     02/23/2020 0700  Clinical Encounter Type  Visited With Patient and family together  Visit Type ED  Referral From Nurse

## 2020-03-14 ENCOUNTER — Other Ambulatory Visit: Payer: Self-pay

## 2020-03-14 ENCOUNTER — Inpatient Hospital Stay: Payer: Medicaid Other

## 2020-03-14 ENCOUNTER — Encounter: Payer: Self-pay | Admitting: Cardiology

## 2020-03-14 DIAGNOSIS — J9601 Acute respiratory failure with hypoxia: Secondary | ICD-10-CM

## 2020-03-14 DIAGNOSIS — I5082 Biventricular heart failure: Secondary | ICD-10-CM | POA: Diagnosis not present

## 2020-03-14 DIAGNOSIS — I499 Cardiac arrhythmia, unspecified: Secondary | ICD-10-CM | POA: Diagnosis not present

## 2020-03-14 DIAGNOSIS — I469 Cardiac arrest, cause unspecified: Secondary | ICD-10-CM | POA: Diagnosis not present

## 2020-03-14 LAB — CBC
HCT: 42.6 % (ref 39.0–52.0)
Hemoglobin: 14 g/dL (ref 13.0–17.0)
MCH: 30.6 pg (ref 26.0–34.0)
MCHC: 32.9 g/dL (ref 30.0–36.0)
MCV: 93 fL (ref 80.0–100.0)
Platelets: 263 10*3/uL (ref 150–400)
RBC: 4.58 MIL/uL (ref 4.22–5.81)
RDW: 12.9 % (ref 11.5–15.5)
WBC: 13.3 10*3/uL — ABNORMAL HIGH (ref 4.0–10.5)
nRBC: 0 % (ref 0.0–0.2)

## 2020-03-14 LAB — BLOOD GAS, ARTERIAL
Acid-base deficit: 4.4 mmol/L — ABNORMAL HIGH (ref 0.0–2.0)
Bicarbonate: 23 mmol/L (ref 20.0–28.0)
FIO2: 0.4
MECHVT: 500 mL
O2 Saturation: 99 %
PEEP: 8 cmH2O
Patient temperature: 37
RATE: 15 resp/min
pCO2 arterial: 50 mmHg — ABNORMAL HIGH (ref 32.0–48.0)
pH, Arterial: 7.27 — ABNORMAL LOW (ref 7.350–7.450)
pO2, Arterial: 145 mmHg — ABNORMAL HIGH (ref 83.0–108.0)

## 2020-03-14 LAB — PHOSPHORUS: Phosphorus: 4.9 mg/dL — ABNORMAL HIGH (ref 2.5–4.6)

## 2020-03-14 LAB — URINE CULTURE: Culture: NO GROWTH

## 2020-03-14 LAB — BASIC METABOLIC PANEL
Anion gap: 10 (ref 5–15)
BUN: 29 mg/dL — ABNORMAL HIGH (ref 6–20)
CO2: 21 mmol/L — ABNORMAL LOW (ref 22–32)
Calcium: 8.8 mg/dL — ABNORMAL LOW (ref 8.9–10.3)
Chloride: 110 mmol/L (ref 98–111)
Creatinine, Ser: 2.64 mg/dL — ABNORMAL HIGH (ref 0.61–1.24)
GFR, Estimated: 31 mL/min — ABNORMAL LOW (ref >60)
Glucose, Bld: 122 mg/dL — ABNORMAL HIGH (ref 70–99)
Potassium: 4.8 mmol/L (ref 3.5–5.1)
Sodium: 141 mmol/L (ref 135–145)

## 2020-03-14 LAB — GLUCOSE, CAPILLARY
Glucose-Capillary: 72 mg/dL (ref 70–99)
Glucose-Capillary: 80 mg/dL (ref 70–99)
Glucose-Capillary: 79 mg/dL (ref 70–99)
Glucose-Capillary: 52 mg/dL — ABNORMAL LOW (ref 70–99)
Glucose-Capillary: 66 mg/dL — ABNORMAL LOW (ref 70–99)
Glucose-Capillary: 127 mg/dL — ABNORMAL HIGH (ref 70–99)
Glucose-Capillary: 97 mg/dL (ref 70–99)
Glucose-Capillary: 67 mg/dL — ABNORMAL LOW (ref 70–99)
Glucose-Capillary: 120 mg/dL — ABNORMAL HIGH (ref 70–99)
Glucose-Capillary: 88 mg/dL (ref 70–99)

## 2020-03-14 LAB — APTT
aPTT: 99 seconds — ABNORMAL HIGH (ref 24–36)
aPTT: 71 seconds — ABNORMAL HIGH (ref 24–36)

## 2020-03-14 LAB — PROCALCITONIN: Procalcitonin: 0.38 ng/mL

## 2020-03-14 LAB — MAGNESIUM: Magnesium: 2.3 mg/dL (ref 1.7–2.4)

## 2020-03-14 LAB — HEPARIN LEVEL (UNFRACTIONATED): Heparin Unfractionated: 1.4 IU/mL — ABNORMAL HIGH (ref 0.30–0.70)

## 2020-03-14 LAB — TRIGLYCERIDES: Triglycerides: 116 mg/dL (ref <150)

## 2020-03-14 MED ORDER — ORAL CARE MOUTH RINSE: 15.0000 mL | Freq: Two times a day (BID) | OROMUCOSAL | Status: DC

## 2020-03-14 MED ORDER — ACETAMINOPHEN 650 MG RE SUPP
650.0000 mg | Freq: Four times a day (QID) | RECTAL | Status: DC | PRN
  Filled 2020-03-14: qty 1

## 2020-03-14 MED ORDER — ADULT MULTIVITAMIN LIQUID CH
15.0000 mL | Freq: Every day | ORAL | Status: DC
  Administered 2020-03-15 – 2020-03-18 (×4): 15 mL
  Filled 2020-03-14 (×5): qty 15

## 2020-03-14 MED ORDER — DEXTROSE 50 % IV SOLN
INTRAVENOUS | Status: AC
  Administered 2020-03-14: 11:00:00 25 mL via INTRAVENOUS
  Filled 2020-03-14: qty 50

## 2020-03-14 MED ORDER — VITAL HIGH PROTEIN PO LIQD
1000.0000 mL | ORAL | Status: DC
  Administered 2020-03-14 – 2020-03-18 (×4): 1000 mL

## 2020-03-14 MED ORDER — DEXTROSE 50 % IV SOLN
25.0000 mL | Freq: Once | INTRAVENOUS | Status: AC
  Administered 2020-03-14: 20:00:00 25 mL via INTRAVENOUS

## 2020-03-14 MED ORDER — DEXTROSE 50 % IV SOLN
25.0000 mL | INTRAVENOUS | Status: DC | PRN
  Administered 2020-03-15: 25 mL via INTRAVENOUS
  Filled 2020-03-14: qty 50

## 2020-03-14 MED ORDER — DEXTROSE 50 % IV SOLN
INTRAVENOUS | Status: AC
  Filled 2020-03-14: qty 50

## 2020-03-14 MED ORDER — PROSOURCE TF PO LIQD
45.0000 mL | Freq: Every day | ORAL | Status: DC
  Administered 2020-03-15 – 2020-03-18 (×3): 45 mL
  Filled 2020-03-14 (×5): qty 45

## 2020-03-14 MED ORDER — CHLORHEXIDINE GLUCONATE CLOTH 2 % EX PADS
6.0000 | MEDICATED_PAD | Freq: Every day | CUTANEOUS | Status: DC
  Administered 2020-03-14 – 2020-03-20 (×8): 6 via TOPICAL

## 2020-03-14 MED ORDER — VECURONIUM BROMIDE 10 MG IV SOLR: 10.0000 mg | Freq: Once | INTRAVENOUS | Status: DC

## 2020-03-14 MED ORDER — MIDAZOLAM HCL 2 MG/2ML IJ SOLN
2.0000 mg | Freq: Once | INTRAMUSCULAR | Status: DC
  Filled 2020-03-14 (×2): qty 2

## 2020-03-14 MED ORDER — MIDAZOLAM HCL 2 MG/2ML IJ SOLN
1.0000 mg | INTRAMUSCULAR | Status: DC | PRN
  Administered 2020-03-14 – 2020-03-19 (×13): 2 mg via INTRAVENOUS
  Filled 2020-03-14 (×14): qty 2

## 2020-03-14 MED ORDER — MIDAZOLAM HCL 2 MG/2ML IJ SOLN
INTRAMUSCULAR | Status: AC
  Filled 2020-03-14: qty 2

## 2020-03-14 MED ORDER — DEXTROSE-NACL 5-0.9 % IV SOLN: INTRAVENOUS | Status: DC

## 2020-03-14 MED ORDER — DOPAMINE-DEXTROSE 3.2-5 MG/ML-% IV SOLN: 0.0000 ug/kg/min | INTRAVENOUS | Status: DC

## 2020-03-14 NOTE — Progress Notes (Signed)
NAME:  Jeremiah Keith, MRN:  [id number], DOB:  20-Dec-1985, LOS: 1 ADMISSION DATE:  02/22/2020, CONSULTATION DATE:  03/12/2020 REFERRING MD:  Dr. Corky Downs, CHIEF COMPLAINT:  Cardiac Arrest   Brief History:  34 y.o. Male with a PMH significant for Paroxsymal Atrial fibrillation, Hypertension, and CKD admitted s/p out of hospital cardiac arrest (V-fib).  Being placed on Targeted Temperature Management @ 36 C. 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  History of Present Illness:  Jeremiah Keith is a 34 year old male with a past medical history significant for paroxysmal atrial fibrillation on Eliquis for 4 weeks, chronic kidney disease, and hypertension who presented to [facility] ED on 02/24/2020 following an out of hospital V. fib cardiac arrest.  Apparently he was scheduled to have a cardioversion done today and in route to the appointment his father noted he became unresponsive in the car where he subsequently pulled into an EMS station.  CPR was started, and he received defibrillation x3 for reported V. fib arrest with ROSC obtained.  Estimated time of collapse to CPR is 4 minutes, with ROSC obtained after 6 minutes 43 seconds.  ED course: Upon arrival to the ED he was emergently intubated.  He remained unresponsive therefore he was placed on targeted temperature management at 36 C.  Cardiology was consulted and recommended placing on amiodarone drip.  Initial work-up shows bicarb 21, BUN 29, creatinine 2.96, glucose 263, BNP 229.5, high-sensitivity troponin 22, lactic acid 5.7, WBC 17.2 with neutrophilia, and procalcitonin is negative.  Chest x-ray is negative for any acute cardiopulmonary process.  His SARS-CoV-2 PCR and influenza PCR both negative.  Urinalysis is not consistent with UTI.  Urine drug screen is positive for cannabinoid.  CT head obtained which showed mild hypodensity in the frontal white matter (right greater than left)  which is nonspecific in appearance, along with possible chronic ischemia.  PCCM is asked to admit the patient to ICU for further work-up and treatment of V. fib cardiac arrest.  Post admission to ICU he is now hypotensive requiring vasopressors.  Echocardiogram performed revealing Cardiomyopathy and biventricular failure with EF of 20 to 25%.  Central venous access and arterial line has been placed.   Past Medical History:  Paroxsymal Atrial Fibrillation Hypertension Aortic Root Dilatation Substance Abuse Chicken Pox  Significant Hospital Events:  12/29: Presented to ED w/ cardiac arrest, intubated, TTM being initiated 12/30: Weaning off pressors; to begin rewarming @ 1300  Consults:  PCCM  Cardiology Nephrology  Procedures:  12/29: Endotracheal intubation 12/29: Left femoral CVC placed 12/29: Left femoral A-line placed  Significant Diagnostic Tests:  12/29: Chest x-ray>>Portable AP supine view at 0718 hours. Intubated. Endotracheal tube tip in good position between the level the clavicles and carina. Enteric tube courses to the abdomen, tip not included. Partially visible gas in the stomach. Resuscitation pads project over the lower chest. Somewhat low lung volumes. Mediastinal contours remain within normal limits. Allowing for portable technique the lungs are clear. No osseous abnormality identified. 12/29: Abdominal X-ray>>Enteric tube terminates in the stomach, side hole at the gastric fundus. Moderate gastric distension. 12/29: CT Head w/o Contrast>>Mild hypodensity in the frontal white matter right greater than left, not present in 2017. Nonspecific appearance. Possible chronic ischemia however given the patient's age consider MRI of the brain without and with contrast for further evaluation 12/29: 2D Echocardiogram>>1. Left ventricular ejection fraction, by estimation, is 20 to 25%. The  left ventricle has severely decreased function. The left ventricle  demonstrates global hypokinesis. There is mild left ventricular  hypertrophy. Left ventricular diastolic parameters  are consistent with Grade I diastolic dysfunction (impaired relaxation).  2. Right ventricular systolic function is severely reduced. The right  ventricular size is normal.  3. The mitral valve is normal in structure. No evidence of mitral valve  regurgitation.  4. The aortic valve is normal in structure. Aortic valve regurgitation is  trivial.  5. Aortic dilatation noted. There is mild dilatation of the aortic root,  measuring 42 mm.  12/29: EEG>>This study is suggestive of profoud diffuse encephalopathy, nonspecific etiology but likely related to sedation, anoxic/hypoxic brain injury. No seizures or epileptiform discharges were seen throughout the recording.  Micro Data:  12/29: SARS-CoV-2 PCR>>negative 12/29: Influenza PCR>>negative 12/29: Blood culture x2>> 12/29: Urine>> 12/29: Tracheal aspirate>>  Antimicrobials:  N/A  Interim History / Subjective:  No acute events noted overnight Remains on TTM, to start rewarming today @ 1300 Weaning vasopressors  Objective   Blood pressure 113/72, pulse (!) 54, temperature (!) 96.98 F (36.1 C), resp. rate 18, weight 106.2 kg, SpO2 100 %.    Vent Mode: PRVC FiO2 (%):  [30 %-50 %] 30 % Set Rate:  [15 bmp-24 bmp] 20 bmp Vt Set:  [500 mL] 500 mL PEEP:  [5 cmH20-8 cmH20] 8 cmH20 Plateau Pressure:  [17 cmH20-20 cmH20] 17 cmH20   Intake/Output Summary (Last 24 hours) at 03/14/2020 2353 Last data filed at 03/14/2020 0800 Gross per 24 hour  Intake 3762.92 ml  Output 2235 ml  Net 1527.92 ml   Filed Weights   02/24/2020 1027  Weight: 106.2 kg    Examination: General: Critically ill appearing male, laying in bed, intubated and sedated, in NAD HENT: Atraumatic, normocephalic, neck supple, no JVD Lungs: Clear breath sounds bilaterally, no wheezing or rales, synchronous with the vent, even Cardiovascular:  Bradycardia, regular rhythm, s1s2, no M/R/G Abdomen: Soft, nontender, nondistended, no guarding or rebound tenderness Extremities: Normal bulk and tone, no deformities, no edema Neuro: Unresponsive post cardiac arrest, currently sedated for TTM, pupils PERRL Skin: Cool and dry.  No obvious rashes, lesions, ulcerations  Resolved Hospital Problem list   N/A  Assessment & Plan:   V. Fib Cardiac arrest Cardiogenic Shock Cardiomyopathy, Biventricular failure (EF 25% on Echo 02/21/2020) Paroxsymal Atrial Fibrillation Hx: HTN -Continuous cardiac monitoring -Maintain MAP >65 -Vasopressors as needed to maintain MAP goal -Trend lactic acid -Trend Troponin -Targeted Temperature Management @ 36 C -Cardiology consulted, appreciate input -Continue Amiodarone gtt and place on Heparin gtt as per discussion with Cardiology -Unable to diurese at this time due to hypotension and AKI -Echo on 02/24/2020 with: 1. Left ventricular ejection fraction, by estimation, is 20 to 25%. The  left ventricle has severely decreased function. The left ventricle  demonstrates global hypokinesis. There is mild left ventricular  hypertrophy. Left ventricular diastolic parameters  are consistent with Grade I diastolic dysfunction (impaired relaxation).  2. Right ventricular systolic function is severely reduced. The right  ventricular size is normal.  3. The mitral valve is normal in structure. No evidence of mitral valve  regurgitation.  4. The aortic valve is normal in structure. Aortic valve regurgitation is  trivial.  5. Aortic dilatation noted. There is mild dilatation of the aortic root,  measuring 42 mm.  -Urine drug screen positive for cannabinoid   Acute Hypoxic Respiratory Failure in the setting of Cardiac Arrest Suspected Aspiration Pneumonitis -Full vent support, vent settings established and reviewed -Wean FiO2 & PEEP as tolerated to maintain O2  sats >92% -Follow intermittent CXR and ABG as  needed -VAP Protocol implemented -Spontaneous breathing trials when respiratory parameters met and mental status permits   Leukocytosis Suspected Aspiration Pneumonitis -Monitor fever curve -Trend WBC's & Procalcitonin -Procalcitonin is negative, will hold off on empiric ABX for now -Follow pan cultures as above -CXR without evidence of pneumonia -UA not concerning for UTI -Abdominal exam is benign   AKI superimposed on CKD Mild Hyperkalemia>>resolved -Monitor I&O's / urinary output -Follow BMP -Ensure adequate renal perfusion -Avoid nephrotoxic agents as able -Replace electrolytes as indicated -Nephrology consulted, appreciate input    Unresponsive s/p cardiac arrest -Targeted Temperature Management @ 36 C -Maintain RASS -4 while on TTM -Propofol, Fentanyl, and Nimbex if needed to maintain RASS goal and to prevent shivering -Daily wake up assessments once TTM complete -CT Head on 02/17/2020 with mild hypodensity in the frontal white matter, nonspecific in appearance; also with possible chronic ischemia -Will need follow up MRI Brain -Obtain EEG -Consult Neurology when TTM complete   Hyperglycemia -CBG's -Sliding scale insulin -Follow ICU Hypo/hyperglycemia protocol  -Check Hgb A1c   Best practice (evaluated daily)  Diet: NPO Pain/Anxiety/Delirium protocol (if indicated): Fentanyl and Propfol VAP protocol (if indicated): Yes, HOB 30 degrees DVT prophylaxis: Heparin gtt GI prophylaxis: Pepcid IV Glucose control: SSI Mobility: Bedrest Disposition:ICU  Goals of Care:  Last date of multidisciplinary goals of care discussion: 03/14/20 Family and staff present: Pt's mother present at bedside 03/14/20 Summary of discussion: Cardiac arrest, TTM  Follow up goals of care discussion due: 03/15/20 Code Status: Full Code  Labs   CBC: Recent Labs  Lab 03/11/20 0948 03/02/2020 0700 03/14/20 0334  WBC 9.4 17.2* 13.3*  NEUTROABS  --  10.2*  --   HGB 17.1 15.9 14.0   HCT 48.6 47.2 42.6  MCV 88 91.7 93.0  PLT 221 254 128    Basic Metabolic Panel: Recent Labs  Lab 02/29/2020 0700 03/04/2020 1237 03/07/2020 1708 02/17/2020 2043 03/14/20 0334  NA 137 141 141 142 141  K 3.5 5.5* 4.1 4.9 4.8  CL 103 110 109 109 110  CO2 21* 23 21* 20* 21*  GLUCOSE 263* 145* 217* 117* 122*  BUN 29* 32* 33* 32* 29*  CREATININE 2.96* 2.94* 2.92* 2.93* 2.64*  CALCIUM 8.8* 8.2* 7.9* 8.7* 8.8*  MG  --   --   --   --  2.3  PHOS  --   --   --   --  4.9*   GFR: Estimated Creatinine Clearance: 44.9 mL/min (A) (by C-G formula based on SCr of 2.64 mg/dL (H)). Recent Labs  Lab 03/11/20 0948 02/22/2020 0700 03/14/2020 0903 02/19/2020 1237 03/14/20 0334  PROCALCITON  --   --   --  <0.10 0.38  WBC 9.4 17.2*  --   --  13.3*  LATICACIDVEN  --  5.7* 1.5  --   --     Liver Function Tests: Recent Labs  Lab 03/02/2020 0700 02/21/2020 1708  AST 28 22  ALT 19 23  ALKPHOS 72 65  BILITOT 1.0 0.6  PROT 6.8 5.7*  ALBUMIN 3.9 3.3*   No results for input(s): LIPASE, AMYLASE in the last 168 hours. No results for input(s): AMMONIA in the last 168 hours.  ABG    Component Value Date/Time   PHART 7.27 (L) 03/14/2020 0500   PCO2ART 50 (H) 03/14/2020 0500   PO2ART 145 (H) 03/14/2020 0500   HCO3 23.0 03/14/2020 0500   ACIDBASEDEF 4.4 (H) 03/14/2020 0500   O2SAT  99.0 03/14/2020 0500     Coagulation Profile: Recent Labs  Lab 02/20/2020 0700 03/08/2020 1115  INR 1.2 1.2    Cardiac Enzymes: No results for input(s): CKTOTAL, CKMB, CKMBINDEX, TROPONINI in the last 168 hours.  HbA1C: Hgb A1c MFr Bld  Date/Time Value Ref Range Status  02/27/2020 12:37 PM 5.0 4.8 - 5.6 % Final    Comment:    (NOTE) Pre diabetes:          5.7%-6.4%  Diabetes:              >6.4%  Glycemic control for   <7.0% adults with diabetes     CBG: Recent Labs  Lab 03/06/2020 1922 02/19/2020 2321 03/14/20 0324 03/14/20 0559 03/14/20 0712  GLUCAP 101* 112* 72 80 79    Review of Systems:   Unable to  assess due to Critical illness, intubation, and sedation  Past Medical History:  He,  has a past medical history of Aortic root dilatation ([place]), CKD (chronic kidney disease), Depression, History of chicken pox, History of echocardiogram, History of stress test, Hypertension, PAF (paroxysmal atrial fibrillation) ([place]), and Substance abuse ([place]).   Surgical History:   Past Surgical History:  Procedure Laterality Date  . TONSILLECTOMY AND ADENOIDECTOMY  1990     Social History:   reports that he quit smoking about 4 weeks ago. His smoking use included cigarettes. He smoked 25.00 packs per day. He has never used smokeless tobacco. He reports current alcohol use. He reports previous drug use.   Family History:  His family history includes Diabetes in his father; Heart disease in his mother; Hypertension in his father; Prostate cancer in his maternal grandfather. There is no history of Colon cancer.   Allergies Allergies  Allergen Reactions  . Nsaids     Chronic kidney disease  . Penicillins Rash    Has patient had a PCN reaction causing immediate rash, facial/tongue/throat swelling, SOB or lightheadedness with hypotension: Yes Has patient had a PCN reaction causing severe rash involving mucus membranes or skin necrosis: No Has patient had a PCN reaction that required hospitalization: No Has patient had a PCN reaction occurring within the last 10 years: No If all of the above answers are "NO", then may proceed with Cephalosporin use.      Home Medications  Prior to Admission medications   Medication Sig Start Date End Date Taking? Authorizing Provider  apixaban (ELIQUIS) 5 MG TABS tablet Take 1 tablet (5 mg total) by mouth 2 (two) times daily. 02/10/20   Nita Sells, MD  carvedilol (COREG) 25 MG tablet Take 1 tablet (25 mg total) by mouth 2 (two) times daily with a meal. 02/10/20   Nita Sells, MD  diltiazem (CARDIZEM CD) 360 MG 24 hr capsule Take 1 capsule (360 mg  total) by mouth daily. 03/11/20   [name], MD  irbesartan (AVAPRO) 150 MG tablet Take 1 tablet (150 mg total) by mouth daily. 02/10/20   Nita Sells, MD  torsemide (DEMADEX) 20 MG tablet Take 1 tablet (20 mg total) by mouth daily. 03/11/20 06/09/20  [name], MD     Critical care time: 72 minutes   [name], [facility] [facility] Pager: [phone]

## 2020-03-14 NOTE — Progress Notes (Signed)
Progress Note  Patient Name: Jeremiah Keith Date of Encounter: 03/14/2020  Primary Cardiologist: [place] intubated and sedated under [facility] protocol this morning. Echo showed a new cardiomyopathy with an EF of 20-25%, mild LVH, Gr1DD, severely reduced RVSF with normal RV cavity size, trivial AI, mildly dilated aortic root 42 mm. Vitals stable. No ventricular ectopy on telemetry.   Inpatient Medications    Scheduled Meds: . artificial tears  1 application Both Eyes G9F  . aspirin  300 mg Rectal NOW  . atropine      . chlorhexidine gluconate (MEDLINE KIT)  15 mL Mouth Rinse BID  . Chlorhexidine Gluconate Cloth  6 each Topical Q0600  . Chlorhexidine Gluconate Cloth  6 each Topical Q0600  . cisatracurium  0.1 mg/kg Intravenous Once  . fentaNYL (SUBLIMAZE) injection  100 mcg Intravenous Once  . fentaNYL (SUBLIMAZE) injection  50 mcg Intravenous Once  . insulin aspart  0-9 Units Subcutaneous Q4H  . mouth rinse  15 mL Mouth Rinse 10 times per day  . mouth rinse  15 mL Mouth Rinse BID   Continuous Infusions: . sodium chloride 75 mL/hr at 03/14/20 0729  . amiodarone Stopped (02/18/2020 1444)  . cisatracurium (NIMBEX) infusion 2 mg/mL    . DOPamine    . epinephrine 2 mcg/min (03/14/20 0729)  . famotidine (PEPCID) IV 20 mg (02/25/2020 2218)  . fentaNYL infusion INTRAVENOUS 150 mcg/hr (03/14/20 0729)  . heparin 1,150 Units/hr (03/14/20 0729)  . midazolam Stopped (02/14/2020 0950)  . norepinephrine (LEVOPHED) Adult infusion Stopped (03/04/2020 1646)  . propofol (DIPRIVAN) infusion 40 mcg/kg/min (03/14/20 0729)   PRN Meds: atropine, cisatracurium **AND** cisatracurium (NIMBEX) infusion 2 mg/mL **AND** cisatracurium, fentaNYL, fentaNYL   Vital Signs    Vitals:   03/14/20 0745 03/14/20 0750 03/14/20 0755 03/14/20 0800  BP:    113/72  Pulse: (!) 50 (!) 58 (!) 52 (!) 54  Resp: 17 20 17 18   Temp: (!) 96.8 F (36 C) (!) 96.98 F (36.1 C) (!) 96.98 F  (36.1 C) (!) 96.98 F (36.1 C)  TempSrc:      SpO2: 100% 100% 100% 100%  Weight:        Intake/Output Summary (Last 24 hours) at 03/14/2020 0831 Last data filed at 03/14/2020 0800 Gross per 24 hour  Intake 3762.92 ml  Output 2235 ml  Net 1527.92 ml   Filed Weights   03/06/2020 1027  Weight: 106.2 kg    Telemetry    Sinus bradycardia - Personally Reviewed  ECG    Sinus bradycardia, 46 bpm, RBBB, no acute st/t changes - Personally Reviewed  Physical Exam   GEN: Critically ill appearing, intubated, sedated, and cooling.   Neck: JVD difficult to assess secondary to respiratory support apparatus.  Cardiac: RRR, no murmurs, rubs, or gallops.  Respiratory: Vented breath sounds.  GI: Soft, nontender, non-distended.   MS: No edema; No deformity. Neuro:  Intubated and sedated. Psych: Intubated and sedated.   Labs    Chemistry Recent Labs  Lab 03/11/20 0948 03/01/2020 0700 03/05/2020 1237 02/14/2020 1708 03/05/2020 2043 03/14/20 0334  NA 141 137   < > 141 142 141  K 4.4 3.5   < > 4.1 4.9 4.8  CL 107* 103   < > 109 109 110  CO2 19* 21*   < > 21* 20* 21*  GLUCOSE 115* 263*   < > 217* 117* 122*  BUN 23* 29*   < > 33* 32* 29*  CREATININE 2.41* 2.96*   < > 2.92* 2.93* 2.64*  CALCIUM 9.5 8.8*   < > 7.9* 8.7* 8.8*  PROT  --  6.8  --  5.7*  --   --   ALBUMIN  --  3.9  --  3.3*  --   --   AST  --  28  --  22  --   --   ALT  --  19  --  23  --   --   ALKPHOS  --  72  --  65  --   --   BILITOT  --  1.0  --  0.6  --   --   GFRNONAA 34* 27*   < > 28* 28* 31*  GFRAA 39*  --   --   --   --   --   ANIONGAP  --  13   < > 11 13 10    < > = values in this interval not displayed.     Hematology Recent Labs  Lab 03/11/20 0948 03/15/2020 0700 03/14/20 0334  WBC 9.4 17.2* 13.3*  RBC 5.52 5.15 4.58  HGB 17.1 15.9 14.0  HCT 48.6 47.2 42.6  MCV 88 91.7 93.0  MCH 31.0 30.9 30.6  MCHC 35.2 33.7 32.9  RDW 12.2 12.7 12.9  PLT 221 254 263    Cardiac EnzymesNo results for input(s):  TROPONINI in the last 168 hours. No results for input(s): TROPIPOC in the last 168 hours.   BNP Recent Labs  Lab 02/15/2020 0700  BNP 229.5*     DDimer No results for input(s): DDIMER in the last 168 hours.   Radiology    DG Abdomen 1 View  Result Date: 02/22/2020 IMPRESSION: Enteric tube terminates in the stomach, side hole at the gastric fundus. Moderate gastric distension. Electronically Signed   By: Genevie Ann M.D.   On: 02/18/2020 07:55   CT Head Wo Contrast  Result Date: 02/26/2020 IMPRESSION: Mild hypodensity in the frontal white matter right greater than left, not present in 2017. Nonspecific appearance. Possible chronic ischemia however given the patient's age consider MRI of the brain without and with contrast for further evaluation Electronically Signed   By: Franchot Gallo M.D.   On: 03/08/2020 08:12   DG Chest Port 1 View  Result Date: 03/14/2020 IMPRESSION: Stable examination. Stable support tubes. No focal pulmonary infiltrate. Electronically Signed   By: Fidela Salisbury MD   On: 03/14/2020 06:00   DG Chest Port 1 View  Result Date: 02/15/2020 IMPRESSION: 1. The endotracheal tube is now 2.4 cm above the carina. 2. No acute cardiopulmonary findings. Electronically Signed   By: Marijo Sanes M.D.   On: 03/02/2020 15:11   DG Chest Port 1 View  Result Date: 02/21/2020 IMPRESSION: Endotracheal tube tip just below the thoracic inlet. Enteric tube extends below the diaphragm. No acute cardiopulmonary process. Electronically Signed   By: Richardean Sale M.D.   On: 02/22/2020 11:19   DG Chest Port 1 View  Result Date: 03/11/2020 IMPRESSION: 1. ET tube and enteric tube appear satisfactory. 2. Low lung volumes with no acute cardiopulmonary abnormality. Electronically Signed   By: Genevie Ann M.D.   On: 03/01/2020 07:54   EEG adult  Result Date: 02/24/2020 IMPRESSION: This study is suggestive of profoud diffuse encephalopathy, nonspecific etiology but likely related to  sedation, anoxic/hypoxic brain injury. No seizures or epileptiform discharges were seen throughout the recording. Lora Havens  Cardiac Studies   2D echo  02/14/2020: 1. Left ventricular ejection fraction, by estimation, is 20 to 25%. The  left ventricle has severely decreased function. The left ventricle  demonstrates global hypokinesis. There is mild left ventricular  hypertrophy. Left ventricular diastolic parameters  are consistent with Grade I diastolic dysfunction (impaired relaxation).  2. Right ventricular systolic function is severely reduced. The right  ventricular size is normal.  3. The mitral valve is normal in structure. No evidence of mitral valve  regurgitation.  4. The aortic valve is normal in structure. Aortic valve regurgitation is  trivial.  5. Aortic dilatation noted. There is mild dilatation of the aortic root,  measuring 42 mm.  __________  2D echo 02/09/2020: 1. Left ventricular ejection fraction, by estimation, is 55 to 60%. The  left ventricle has normal function. The left ventricle has no regional  wall motion abnormalities. There is mild left ventricular hypertrophy.  Left ventricular diastolic function  could not be evaluated.  2. Right ventricular systolic function is normal. The right ventricular  size is normal. There is normal pulmonary artery systolic pressure.  3. The mitral valve is normal in structure. No evidence of mitral valve  regurgitation. No evidence of mitral stenosis.  4. The aortic valve is tricuspid. Aortic valve regurgitation is trivial.  No aortic stenosis is present.  5. Aortic dilatation noted. There is mild dilatation of the aortic root,  measuring 43 mm.  6. The inferior vena cava is normal in size with greater than 50%  respiratory variability, suggesting right atrial pressure of 3 mmHg. __________  2D echo 11/01/2017: - Left ventricle: The cavity size was normal. There was moderate  concentric hypertrophy.  Systolic function was normal. The  estimated ejection fraction was in the range of 55% to 60%. Wall  motion was normal; there were no regional wall motion  abnormalities.  - Aorta: Aortic root dimension: 42 mm (ED).  - Aortic root: The aortic root was mildly dilated.  - Left atrium: The atrium was mildly dilated.  - Right atrium: The atrium was mildly dilated.  - Pulmonary arteries: Systolic pressure could not be accurately  estimated.  __________  Carlton Adam MPI 11/03/2017:  Abnormal myocardial perfusion stress test due to moderately reduced left ventricular systolic function (LVEF 16-10%).  The left ventricle is dilated with diffuse hypokinesis.  There is no significant ischemia or scar.  This is an intermediate risk study.    Patient Profile     34 y.o. male with history of persistent Afib, CKD stage IIIb, HTN, and tobacco use who presented with VF arrest.   Assessment & Plan    1. VF arrest with acute hypoxic respiratory arrest: -No further episodes since admission -Continue amiodarone gtt, monitor heart rate -Maintain magnesium and potassium at goal 2.0 and 4.0, respectively  -Pending neurological function upon warming and removal of sedation, he will need a R/LHC, EP evaluation for ICD for secondary prevention, and a cMRI prior to ICD implantation  -Vent management per PCCM  2. Acute biventricular failure: -He appears euvolemic -Maintain net equal intake/output  -Unable to escalate GDMT at this time with underlying hypotension requiring pressor support and bradycardia -Strict I/O  3. Elevated troponin: -Minimally elevate with a current peak of 136, possibly in the setting of stunned myocardium following defibrillation  -Continue to cycle troponin until peak -Heparin gtt  -Will need R/LHC pending neurologic function as above next week   4. Persistent Afib: -Patient was initially scheduled for DCCV on 12/29, though suffered VF arrest en route to  the hospital  for his DCCV -With defibrillation of VF, he is now maintaining sinus rhythm with a bradycardic rate -CHADS2VASc 2 (CHF, HTN) -Heparin gtt for now   5. Acute on CKD stage IIIb: -Likely ATN in the setting of hypotension with the above -Nephrology following     For questions or updates, please contact [place] Please consult [email or web address] for contact info under Cardiology/STEMI.    Signed, Christell Faith, PA-C [place] Pager: [phone] 03/14/2020, 8:31 AM

## 2020-03-14 NOTE — Progress Notes (Signed)
[facility] for heparin Indication: chest pain/ACS  Allergies  Allergen Reactions  . Nsaids     Chronic kidney disease  . Penicillins Rash    Has patient had a PCN reaction causing immediate rash, facial/tongue/throat swelling, SOB or lightheadedness with hypotension: Yes Has patient had a PCN reaction causing severe rash involving mucus membranes or skin necrosis: No Has patient had a PCN reaction that required hospitalization: No Has patient had a PCN reaction occurring within the last 10 years: No If all of the above answers are "NO", then may proceed with Cephalosporin use.     Patient Measurements: Weight: 106.2 kg (234 lb 2.1 oz) Heparin Dosing Weight: 88 kg  Vital Signs: Temp: 96.62 F (35.9 C) (12/30 0600) Temp Source: Bladder (12/29 1900) BP: 146/93 (12/30 0600) Pulse Rate: 44 (12/30 0600)  Labs: Recent Labs    03/11/20 0948 03/11/20 0948 03/02/2020 0700 03/11/2020 0903 03/10/2020 1115 02/14/2020 1237 02/29/2020 1708 03/01/2020 2043 03/14/20 0334  HGB 17.1  --  15.9  --   --   --   --   --  14.0  HCT 48.6  --  47.2  --   --   --   --   --  42.6  PLT 221  --  254  --   --   --   --   --  263  APTT  --    < > 30  --  33  --   --  85* 99*  LABPROT  --   --  14.7  --  15.0  --   --   --   --   INR  --   --  1.2  --  1.2  --   --   --   --   HEPARINUNFRC  --   --   --   --  1.79*  --   --   --  1.40*  CREATININE 2.41*  --  2.96*  --   --  2.94* 2.92* 2.93*  --   TROPONINIHS  --   --  22*   < >  --  106* 126* 136*  --    < > = values in this interval not displayed.    Estimated Creatinine Clearance: 40.5 mL/min (A) (by C-G formula based on SCr of 2.93 mg/dL (H)).   Medical History: Past Medical History:  Diagnosis Date  . Aortic root dilatation ([place])    a. 01/2020 Echo: Ao root 4.3cm.  . CKD (chronic kidney disease)   . Depression    after death of family members  . History of chicken pox   . History of echocardiogram    a.  01/2020 Echo: EF 55-60%. No rwma. Mild LVH. Triv AI. Ao root 4.3 cm.  . History of stress test    a. 10/2017 MV: EF 40-45%, diff HK. No scar/ischemia-->intermediate risk in setting of depressed EF however, EF 55-60% by echo-->med managed.  . Hypertension   . PAF (paroxysmal atrial fibrillation) (HCC)    a. CHA2DS2VASc = 1-->eliquis.  . Substance abuse [facility])    h/o cocaine abuse     Assessment: 34 year old male s/p cardiac arrest with ROSC requiring multiple defibrillations and epinephrine. Patient was on his way to an appointment for cardioversion for persistent afib. Patient does take Eliquis PTA. Due to nature of presentation, post-arrest and now on Code Cool, difficult to assess last dose of Eliquis PTA. Likely this morning or  last night.   Goal of Therapy:  Heparin level 0.3-0.7 units/ml aPTT 66-102 seconds Monitor platelets by anticoagulation protocol: Yes   Plan:  12/29:  APTT @ 2043 = 85  12/30:  APTT @ 0334 = 99, therapeutic, HL 1.40 Will continue pt on current rate and recheck aPTT @ 1000.  HL daily until aPTT and HL begin to correlate.   Renda Rolls, PharmD, [facility] 03/14/2020 6:48 AM

## 2020-03-14 NOTE — Progress Notes (Addendum)
Initial Nutrition Assessment  DOCUMENTATION CODES:   Obesity unspecified  INTERVENTION:   Vital HP @60ml /hr- Initiate at 91ml/hr and increase by 14ml/hr q 8 hours until goal rate is reached.   Propofol: 24.0 ml/hr- provides 634kcal/day   Pro-Source TF 4ml daily via tube   Free water flushes 92ml q4 hours to maintain tube patency   Regimen provides 1480kcal/day, 137g/day protein and 1314ml/day free water- with propofol provides 2114kcal/day.   Liquid MVI daily via tube   Pt at high refeed risk; recommend monitor potassium, magnesium and phosphorus labs daily until stable  NUTRITION DIAGNOSIS:   Inadequate oral intake related to inability to eat (pt sedated and ventilated) as evidenced by NPO status.  GOAL:   Provide needs based on ASPEN/SCCM guidelines  MONITOR:   Vent status,Labs,Weight trends,Skin,I & O's  REASON FOR ASSESSMENT:   Ventilator    ASSESSMENT:   34 year old male with a past medical history significant for paroxysmal atrial fibrillation on Eliquis for 4 weeks, chronic kidney disease, substance abuse and hypertension who presented to [facility] ED on 03/07/2020 following an out of hospital V. fib cardiac arrest.   Pt sedated and ventilated. OGT in place. Pt on cooling protocol. Plan is to initiate tube feeds today. Pt is at refeed risk.   Per chart, pt appears weight stable pta.   Medications reviewed and include: NaCl @75ml /hr, dopamine, epinephrine, pepcid, fentanyl, heparin, propofol  Labs reviewed: K 4.8 wnl, BUN 29(H), creat 2.64(H), P 4.9(H), Mg 2.3 wnl Wbc- 13.3(H)  Patient is currently intubated on ventilator support MV: 10.2 L/min Temp (24hrs), Avg:96.5 F (35.8 C), Min:93.56 F (34.2 C), Max:99.14 F (37.3 C)  Propofol: 24.0 ml/hr- provides 634kcal/day   MAP- >10mmHg  UOP- 196ml  NUTRITION - FOCUSED PHYSICAL EXAM: Unable to perform at this time   Diet Order:   Diet Order            Diet NPO time specified  Diet effective now                 EDUCATION NEEDS:   No education needs have been identified at this time  Skin:  Skin Assessment: Reviewed RN Assessment  Last BM:  pta  Height:   Ht Readings from Last 1 Encounters:  03/11/20 5\' 7"  (1.702 m)    Weight:   Wt Readings from Last 1 Encounters:  03/08/2020 106.2 kg    Ideal Body Weight:  67.2 kg  BMI:  Body mass index is 36.67 kg/m.  Estimated Nutritional Needs:   Kcal:  1170-1489kcal/day  Protein:  >135g/day  Fluid:  2.0L/day  Koleen Distance MS, RD, LDN Please refer to [facility] for RD and/or RD on-call/weekend/after hours pager

## 2020-03-14 NOTE — Progress Notes (Signed)
Amio gtt restarted at lower rate per cardiology recommendation this am. Pt temp is uptrending despite arctic sun cooling measures. Additional ice packs to groin and armpits, room to lowest temp,

## 2020-03-14 NOTE — Progress Notes (Signed)
Pt shivering from cooling measures this am, appears pt spiking fever underlying as temp increasing despite arctic sun. Nimbex gtt started to stop shivering. Will discontinue asap now that rewarming has started.

## 2020-03-14 NOTE — Progress Notes (Signed)
ANTICOAGULATION CONSULT NOTE  Pharmacy Consult for heparin Indication: chest pain/ACS  Patient Measurements: Heparin Dosing Weight: 88 kg  Labs: Recent Labs    03/14/2020 0700 02/22/2020 0903 02/29/2020 1115 03/12/2020 1237 02/17/2020 1708 02/19/2020 2043 03/14/20 0334 03/14/20 0948  HGB 15.9  --   --   --   --   --  14.0  --   HCT 47.2  --   --   --   --   --  42.6  --   PLT 254  --   --   --   --   --  263  --   APTT 30  --  33  --   --  85* 99* 71*  LABPROT 14.7  --  15.0  --   --   --   --   --   INR 1.2  --  1.2  --   --   --   --   --   HEPARINUNFRC  --   --  1.79*  --   --   --  1.40*  --   CREATININE 2.96*  --   --  2.94* 2.92* 2.93* 2.64*  --   TROPONINIHS 22*   < >  --  106* 126* 136*  --   --    < > = values in this interval not displayed.    Estimated Creatinine Clearance: 44.9 mL/min (A) (by C-G formula based on SCr of 2.64 mg/dL (H)).   Medical History: Past Medical History:  Diagnosis Date  . Aortic root dilatation ([place])    a. 01/2020 Echo: Ao root 4.3cm.  . CKD (chronic kidney disease)   . Depression    after death of family members  . History of chicken pox   . History of echocardiogram    a. 01/2020 Echo: EF 55-60%. No rwma. Mild LVH. Triv AI. Ao root 4.3 cm.  . History of stress test    a. 10/2017 MV: EF 40-45%, diff HK. No scar/ischemia-->intermediate risk in setting of depressed EF however, EF 55-60% by echo-->med managed.  . Hypertension   . PAF (paroxysmal atrial fibrillation) (HCC)    a. CHA2DS2VASc = 1-->eliquis.  . Substance abuse [facility])    h/o cocaine abuse     Assessment: 34 year old male s/p cardiac arrest with ROSC requiring multiple defibrillations and epinephrine. Patient was on his way to an appointment for cardioversion for persistent afib. Patient does take Eliquis PTA. Due to nature of presentation, post-arrest and now on Code Cool, difficult to assess last dose of Eliquis PTA. Likely this morning or last night.   Heparin course: 12/29  at 1508 Heparin 2000 unit IV bolus x 1 and drip started at 1150 units/hr 12/29 at 2043 aPTT = 85, therapeutic x 1. Heparin drip continued at 1150 units/hr 12/30 at 0334 aPTT = 99, therapeutic x 2. Heparin drip continued at 1150 units/hr 12/30 at 0948 aPTT = 71, therapeutic x 3. Heparin drip continued at 1150 units/hr  Goal of Therapy:  Heparin level 0.3-0.7 units/ml aPTT 66-102 seconds Monitor platelets by anticoagulation protocol: Yes   Plan:  --aPTT is therapeutic x 3. Will continue heparin infusion at current rate of 1150 units/hr --Re-check aPTT and HL tomorrow AM. Once HL and aPTT correlate can switch over to HL monitoring --CBC daily per protocol  Benita Gutter 03/14/2020 11:18 AM

## 2020-03-14 NOTE — Progress Notes (Signed)
Pt is on hypothermia protocol. Currently resting on ventilator. Pupils are 3 and sluggish does not follow commands. Pt is resting comfortably and is no apparent distress. Will continue to observe closely.

## 2020-03-14 NOTE — Progress Notes (Deleted)
03/14/2020  I have seen and evaluated the patient for post cardiac arrest care.  S:  No events, some shivering despite sedation requiring NMB.  Weaned off pressors.  O: Blood pressure 101/71, pulse (!) 55, temperature (!) 95.9 F (35.5 C), resp. rate 20, weight 106.2 kg, SpO2 100 %.  Unresponsive man on vent Thick secretions from ETT Transmitted upper airway sounds, passive on vent Ext cool to touch Sedated/paralyzed  BUN/Cr slightly better WBC improved CXR clear  A:  OOH Vfib arrest, 7 mins downtime on TTM; unclear etiology Post arrest cardiogenic shock improved with reduced pressor needs Substance abuse, only marijuana positive on utox Hx Afib, prior cardioversion CKD at baseline  P:  Continue TTM protocol, to rewarm today Lighten sedation once back to 37C, usually wait 72 hours post rewarming for full prognostication Amiodarone per cardiology Further ischemic workup pending neurological recovery  The patient is critically ill with multiple organ systems failure and requires high complexity decision making for assessment and support, frequent evaluation and titration of therapies, application of advanced monitoring technologies and extensive interpretation of multiple databases. Critical Care Time devoted to patient care services described in this note independent of APP/resident  time is 35 minutes.   03/14/2020 Erskine Emery MD

## 2020-03-14 NOTE — Consult Note (Signed)
[facility] CONSULT NOTE    Date: 03/14/2020                  Patient Name:  Jeremiah Keith  MRN: [id number]  DOB: 16-Sep-1985  Age / Sex: 34 y.o., male         PCP: [name], MD                 Service Requesting Consult:  Critical care                 Reason for Consult:  Acute kidney injury/chronic kidney disease stage IIIb            History of Present Illness: Patient is a 34 y.o. male with a PMHx of aortic root dilatation, chronic kidney disease stage IIIb, depression, congestive heart failure ejection fraction 20 to 25%, hypertension, paroxysmal atrial fibrillation, history of cocaine use, who was admitted to [facility] on 02/21/2020 for evaluation of cardiac arrest.  Patient had recent diagnosis of atrial fibrillation with rapid ventricular response.  He was under anticoagulation.  He was to have had DC cardioversion on the day of admission.  However while on route to the hospital patient became unconscious.  EMS subsequently called and patient found to be in ventricular fibrillation.  ACLS was initiated.  Estimated time of collapse to CPR was approximately 4 minutes.  We are asked to see the patient for evaluation management of acute kidney injury.  Urine output was 1.9 L over the preceding 24 hours.  The patient's baseline creatinine is 2.2 with an EGFR of 38.  Creatinine currently is 2.64.  Patient was on hypothermia protocol and is to undergo rewarming.   Medications: Outpatient medications: Medications Prior to Admission  Medication Sig Dispense Refill Last Dose  . apixaban (ELIQUIS) 5 MG TABS tablet Take 1 tablet (5 mg total) by mouth 2 (two) times daily. 60 tablet 11 02/16/2020 at 0600  . carvedilol (COREG) 25 MG tablet Take 1 tablet (25 mg total) by mouth 2 (two) times daily with a meal. 60 tablet 11 03/11/2020 at 0600  . diltiazem (CARDIZEM CD) 300 MG 24 hr capsule Take 300 mg by mouth daily.   02/15/2020 at 0600  . irbesartan (AVAPRO)  150 MG tablet Take 1 tablet (150 mg total) by mouth daily. 90 tablet 11 02/27/2020 at Unknown time  . diltiazem (CARDIZEM CD) 360 MG 24 hr capsule Take 1 capsule (360 mg total) by mouth daily. 30 capsule 5   . torsemide (DEMADEX) 20 MG tablet Take 1 tablet (20 mg total) by mouth daily. 30 tablet 5     Current medications: Current Facility-Administered Medications  Medication Dose Route Frequency Provider Last Rate Last Admin  . amiodarone (NEXTERONE PREMIX) 360-4.14 MG/200ML-% (1.8 mg/mL) IV infusion  30 mg/hr Intravenous Continuous Lavonia Drafts, MD 16.67 mL/hr at 03/14/20 1254 30 mg/hr at 03/14/20 1254  . artificial tears (LACRILUBE) ophthalmic ointment 1 application  1 application Both Eyes T7D [name] D, NP   1 application at 22/02/54 0510  . atropine 1 MG/10ML injection 1 mg  1 mg Intravenous PRN Awilda Bill, NP      . chlorhexidine gluconate (MEDLINE KIT) (PERIDEX) 0.12 % solution 15 mL  15 mL Mouth Rinse BID Awilda Bill, NP   15 mL at 03/14/20 0802  . Chlorhexidine Gluconate Cloth 2 % PADS 6 each  6 each Topical Q0600 Tyna Jaksch, MD      . Chlorhexidine  Gluconate Cloth 2 % PADS 6 each  6 each Topical Q0600 [name], MD   6 each at 03/14/20 1239  . cisatracurium (NIMBEX) 200 mg in sodium chloride 0.9 % 100 mL (2 mg/mL) infusion  1-1.5 mcg/kg/min Intravenous Continuous [name] D, NP 3.02 mL/hr at 03/14/20 1254 1 mcg/kg/min at 03/14/20 1254   And  . cisatracurium (NIMBEX) bolus via infusion 5 mg  0.05 mg/kg Intravenous PRN [name] D, NP      . dextrose 5 %-0.9 % sodium chloride infusion   Intravenous Continuous Bradly Bienenstock, NP 75 mL/hr at 03/14/20 1254 Infusion Verify at 03/14/20 1254  . dextrose 50 % solution 25 mL  25 mL Intravenous PRN Candee Furbish, MD   25 mL at 03/14/20 1113  . DOPamine (INTROPIN) 800 mg in dextrose 5 % 250 mL (3.2 mg/mL) infusion  0-20 mcg/kg/min Intravenous Titrated Awilda Bill, NP      . EPINEPHrine  (ADRENALIN) 4 mg in NS 250 mL (0.016 mg/mL) premix infusion  0.5-20 mcg/min Intravenous Titrated Bradly Bienenstock, NP   Stopped at 03/14/20 [phone]  . famotidine (PEPCID) IVPB 20 mg premix  20 mg Intravenous Q12H Bradly Bienenstock, NP   Stopped at 03/14/20 1121  . [START ON 03/15/2020] feeding supplement (PROSource TF) liquid 45 mL  45 mL Per Tube Daily Candee Furbish, MD      . feeding supplement (VITAL HIGH PROTEIN) liquid 1,000 mL  1,000 mL Per Tube Continuous Candee Furbish, MD      . fentaNYL (SUBLIMAZE) bolus via infusion 50 mcg  50 mcg Intravenous Q15 min PRN Lavonia Drafts, MD      . fentaNYL (SUBLIMAZE) bolus via infusion 50 mcg  50 mcg Intravenous Q30 min PRN [name] D, NP      . fentaNYL 2559mg in NS 2550m(1084mml) infusion-PREMIX  100-300 mcg/hr Intravenous Continuous KeeDarel Hong NP 15 mL/hr at 03/14/20 1254 150 mcg/hr at 03/14/20 1254  . heparin ADULT infusion 100 units/mL (25000 units/250m76m1,150 Units/hr Intravenous Continuous IzquTyna Jaksch 11.5 mL/hr at 03/14/20 1254 1,150 Units/hr at 03/14/20 1254  . insulin aspart (novoLOG) injection 0-9 Units  0-9 Units Subcutaneous Q4H KeenDarel HongNP      . MEDLINE mouth rinse  15 mL Mouth Rinse 10 times per day BlakAwilda Bill   15 mL at 03/14/20 1138  . midazolam (VERSED) 50 mg/50 mL (1 mg/mL) premix infusion  2 mg/hr Intravenous Continuous KinnLavonia Drafts   Held at 03/08/[phone][START ON 03/15/2020] multivitamin liquid 15 mL  15 mL Per Tube Daily SmitCandee Furbish      . propofol (DIPRIVAN) 1000 MG/100ML infusion  5-80 mcg/kg/min Intravenous Continuous KinnLavonia Drafts 30.2 mL/hr at 03/14/20 1446 50 mcg/kg/min at 03/14/20 1446      Allergies: Allergies  Allergen Reactions  . Nsaids     Chronic kidney disease  . Penicillins Rash    Has patient had a PCN reaction causing immediate rash, facial/tongue/throat swelling, SOB or lightheadedness with hypotension: Yes Has patient had a PCN  reaction causing severe rash involving mucus membranes or skin necrosis: No Has patient had a PCN reaction that required hospitalization: No Has patient had a PCN reaction occurring within the last 10 years: No If all of the above answers are "NO", then may proceed with Cephalosporin use.       Past Medical History: Past Medical History:  Diagnosis Date  .  Aortic root dilatation ([place])    a. 01/2020 Echo: Ao root 4.3cm.  . CKD (chronic kidney disease)   . Depression    after death of family members  . History of chicken pox   . History of echocardiogram    a. 01/2020 Echo: EF 55-60%. No rwma. Mild LVH. Triv AI. Ao root 4.3 cm.  . History of stress test    a. 10/2017 MV: EF 40-45%, diff HK. No scar/ischemia-->intermediate risk in setting of depressed EF however, EF 55-60% by echo-->med managed.  . Hypertension   . PAF (paroxysmal atrial fibrillation) (HCC)    a. CHA2DS2VASc = 1-->eliquis.  . Substance abuse [facility])    h/o cocaine abuse     Past Surgical History: Past Surgical History:  Procedure Laterality Date  . CARDIOVERSION N/A 02/18/2020   Procedure: CARDIOVERSION;  Surgeon: [name], MD;  Location: [facility] ORS;  Service: Cardiovascular;  Laterality: N/A;  . TONSILLECTOMY AND ADENOIDECTOMY  1990     Family History: Family History  Problem Relation Age of Onset  . Heart disease Mother   . Diabetes Father   . Hypertension Father   . Prostate cancer Maternal Grandfather        possible prostate cancer  . Colon cancer Neg Hx      Social History: Social History   Socioeconomic History  . Marital status: Married    Spouse name: Not on file  . Number of children: Not on file  . Years of education: 73  . Highest education level: Not on file  Occupational History  . Not on file  Tobacco Use  . Smoking status: Former Smoker    Packs/day: 25.00    Types: Cigarettes    Quit date: 02/10/2020    Years since quitting: 0.0  . Smokeless tobacco: Never Used   Substance and Sexual Activity  . Alcohol use: Yes    Alcohol/week: 0.0 standard drinks  . Drug use: Not Currently  . Sexual activity: Not on file  Other Topics Concern  . Not on file  Social History Narrative   [employer] fan   Married 2016, 2 stepkids, 1 biological son.    Works at [employer] Strain: Not on file  Food Insecurity: Not on file  Transportation Needs: Not on file  Physical Activity: Not on file  Stress: Not on file  Social Connections: Not on file  Intimate Partner Violence: Not on file     Review of Systems: Patient unable to provide review of systems as he is intubated and sedated  Vital Signs: Blood pressure 101/71, pulse (!) 55, temperature (!) 95.9 F (35.5 C), resp. rate 20, weight 106.2 kg, SpO2 100 %.  Weight trends: Filed Weights   02/17/2020 1027  Weight: 106.2 kg    Physical Exam: Physical Exam: General:  Critically ill-appearing  Head:  Normocephalic, atraumatic.  Endotracheal tube in place  Eyes:  Anicteric  Neck:  Supple  Lungs:   Clear to auscultation, vent assisted  Heart:  S1S2 irregular  Abdomen:   Soft, nontender, bowel sounds present  Extremities:  Trace peripheral edema.  Neurologic:  Intubated, sedated  Skin:  No lesions  Access:  No hemodialysis access at the moment    Lab results: Basic Metabolic Panel: Recent Labs  Lab 02/14/2020 1708 02/27/2020 2043 03/14/20 0334  NA 141 142 141  K 4.1 4.9 4.8  CL 109 109 110  CO2 21*  20* 21*  GLUCOSE 217* 117* 122*  BUN 33* 32* 29*  CREATININE 2.92* 2.93* 2.64*  CALCIUM 7.9* 8.7* 8.8*  MG  --   --  2.3  PHOS  --   --  4.9*    Liver Function Tests: Recent Labs  Lab 02/21/2020 0700 02/18/2020 1708  AST 28 22  ALT 19 23  ALKPHOS 72 65  BILITOT 1.0 0.6  PROT 6.8 5.7*  ALBUMIN 3.9 3.3*   No results for input(s): LIPASE, AMYLASE in the last 168 hours. No results for input(s): AMMONIA in the last 168  hours.  CBC: Recent Labs  Lab 03/11/20 0948 03/14/2020 0700 03/14/20 0334  WBC 9.4 17.2* 13.3*  NEUTROABS  --  10.2*  --   HGB 17.1 15.9 14.0  HCT 48.6 47.2 42.6  MCV 88 91.7 93.0  PLT 221 254 263    Cardiac Enzymes: No results for input(s): CKTOTAL, CKMB, CKMBINDEX, TROPONINI in the last 168 hours.  BNP: Invalid input(s): POCBNP  CBG: Recent Labs  Lab 03/14/20 0559 03/14/20 0712 03/14/20 1104 03/14/20 1108 03/14/20 1135  GLUCAP 80 79 52* 66* 127*    Microbiology: Results for orders placed or performed during the hospital encounter of 02/27/2020  Resp Panel by RT-PCR (Flu A&B, Covid) Nasopharyngeal Swab     Status: None   Collection Time: 03/11/2020  7:00 AM   Specimen: Nasopharyngeal Swab; Nasopharyngeal(NP) swabs in vial transport medium  Result Value Ref Range Status   SARS Coronavirus 2 by RT PCR NEGATIVE NEGATIVE Final    Comment: (NOTE) SARS-CoV-2 target nucleic acids are NOT DETECTED.  The SARS-CoV-2 RNA is generally detectable in upper respiratory specimens during the acute phase of infection. The lowest concentration of SARS-CoV-2 viral copies this assay can detect is 138 copies/mL. A negative result does not preclude SARS-Cov-2 infection and should not be used as the sole basis for treatment or other patient management decisions. A negative result may occur with  improper specimen collection/handling, submission of specimen other than nasopharyngeal swab, presence of viral mutation(s) within the areas targeted by this assay, and inadequate number of viral copies(<138 copies/mL). A negative result must be combined with clinical observations, patient history, and epidemiological information. The expected result is Negative.  Fact Sheet for Patients:  EntrepreneurPulse.com.au  Fact Sheet for Healthcare Providers:  IncredibleEmployment.be  This test is no t yet approved or cleared by the [place] FDA and  has been  authorized for detection and/or diagnosis of SARS-CoV-2 by FDA under an Emergency Use Authorization (EUA). This EUA will remain  in effect (meaning this test can be used) for the duration of the COVID-19 declaration under Section 564(b)(1) of the Act, 21 U.S.C.section 360bbb-3(b)(1), unless the authorization is terminated  or revoked sooner.       Influenza A by PCR NEGATIVE NEGATIVE Final   Influenza B by PCR NEGATIVE NEGATIVE Final    Comment: (NOTE) The Xpert Xpress SARS-CoV-2/FLU/RSV plus assay is intended as an aid in the diagnosis of influenza from Nasopharyngeal swab specimens and should not be used as a sole basis for treatment. Nasal washings and aspirates are unacceptable for Xpert Xpress SARS-CoV-2/FLU/RSV testing.  Fact Sheet for Patients: EntrepreneurPulse.com.au  Fact Sheet for Healthcare Providers: IncredibleEmployment.be  This test is not yet approved or cleared by the [place] FDA and has been authorized for detection and/or diagnosis of SARS-CoV-2 by FDA under an Emergency Use Authorization (EUA). This EUA will remain in effect (meaning this test can be used) for the duration  of the COVID-19 declaration under Section 564(b)(1) of the Act, 21 U.S.C. section 360bbb-3(b)(1), unless the authorization is terminated or revoked.  Performed at [facility], [address] [place], [place] [address]   Blood Culture (routine x 2)     Status: None (Preliminary result)   Collection Time: 03/08/2020  7:08 AM   Specimen: BLOOD  Result Value Ref Range Status   Specimen Description BLOOD LEFT [facility]  Final   Special Requests   Final    BOTTLES DRAWN AEROBIC AND ANAEROBIC Blood Culture adequate volume   Culture   Final    NO GROWTH 1 DAY Performed at [facility], [address], [place], [place] [address]    Report Status PENDING  Incomplete  Blood Culture (routine x 2)     Status: None (Preliminary result)    Collection Time: 02/28/2020  7:08 AM   Specimen: BLOOD  Result Value Ref Range Status   Specimen Description BLOOD RIGHT HAND  Final   Special Requests   Final    BOTTLES DRAWN AEROBIC AND ANAEROBIC Blood Culture adequate volume   Culture   Final    NO GROWTH < 24 HOURS Performed at [facility], [address], [place], [place] [address]    Report Status PENDING  Incomplete  Urine culture     Status: None   Collection Time: 03/12/2020  7:10 AM   Specimen: In/Out Cath Urine  Result Value Ref Range Status   Specimen Description   Final    IN/OUT CATH URINE Performed at [facility], [address], [place], [place] [address]    Special Requests   Final    NONE Performed at [facility], [address], [place], [place] [address]    Culture   Final    NO GROWTH Performed at [facility], [place] [address], [place], [place] [address]    Report Status 03/14/2020 FINAL  Final  MRSA PCR Screening     Status: None   Collection Time: 02/21/2020 10:43 AM   Specimen: Nasopharyngeal  Result Value Ref Range Status   MRSA by PCR NEGATIVE NEGATIVE Final    Comment:        The GeneXpert MRSA Assay (FDA approved for NASAL specimens only), is one component of a comprehensive MRSA colonization surveillance program. It is not intended to diagnose MRSA infection nor to guide or monitor treatment for MRSA infections. Performed at [facility], [address] [place], [place] [address]     Coagulation Studies: Recent Labs    02/25/2020 0700 03/11/2020 1115  LABPROT 14.7 15.0  INR 1.2 1.2    Urinalysis: Recent Labs    03/11/2020 0710  COLORURINE YELLOW*  LABSPEC 1.014  PHURINE 5.0  GLUCOSEU 50*  HGBUR SMALL*  BILIRUBINUR NEGATIVE  KETONESUR NEGATIVE  PROTEINUR >=300*  NITRITE NEGATIVE  LEUKOCYTESUR NEGATIVE      Imaging: DG Abdomen 1 View  Result Date: 02/17/2020 CLINICAL DATA:  34 year old male status post cardiac arrest and  resuscitation. EXAM: ABDOMEN - 1 VIEW COMPARISON:  Portable chest from the same time today and earlier. FINDINGS: Portable AP supine view at 0719 hours. Enteric tube terminates in the stomach with side hole at the level of the gastric fundus. Moderate gas distention of the stomach. Other visible bowel gas pattern appears normal. Mild motion artifact at the lung bases which appear negative. No osseous abnormality identified. IMPRESSION: Enteric tube terminates in the stomach, side hole at the gastric fundus.  Moderate gastric distension. Electronically Signed   By: Genevie Ann M.D.   On: 03/05/2020 07:55   CT Head Wo Contrast  Result Date: 02/19/2020 CLINICAL DATA:  Altered mental status. EXAM: CT HEAD WITHOUT CONTRAST TECHNIQUE: Contiguous axial images were obtained from the base of the skull through the vertex without intravenous contrast. COMPARISON:  CT head 12/08/2015 FINDINGS: Brain: No evidence of acute infarction, hemorrhage, hydrocephalus, extra-axial collection or mass lesion/mass effect. Mild hypodensity in the cerebral white matter right greater than left without mass-effect. Vascular: Negative for hyperdense vessel Skull: Negative Sinuses/Orbits: Paranasal sinuses clear.  Negative orbit Other: None IMPRESSION: Mild hypodensity in the frontal white matter right greater than left, not present in 2017. Nonspecific appearance. Possible chronic ischemia however given the patient's age consider MRI of the brain without and with contrast for further evaluation Electronically Signed   By: Franchot Gallo M.D.   On: 02/18/2020 08:12   DG Chest Port 1 View  Result Date: 03/14/2020 CLINICAL DATA:  Acute respiratory failure EXAM: PORTABLE CHEST 1 VIEW COMPARISON:  02/22/2020 FINDINGS: Endotracheal tube seen 5.6 cm above the carina. Nasogastric tube extends into the left upper quadrant of the abdomen overlying the proximal body of the stomach. The lungs are symmetrically well expanded and are clear. No  pneumothorax or pleural effusion. Cardiac size within normal limits. Pulmonary vascularity is normal. No acute bone abnormality. IMPRESSION: Stable examination. Stable support tubes. No focal pulmonary infiltrate. Electronically Signed   By: Fidela Salisbury MD   On: 03/14/2020 06:00   DG Chest Port 1 View  Result Date: 03/12/2020 CLINICAL DATA:  Acute respiratory failure with hypoxia. EXAM: PORTABLE CHEST 1 VIEW COMPARISON:  Earlier film, same date. FINDINGS: The endotracheal tube is now 2.4 cm above the carina. The NG tube is coursing down the esophagus and into the stomach. External pacer paddles are noted. The heart is within normal limits in size given the AP projection, portable technique and supine position of the patient. The lungs are clear of an acute process. No pleural effusions or pneumothorax. IMPRESSION: 1. The endotracheal tube is now 2.4 cm above the carina. 2. No acute cardiopulmonary findings. Electronically Signed   By: Marijo Sanes M.D.   On: 02/28/2020 15:11   DG Chest Port 1 View  Result Date: 03/14/2020 CLINICAL DATA:  Encounter for intubation. EXAM: PORTABLE CHEST 1 VIEW COMPARISON:  Radiographs 02/15/2020 and 02/09/2020.  CT 04/12/2014. FINDINGS: 1051 hours. Tip of the endotracheal tube is just below the thoracic inlet, approximately 6.5 cm above the carina. Enteric tube is in place, not well visualized, but projecting below the diaphragm. The heart size and mediastinal contours are stable. The lungs are clear. There is no pleural effusion or pneumothorax. No acute osseous findings are evident. IMPRESSION: Endotracheal tube tip just below the thoracic inlet. Enteric tube extends below the diaphragm. No acute cardiopulmonary process. Electronically Signed   By: Richardean Sale M.D.   On: 02/21/2020 11:19   DG Chest Port 1 View  Result Date: 02/29/2020 CLINICAL DATA:  34 year old male status post cardiac arrest and resuscitation. Query sepsis. EXAM: PORTABLE CHEST 1 VIEW  COMPARISON:  02/09/2020 chest radiographs and earlier. FINDINGS: Portable AP supine view at 0718 hours. Intubated. Endotracheal tube tip in good position between the level the clavicles and carina. Enteric tube courses to the abdomen, tip not included. Partially visible gas in the stomach. Resuscitation pads project over the lower chest. Somewhat low lung volumes. Mediastinal contours remain within normal limits. Allowing for portable technique  the lungs are clear. No osseous abnormality identified. IMPRESSION: 1. ET tube and enteric tube appear satisfactory. 2. Low lung volumes with no acute cardiopulmonary abnormality. Electronically Signed   By: Genevie Ann M.D.   On: 03/14/2020 07:54   EEG adult  Result Date: 02/28/2020 Lora Havens, MD     02/25/2020  5:17 PM Patient Name: JIMIE KUWAHARA MRN: [id number] Epilepsy Attending: Lora Havens Referring Physician/Provider: Dr [name], NP Date: 02/14/2020 Duration: 25.05 mins Patient history: 34yo M s/p cardiac arrest. EEG to evaluate for seizure Level of alertness: comatose AEDs during EEG study: Propofol Technical aspects: This EEG study was done with scalp electrodes positioned according to the 10-20 International system of electrode placement. Electrical activity was acquired at a sampling rate of 500Hz and reviewed with a high frequency filter of 70Hz and a low frequency filter of 1Hz. EEG data were recorded continuously and digitally stored. Description: EEG showed burst suppression with bursts of generalized low amplitude 3-5hz theta-delta slowing lasting 3-5seconds and generalized suppression lasting 4-6 seconds. EEG was not reactive to tactile stimulation. Hyperventilation and photic stimulation were not performed.   ABNORMALITY -Burst suppression, generalized IMPRESSION: This study is suggestive of profoud diffuse encephalopathy, nonspecific etiology but likely related to sedation, anoxic/hypoxic brain injury. No seizures or epileptiform  discharges were seen throughout the recording. Lora Havens   ECHOCARDIOGRAM COMPLETE  Result Date: 02/16/2020    ECHOCARDIOGRAM REPORT   Patient Name:   MAREK NGHIEM Date of Exam: 02/17/2020 Medical Rec #:  [id number]             Height:       67.0 in Accession #:    [id number]            Weight:       222.0 lb Date of Birth:  Oct 19, 1985            BSA:          2.114 m Patient Age:    14 years              BP:           93/80 mmHg Patient Gender: M                     HR:           61 bpm. Exam Location:  [facility] Procedure: 2D Echo, Color Doppler, Cardiac Doppler and Intracardiac            Opacification Agent STAT ECHO Indications:     I46.9 Cardiac arrest  History:         Patient has prior history of Echocardiogram examinations, most                  recent 02/09/2020. CKD, Arrythmias:Atrial Fibrillation; Risk                  Factors:Hypertension.  Sonographer:     Charmayne Sheer RDCS (AE) Referring Phys:  [id number] [name] Diagnosing Phys: [name] MD  Sonographer Comments: Suboptimal apical window and echo performed with patient supine and on artificial respirator. Image acquisition challenging due to patient body habitus. IMPRESSIONS  1. Left ventricular ejection fraction, by estimation, is 20 to 25%. The left ventricle has severely decreased function. The left ventricle demonstrates global hypokinesis. There is mild left ventricular hypertrophy. Left ventricular diastolic parameters  are consistent with Grade I diastolic dysfunction (impaired relaxation).  2. Right ventricular systolic function  is severely reduced. The right ventricular size is normal.  3. The mitral valve is normal in structure. No evidence of mitral valve regurgitation.  4. The aortic valve is normal in structure. Aortic valve regurgitation is trivial.  5. Aortic dilatation noted. There is mild dilatation of the aortic root, measuring 42 mm. FINDINGS  Left Ventricle: Left ventricular ejection fraction, by  estimation, is 20 to 25%. The left ventricle has severely decreased function. The left ventricle demonstrates global hypokinesis. Definity contrast agent was given IV to delineate the left ventricular endocardial borders. The left ventricular internal cavity size was normal in size. There is mild left ventricular hypertrophy. Left ventricular diastolic parameters are consistent with Grade I diastolic dysfunction (impaired relaxation). Right Ventricle: The right ventricular size is normal. No increase in right ventricular wall thickness. Right ventricular systolic function is severely reduced. Left Atrium: Left atrial size was normal in size. Right Atrium: Right atrial size was normal in size. Pericardium: There is no evidence of pericardial effusion. Mitral Valve: The mitral valve is normal in structure. No evidence of mitral valve regurgitation. MV peak gradient, 1.6 mmHg. The mean mitral valve gradient is 1.0 mmHg. Tricuspid Valve: The tricuspid valve is normal in structure. Tricuspid valve regurgitation is not demonstrated. Aortic Valve: The aortic valve is normal in structure. Aortic valve regurgitation is trivial. Aortic valve mean gradient measures 1.0 mmHg. Aortic valve peak gradient measures 2.5 mmHg. Aortic valve area, by VTI measures 2.44 cm. Pulmonic Valve: The pulmonic valve was normal in structure. Pulmonic valve regurgitation is not visualized. Aorta: Aortic dilatation noted. There is mild dilatation of the aortic root, measuring 42 mm. Venous: IVC assessment for right atrial pressure unable to be performed due to mechanical ventilation. IAS/Shunts: No atrial level shunt detected by color flow Doppler.  LEFT VENTRICLE PLAX 2D LVIDd:         6.26 cm      Diastology LVIDs:         5.11 cm      LV e' medial:    5.66 cm/s LV PW:         1.28 cm      LV E/e' medial:  8.6 LV IVS:        0.93 cm      LV e' lateral:   4.68 cm/s LVOT diam:     2.60 cm      LV E/e' lateral: 10.4 LV SV:         34 LV SV Index:    16 LVOT Area:     5.31 cm  LV Volumes (MOD) LV vol d, MOD A2C: 91.7 ml LV vol d, MOD A4C: 128.0 ml LV vol s, MOD A2C: 62.0 ml LV vol s, MOD A4C: 76.8 ml LV SV MOD A2C:     29.7 ml LV SV MOD A4C:     128.0 ml LV SV MOD BP:      39.1 ml RIGHT VENTRICLE RV Basal diam:  3.80 cm LEFT ATRIUM             Index       RIGHT ATRIUM           Index LA diam:        3.20 cm 1.51 cm/m  RA Area:     16.70 cm LA Vol (A2C):   62.5 ml 29.57 ml/m RA Volume:   45.20 ml  21.38 ml/m LA Vol (A4C):   34.6 ml 16.37 ml/m LA Biplane Vol: 47.0 ml 22.23  ml/m  AORTIC VALVE                   PULMONIC VALVE AV Area (Vmax):    3.30 cm    PV Vmax:       0.42 m/s AV Area (Vmean):   3.25 cm    PV Vmean:      32.300 cm/s AV Area (VTI):     2.44 cm    PV VTI:        0.074 m AV Vmax:           79.70 cm/s  PV Peak grad:  0.7 mmHg AV Vmean:          52.100 cm/s PV Mean grad:  0.0 mmHg AV VTI:            0.139 m AV Peak Grad:      2.5 mmHg AV Mean Grad:      1.0 mmHg LVOT Vmax:         49.60 cm/s LVOT Vmean:        31.900 cm/s LVOT VTI:          0.064 m LVOT/AV VTI ratio: 0.46  AORTA Ao Root diam: 4.20 cm MITRAL VALVE               TRICUSPID VALVE MV Area (PHT): 3.26 cm    TR Peak grad:   16.6 mmHg MV Peak grad:  1.6 mmHg    TR Vmax:        204.00 cm/s MV Mean grad:  1.0 mmHg MV Vmax:       0.64 m/s    SHUNTS MV Vmean:      37.1 cm/s   Systemic VTI:  0.06 m MV Decel Time: 233 msec    Systemic Diam: 2.60 cm MV E velocity: 48.80 cm/s MV A velocity: 29.60 cm/s MV E/A ratio:  1.65 [name] MD Electronically signed by [name] MD Signature Date/Time: 02/17/2020/12:58:31 PM    Final       Assessment & Plan: Pt is a 34 y.o. male with a PMHx of aortic root dilatation, chronic kidney disease stage IIIb, depression, congestive heart failure ejection fraction 20 to 25%, hypertension, paroxysmal atrial fibrillation, history of cocaine use, who was admitted to [facility] on 02/21/2020 for evaluation of cardiac arrest.   1.  Acute kidney  injury/chronic kidney disease stage IIIb baseline creatinine 2.2 with an EGFR of 38.  Suspect acute kidney injury now is related to cardiac arrest and subsequent renal ischemia.  However patient making good urine at 1.8 L over the preceding 24 hours.  Therefore no immediate need for dialysis at the moment.  Continue supportive care for now and avoid nephrotoxins as possible.  2.  Acute respiratory failure/ventricular fibrillation cardiac arrest.  Patient went unconscious on the way to DC cardioversion.  Approximately 4 minutes until EMS arrived.  6 minutes until return of spontaneous circulation.  Subsequently placed on hypothermia protocol and now being rewarmed.  Follow neurologic status.  3.  Further plan as patient progresses.  Thanks for consultation.

## 2020-03-14 NOTE — Plan of Care (Signed)
Pt HR and BP stable today as rewarming ongoing. Able to wean off epi gtt this morning, pt off pressors since. Pt did have underlying fever spike that prompted cool temperatures on [place] sun water; started nimbex paralytic infusion at low /shivering dose as pt was shivering at this time. Nimbex since turned off again as water temps raised this afternoon/evening with resolution of fever and new temp goal of 37. Mom and Dad both at bedside during day, full updates provided by critical care and cardiology teams.   Problem: Education: Goal: Knowledge of General Education information will improve Description: Including pain rating scale, medication(s)/side effects and non-pharmacologic comfort measures Outcome: Not Progressing   Problem: Health Behavior/Discharge Planning: Goal: Ability to manage health-related needs will improve Outcome: Not Progressing   Problem: Activity: Goal: Risk for activity intolerance will decrease Outcome: Not Progressing   Problem: Clinical Measurements: Goal: Ability to maintain clinical measurements within normal limits will improve Outcome: Progressing Goal: Will remain free from infection Outcome: Progressing Goal: Diagnostic test results will improve Outcome: Progressing Goal: Respiratory complications will improve Outcome: Progressing Goal: Cardiovascular complication will be avoided Outcome: Progressing   Problem: Nutrition: Goal: Adequate nutrition will be maintained Outcome: Progressing   Problem: Pain Managment: Goal: General experience of comfort will improve Outcome: Progressing   Problem: Safety: Goal: Ability to remain free from injury will improve Outcome: Progressing   Problem: Skin Integrity: Goal: Risk for impaired skin integrity will decrease Outcome: Progressing

## 2020-03-15 ENCOUNTER — Inpatient Hospital Stay: Payer: Medicaid Other

## 2020-03-15 DIAGNOSIS — J9601 Acute respiratory failure with hypoxia: Secondary | ICD-10-CM | POA: Diagnosis not present

## 2020-03-15 DIAGNOSIS — I499 Cardiac arrhythmia, unspecified: Secondary | ICD-10-CM | POA: Diagnosis not present

## 2020-03-15 DIAGNOSIS — I5082 Biventricular heart failure: Secondary | ICD-10-CM | POA: Diagnosis not present

## 2020-03-15 DIAGNOSIS — I6349 Cerebral infarction due to embolism of other cerebral artery: Secondary | ICD-10-CM

## 2020-03-15 DIAGNOSIS — I469 Cardiac arrest, cause unspecified: Secondary | ICD-10-CM | POA: Diagnosis not present

## 2020-03-15 LAB — BLOOD GAS, ARTERIAL
Acid-base deficit: 5.7 mmol/L — ABNORMAL HIGH (ref 0.0–2.0)
Bicarbonate: 20.7 mmol/L (ref 20.0–28.0)
FIO2: 0.28
MECHVT: 500 mL
O2 Saturation: 93.8 %
PEEP: 5 cmH2O
Patient temperature: 37
RATE: 20 resp/min
pCO2 arterial: 43 mmHg (ref 32.0–48.0)
pH, Arterial: 7.29 — ABNORMAL LOW (ref 7.350–7.450)
pO2, Arterial: 78 mmHg — ABNORMAL LOW (ref 83.0–108.0)

## 2020-03-15 LAB — CBC
HCT: 37.5 % — ABNORMAL LOW (ref 39.0–52.0)
Hemoglobin: 12.3 g/dL — ABNORMAL LOW (ref 13.0–17.0)
MCH: 31.3 pg (ref 26.0–34.0)
MCHC: 32.8 g/dL (ref 30.0–36.0)
MCV: 95.4 fL (ref 80.0–100.0)
Platelets: 167 10*3/uL (ref 150–400)
RBC: 3.93 MIL/uL — ABNORMAL LOW (ref 4.22–5.81)
RDW: 13.2 % (ref 11.5–15.5)
WBC: 8.5 10*3/uL (ref 4.0–10.5)
nRBC: 0 % (ref 0.0–0.2)

## 2020-03-15 LAB — BASIC METABOLIC PANEL
Anion gap: 10 (ref 5–15)
BUN: 26 mg/dL — ABNORMAL HIGH (ref 6–20)
CO2: 20 mmol/L — ABNORMAL LOW (ref 22–32)
Calcium: 8.8 mg/dL — ABNORMAL LOW (ref 8.9–10.3)
Chloride: 111 mmol/L (ref 98–111)
Creatinine, Ser: 3.15 mg/dL — ABNORMAL HIGH (ref 0.61–1.24)
GFR, Estimated: 25 mL/min — ABNORMAL LOW (ref >60)
Glucose, Bld: 94 mg/dL (ref 70–99)
Potassium: 4.2 mmol/L (ref 3.5–5.1)
Sodium: 141 mmol/L (ref 135–145)

## 2020-03-15 LAB — GLUCOSE, CAPILLARY
Glucose-Capillary: 100 mg/dL — ABNORMAL HIGH (ref 70–99)
Glucose-Capillary: 64 mg/dL — ABNORMAL LOW (ref 70–99)
Glucose-Capillary: 65 mg/dL — ABNORMAL LOW (ref 70–99)
Glucose-Capillary: 75 mg/dL (ref 70–99)
Glucose-Capillary: 80 mg/dL (ref 70–99)
Glucose-Capillary: 93 mg/dL (ref 70–99)
Glucose-Capillary: 96 mg/dL (ref 70–99)
Glucose-Capillary: 97 mg/dL (ref 70–99)

## 2020-03-15 LAB — HEPARIN LEVEL (UNFRACTIONATED): Heparin Unfractionated: 0.68 IU/mL (ref 0.30–0.70)

## 2020-03-15 LAB — PROCALCITONIN: Procalcitonin: 0.38 ng/mL

## 2020-03-15 LAB — TRIGLYCERIDES: Triglycerides: 121 mg/dL (ref <150)

## 2020-03-15 LAB — MAGNESIUM: Magnesium: 1.9 mg/dL (ref 1.7–2.4)

## 2020-03-15 LAB — APTT: aPTT: 98 seconds — ABNORMAL HIGH (ref 24–36)

## 2020-03-15 LAB — PHOSPHORUS: Phosphorus: 3.5 mg/dL (ref 2.5–4.6)

## 2020-03-15 MED ORDER — VECURONIUM BROMIDE 10 MG IV SOLR: 10.0000 mg | Freq: Once | INTRAVENOUS | Status: AC

## 2020-03-15 MED ORDER — ASPIRIN 325 MG PO TABS: 325.0000 mg | ORAL_TABLET | Freq: Every day | ORAL | Status: DC

## 2020-03-15 MED ORDER — GADOBUTROL 1 MMOL/ML IV SOLN
10.0000 mL | Freq: Once | INTRAVENOUS | Status: AC | PRN
  Administered 2020-03-15: 10 mL via INTRAVENOUS

## 2020-03-15 MED ORDER — HEPARIN SODIUM (PORCINE) 5000 UNIT/ML IJ SOLN: 5000.0000 [IU] | Freq: Three times a day (TID) | INTRAMUSCULAR | Status: DC

## 2020-03-15 MED ORDER — FENTANYL CITRATE (PF) 100 MCG/2ML IJ SOLN
INTRAMUSCULAR | Status: AC
  Filled 2020-03-15: qty 2

## 2020-03-15 MED ORDER — ASPIRIN 325 MG PO TABS
325.0000 mg | ORAL_TABLET | Freq: Every day | ORAL | Status: DC
  Administered 2020-03-15: 325 mg via ORAL
  Filled 2020-03-15: qty 1

## 2020-03-15 MED ORDER — ATORVASTATIN CALCIUM 20 MG PO TABS
40.0000 mg | ORAL_TABLET | Freq: Every day | ORAL | Status: DC
  Administered 2020-03-15 – 2020-03-19 (×5): 40 mg
  Filled 2020-03-15 (×5): qty 2

## 2020-03-15 MED ORDER — FENTANYL CITRATE (PF) 100 MCG/2ML IJ SOLN
INTRAMUSCULAR | Status: AC
  Administered 2020-03-15: 50 ug
  Filled 2020-03-15: qty 2

## 2020-03-15 MED ORDER — VECURONIUM BROMIDE 10 MG IV SOLR
INTRAVENOUS | Status: AC
  Administered 2020-03-15: 10 mg via INTRAVENOUS
  Filled 2020-03-15: qty 10

## 2020-03-15 MED ORDER — DEXTROSE 50 % IV SOLN
25.0000 mL | Freq: Once | INTRAVENOUS | Status: AC
  Administered 2020-03-15: 25 mL via INTRAVENOUS
  Filled 2020-03-15: qty 50

## 2020-03-15 MED ORDER — HEPARIN SODIUM (PORCINE) 5000 UNIT/ML IJ SOLN
5000.0000 [IU] | Freq: Two times a day (BID) | INTRAMUSCULAR | Status: DC
  Administered 2020-03-15 – 2020-03-16 (×2): 5000 [IU] via SUBCUTANEOUS
  Filled 2020-03-15 (×2): qty 1

## 2020-03-15 NOTE — Progress Notes (Signed)
eeg done °

## 2020-03-15 NOTE — Consult Note (Signed)
Neurology Consultation Reason for Consult: Pontine Stroke Requesting Physician: Vita Barley  CC: heart attack  History is obtained from: Chart review   HPI: Jeremiah Keith is a 34 y.o. male with a past medical history significant for paroxysmal atrial fibrillation (planned for cardioversion on 12/29), poorly controlled hypertension, chronic kidney disease, recently quit tobacco use, who presented on 12/29 after V. fib arrest  Per notes in the chart, he was in route to a planned cardioversion, and his father was driving when he was noted to be unresponsive.  His father therefore pulled into a fire station where CPR was performed.  Estimated time to start CPR was about 4 minutes with ROSC obtained after about 7 minutes as well as 3 rounds of defibrillation for ventricular fibrillation.  Upon arrival to the ED he was remained unresponsive so he was intubated and placed on targeted temperature management.  He remained in sinus rhythm and was started on amiodarone for rhythm control.  U tox is positive for marijuana (some remote history of cocaine use but U tox was negative for cocaine).  He initially required vasopressor support but this was slowly weaned.  Echocardiogram is notable for new onset biventricular failure.  Head CT was notable for nonspecific hypodensity that was not noted on scan in 2017, for which MRI brain was obtained for further clarification.  This reveals a punctate area of restricted diffusion in the right motor strip as well as a larger area of restricted diffusion in the central pons, offset towards the left  Regarding atrial fibrillation, this was a new diagnosis on 02/09/2020, in the setting of 1 week of medication nonadherence.  LKW: 12/29 early morning tPA given?: No, due to out of the window Premorbid modified rankin scale:      0 - No symptoms. ROS:  Unable to obtain due to altered mental status.   Past Medical History:  Diagnosis Date  . Aortic root  dilatation ([place])    a. 01/2020 Echo: Ao root 4.3cm.  . CKD (chronic kidney disease)   . Depression    after death of family members  . History of chicken pox   . History of echocardiogram    a. 01/2020 Echo: EF 55-60%. No rwma. Mild LVH. Triv AI. Ao root 4.3 cm.  . History of stress test    a. 10/2017 MV: EF 40-45%, diff HK. No scar/ischemia-->intermediate risk in setting of depressed EF however, EF 55-60% by echo-->med managed.  . Hypertension   . PAF (paroxysmal atrial fibrillation) (HCC)    a. CHA2DS2VASc = 1-->eliquis.  . Substance abuse [facility])    h/o cocaine abuse   Past Surgical History:  Procedure Laterality Date  . CARDIOVERSION N/A 03/12/2020   Procedure: CARDIOVERSION;  Surgeon: [name], MD;  Location: [facility] ORS;  Service: Cardiovascular;  Laterality: N/A;  . TONSILLECTOMY AND ADENOIDECTOMY  1990    Family History  Problem Relation Age of Onset  . Heart disease Mother   . Diabetes Father   . Hypertension Father   . Prostate cancer Maternal Grandfather        possible prostate cancer  . Colon cancer Neg Hx    Current Outpatient Medications  Medication Instructions  . apixaban (ELIQUIS) 5 mg, Oral, 2 times daily  . carvedilol (COREG) 25 mg, Oral, 2 times daily with meals  . diltiazem (CARDIZEM CD) 360 mg, Oral, Daily  . diltiazem (CARDIZEM CD) 300 mg, Oral, Daily  . irbesartan (AVAPRO) 150 mg, Oral, Daily  . torsemide (DEMADEX)  20 mg, Oral, Daily    Social History:  reports that he quit smoking about 4 weeks ago. His smoking use included cigarettes. He smoked 25.00 packs per day. He has never used smokeless tobacco. He reports current alcohol use. He reports previous drug use.   Exam: Current vital signs: BP 106/64   Pulse 63   Temp 99.32 F (37.4 C)   Resp (!) 23   Wt 106.2 kg   SpO2 97%   BMI 36.67 kg/m  Vital signs in last 24 hours: Temp:  [93.38 F (34.1 C)-99.32 F (37.4 C)] 99.32 F (37.4 C) (12/31 0920) Pulse Rate:  [43-102] 63  (12/31 1230) Resp:  [14-23] 23 (12/31 1230) BP: (84-128)/(53-79) 106/64 (12/31 1230) SpO2:  [96 %-100 %] 97 % (12/31 1230) Arterial Line BP: (93-201)/(45-83) 127/59 (12/31 1230) FiO2 (%):  [28 %-30 %] 28 % (12/31 1358)   Physical Exam  Constitutional: Appears well-developed and well-nourished.  Psych: Comatose  Eyes: No scleral injection HENT: ETT in place MSK: no joint deformities.  Cardiovascular: Normal rate and regular rhythm.  Respiratory: Comfortable on ventilator GI: Soft.  No distension.   Skin: WDI, multiple tattoos   Neuro: Mental Status: Intubated, sedated on propofol and fentanyl and recently paralyzed for EEG, therefore full neurological exam cannot be completed. Cranial Nerves: II: Left pupil is slightly irregular 2 to 1.5 mm, right pupil is 2 mm and fixed Further exam deferred given paralytic on board, notably no response to nursing suctioning patient, which they report he is typically responsive to  I have reviewed labs in epic and the results pertinent to this consultation are: Worsening renal function with creatinine increasing from 2.6 to 3.1 Leukocytosis improved from 13.3 to 8.5  Lab Results  Component Value Date   CHOL 147 02/10/2020   HDL 31 (L) 02/10/2020   LDLCALC 99 02/10/2020   TRIG 121 03/15/2020   CHOLHDL 4.7 02/10/2020   Lab Results  Component Value Date   HGBA1C 5.0 02/23/2020   Lab Results  Component Value Date   TSH 2.870 02/09/2020     Echocardiogram with reduced left ventricular ejection fraction (20 to 25%), severely reduced right ventricular function, normal left atrial size, normal right atrial size,  I have personally reviewed the images obtained: Head CT with likely chronic microvascular changes given his uncontrolled hypertension MRI brain with left pontine stroke as well described by radiology, punctate lesion in the right motor strip, and numerous microhemorrhages predominantly in the posterior fossa and brainstem again  consistent with uncontrolled hypertension; no cortical hemorrhages.  T2 hyperintensities consistent with microvascular disease though the differential can be broad for these changes.  12/29 EEG suggestive of profound diffuse encephalopathy without seizures or epileptiform discharges 12/30 EEG (still on propofol and fentanyl) notable for severe diffuse encephalopathy, improving compared to prior without seizure or epileptiform discharges  Impression: This is a 34 year old gentleman with past medical history significant for poorly controlled hypertension complicated by chronic kidney disease, acute decompensated heart failure with reduced ejection fraction, paroxysmal atrial fibrillation now in sinus rhythm, V. 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  Etiology is likely cardioembolic in the setting of his risk factors.  Exam is limited at this time given his recent sedation/paralytic administration.  Improving pattern of encephalopathy on EEG is encouraging.  Recommendations: # Pontine stroke - Stroke labs: Recent TSH and A1c are normal as above, LDL above goal at 99 - MRI brain completed as above - MRA of the brain without  contrast when stabilized - Frequent neuro checks  - Echocardiogram completed as above with new biventricular failure.  Will confirm with cardiology adequacy of the study to exclude intracardiac thrombus; consider TEE if cardiology feels TTE was not sufficiently sensitive - Carotid dopplers - Aspirin 325 mg daily until anticoagulation resumed - Atorvastatin 40 mg nightly  - If patient remains stable, will likely resume anticoagulation for atrial fibrillation on 03/17/2020 - Risk factor modification (medication compliance, smoking cessation, diet, exercise) - Telemetry monitoring; 30 day event monitor on discharge if no arrythmias captured  - Blood pressure goal   - Normotension - PT consult, OT consult, Speech consult, when patient stabilized - Neurology to  follow  [place] [phone]

## 2020-03-15 NOTE — Progress Notes (Signed)
Progress Note  Patient Name: Jeremiah Keith Date of Encounter: 03/15/2020  [facility] Cardiologist: [name], MD   Subjective   No significant events overnight Remains intubated Rewarmed yesterday, uneventful Sedation held 40 minutes, had hypotension systolic pressure 967, placed back on sedation Off pressors Low FiO2  Inpatient Medications    Scheduled Meds: . artificial tears  1 application Both Eyes E9F  . chlorhexidine gluconate (MEDLINE KIT)  15 mL Mouth Rinse BID  . Chlorhexidine Gluconate Cloth  6 each Topical Q0600  . Chlorhexidine Gluconate Cloth  6 each Topical Q0600  . feeding supplement (PROSource TF)  45 mL Per Tube Daily  . insulin aspart  0-9 Units Subcutaneous Q4H  . mouth rinse  15 mL Mouth Rinse 10 times per day  . midazolam  2 mg Intravenous Once  . multivitamin  15 mL Per Tube Daily   Continuous Infusions: . amiodarone 30 mg/hr (03/15/20 0910)  . dextrose 5 % and 0.9% NaCl 75 mL/hr at 03/15/20 0910  . epinephrine Stopped (03/14/20 0912)  . famotidine (PEPCID) IV 20 mg (03/14/20 2142)  . feeding supplement (VITAL HIGH PROTEIN) 60 mL/hr at 03/15/20 0845  . fentaNYL infusion INTRAVENOUS 150 mcg/hr (03/15/20 0910)  . heparin 1,150 Units/hr (03/15/20 0910)  . midazolam Stopped (02/22/2020 0950)  . propofol (DIPRIVAN) infusion 50 mcg/kg/min (03/15/20 0910)   PRN Meds: acetaminophen, atropine, dextrose, fentaNYL, fentaNYL, midazolam   Vital Signs    Vitals:   03/15/20 0909 03/15/20 0910 03/15/20 0915 03/15/20 0920  BP:      Pulse: 71 72 72 73  Resp: 14 (!) 21 (!) 21 20  Temp: 98.96 F (37.2 C) 99.14 F (37.3 C) 99.14 F (37.3 C) 99.32 F (37.4 C)  TempSrc:      SpO2: 100% 99% 97% 97%  Weight:        Intake/Output Summary (Last 24 hours) at 03/15/2020 0959 Last data filed at 03/15/2020 0910 Gross per 24 hour  Intake 4213.59 ml  Output 1025 ml  Net 3188.59 ml   Last 3 Weights 03/15/2020 03/11/2020 02/16/2020  Weight  (lbs) 234 lb 2.1 oz 222 lb 225 lb  Weight (kg) 106.2 kg 100.699 kg 102.059 kg      Telemetry    Normal sinus rhythm- Personally Reviewed  ECG     - Personally Reviewed  Physical Exam   GEN: No acute distress.  Intubated sedated Neck:  Unable to estimate JVD Cardiac: RRR, no murmurs, rubs, or gallops.  Respiratory: Clear to auscultation bilaterally. GI: Soft, non-distended  MS: No edema; No deformity. Neuro:   Unable to test Psych: Unable to test  Labs    High Sensitivity Troponin:   Recent Labs  Lab 03/01/2020 0700 03/11/2020 0903 02/20/2020 1237 02/19/2020 1708 02/18/2020 2043  TROPONINIHS 22* 47* 106* 126* 136*      Chemistry Recent Labs  Lab 03/11/20 0948 02/28/2020 0700 02/17/2020 1237 03/03/2020 1708 02/23/2020 2043 03/14/20 0334  NA 141 137   < > 141 142 141  K 4.4 3.5   < > 4.1 4.9 4.8  CL 107* 103   < > 109 109 110  CO2 19* 21*   < > 21* 20* 21*  GLUCOSE 115* 263*   < > 217* 117* 122*  BUN 23* 29*   < > 33* 32* 29*  CREATININE 2.41* 2.96*   < > 2.92* 2.93* 2.64*  CALCIUM 9.5 8.8*   < > 7.9* 8.7* 8.8*  PROT  --  6.8  --  5.7*  --   --   ALBUMIN  --  3.9  --  3.3*  --   --   AST  --  28  --  22  --   --   ALT  --  19  --  23  --   --   ALKPHOS  --  72  --  65  --   --   BILITOT  --  1.0  --  0.6  --   --   GFRNONAA 34* 27*   < > 28* 28* 31*  GFRAA 39*  --   --   --   --   --   ANIONGAP  --  13   < > 11 13 10    < > = values in this interval not displayed.     Hematology Recent Labs  Lab 03/12/2020 0700 03/14/20 0334 03/15/20 0513  WBC 17.2* 13.3* 8.5  RBC 5.15 4.58 3.93*  HGB 15.9 14.0 12.3*  HCT 47.2 42.6 37.5*  MCV 91.7 93.0 95.4  MCH 30.9 30.6 31.3  MCHC 33.7 32.9 32.8  RDW 12.7 12.9 13.2  PLT 254 263 167    BNP Recent Labs  Lab 03/10/2020 0700  BNP 229.5*     DDimer No results for input(s): DDIMER in the last 168 hours.   Radiology    DG Chest Port 1 View  Result Date: 03/14/2020 CLINICAL DATA:  Acute respiratory failure EXAM:  PORTABLE CHEST 1 VIEW COMPARISON:  03/06/2020 FINDINGS: Endotracheal tube seen 5.6 cm above the carina. Nasogastric tube extends into the left upper quadrant of the abdomen overlying the proximal body of the stomach. The lungs are symmetrically well expanded and are clear. No pneumothorax or pleural effusion. Cardiac size within normal limits. Pulmonary vascularity is normal. No acute bone abnormality. IMPRESSION: Stable examination. Stable support tubes. No focal pulmonary infiltrate. Electronically Signed   By: Fidela Salisbury MD   On: 03/14/2020 06:00   DG Chest Port 1 View  Result Date: 02/24/2020 CLINICAL DATA:  Acute respiratory failure with hypoxia. EXAM: PORTABLE CHEST 1 VIEW COMPARISON:  Earlier film, same date. FINDINGS: The endotracheal tube is now 2.4 cm above the carina. The NG tube is coursing down the esophagus and into the stomach. External pacer paddles are noted. The heart is within normal limits in size given the AP projection, portable technique and supine position of the patient. The lungs are clear of an acute process. No pleural effusions or pneumothorax. IMPRESSION: 1. The endotracheal tube is now 2.4 cm above the carina. 2. No acute cardiopulmonary findings. Electronically Signed   By: Marijo Sanes M.D.   On: 02/18/2020 15:11   DG Chest Port 1 View  Result Date: 02/15/2020 CLINICAL DATA:  Encounter for intubation. EXAM: PORTABLE CHEST 1 VIEW COMPARISON:  Radiographs 03/06/2020 and 02/09/2020.  CT 04/12/2014. FINDINGS: 1051 hours. Tip of the endotracheal tube is just below the thoracic inlet, approximately 6.5 cm above the carina. Enteric tube is in place, not well visualized, but projecting below the diaphragm. The heart size and mediastinal contours are stable. The lungs are clear. There is no pleural effusion or pneumothorax. No acute osseous findings are evident. IMPRESSION: Endotracheal tube tip just below the thoracic inlet. Enteric tube extends below the diaphragm. No acute  cardiopulmonary process. Electronically Signed   By: Richardean Sale M.D.   On: 03/11/2020 11:19   EEG adult  Result Date: 02/21/2020 Lora Havens, MD     03/08/2020  5:17  PM Patient Name: Jeremiah Keith MRN: [id number] Epilepsy Attending: Lora Havens Referring Physician/Provider: Dr [name], NP Date: 03/05/2020 Duration: 25.05 mins Patient history: 34yo M s/p cardiac arrest. EEG to evaluate for seizure Level of alertness: comatose AEDs during EEG study: Propofol Technical aspects: This EEG study was done with scalp electrodes positioned according to the 10-20 International system of electrode placement. Electrical activity was acquired at a sampling rate of 500Hz  and reviewed with a high frequency filter of 70Hz  and a low frequency filter of 1Hz . EEG data were recorded continuously and digitally stored. Description: EEG showed burst suppression with bursts of generalized low amplitude 3-5hz  theta-delta slowing lasting 3-5seconds and generalized suppression lasting 4-6 seconds. EEG was not reactive to tactile stimulation. Hyperventilation and photic stimulation were not performed.   ABNORMALITY -Burst suppression, generalized IMPRESSION: This study is suggestive of profoud diffuse encephalopathy, nonspecific etiology but likely related to sedation, anoxic/hypoxic brain injury. No seizures or epileptiform discharges were seen throughout the recording. Lora Havens   ECHOCARDIOGRAM COMPLETE  Result Date: 02/26/2020    ECHOCARDIOGRAM REPORT   Patient Name:   Jeremiah Keith Date of Exam: 03/08/2020 Medical Rec #:  [id number]             Height:       67.0 in Accession #:    [id number]            Weight:       222.0 lb Date of Birth:  12-13-1985            BSA:          2.114 m Patient Age:    89 years              BP:           93/80 mmHg Patient Gender: M                     HR:           61 bpm. Exam Location:  [facility] Procedure: 2D Echo, Color Doppler, Cardiac Doppler and  Intracardiac            Opacification Agent STAT ECHO Indications:     I46.9 Cardiac arrest  History:         Patient has prior history of Echocardiogram examinations, most                  recent 02/09/2020. CKD, Arrythmias:Atrial Fibrillation; Risk                  Factors:Hypertension.  Sonographer:     Charmayne Sheer RDCS (AE) Referring Phys:  [id number] [name] Diagnosing Phys: [name] MD  Sonographer Comments: Suboptimal apical window and echo performed with patient supine and on artificial respirator. Image acquisition challenging due to patient body habitus. IMPRESSIONS  1. Left ventricular ejection fraction, by estimation, is 20 to 25%. The left ventricle has severely decreased function. The left ventricle demonstrates global hypokinesis. There is mild left ventricular hypertrophy. Left ventricular diastolic parameters  are consistent with Grade I diastolic dysfunction (impaired relaxation).  2. Right ventricular systolic function is severely reduced. The right ventricular size is normal.  3. The mitral valve is normal in structure. No evidence of mitral valve regurgitation.  4. The aortic valve is normal in structure. Aortic valve regurgitation is trivial.  5. Aortic dilatation noted. There is mild dilatation of the aortic root, measuring 42 mm. FINDINGS  Left Ventricle: Left  ventricular ejection fraction, by estimation, is 20 to 25%. The left ventricle has severely decreased function. The left ventricle demonstrates global hypokinesis. Definity contrast agent was given IV to delineate the left ventricular endocardial borders. The left ventricular internal cavity size was normal in size. There is mild left ventricular hypertrophy. Left ventricular diastolic parameters are consistent with Grade I diastolic dysfunction (impaired relaxation). Right Ventricle: The right ventricular size is normal. No increase in right ventricular wall thickness. Right ventricular systolic function is severely  reduced. Left Atrium: Left atrial size was normal in size. Right Atrium: Right atrial size was normal in size. Pericardium: There is no evidence of pericardial effusion. Mitral Valve: The mitral valve is normal in structure. No evidence of mitral valve regurgitation. MV peak gradient, 1.6 mmHg. The mean mitral valve gradient is 1.0 mmHg. Tricuspid Valve: The tricuspid valve is normal in structure. Tricuspid valve regurgitation is not demonstrated. Aortic Valve: The aortic valve is normal in structure. Aortic valve regurgitation is trivial. Aortic valve mean gradient measures 1.0 mmHg. Aortic valve peak gradient measures 2.5 mmHg. Aortic valve area, by VTI measures 2.44 cm. Pulmonic Valve: The pulmonic valve was normal in structure. Pulmonic valve regurgitation is not visualized. Aorta: Aortic dilatation noted. There is mild dilatation of the aortic root, measuring 42 mm. Venous: IVC assessment for right atrial pressure unable to be performed due to mechanical ventilation. IAS/Shunts: No atrial level shunt detected by color flow Doppler.  LEFT VENTRICLE PLAX 2D LVIDd:         6.26 cm      Diastology LVIDs:         5.11 cm      LV e' medial:    5.66 cm/s LV PW:         1.28 cm      LV E/e' medial:  8.6 LV IVS:        0.93 cm      LV e' lateral:   4.68 cm/s LVOT diam:     2.60 cm      LV E/e' lateral: 10.4 LV SV:         34 LV SV Index:   16 LVOT Area:     5.31 cm  LV Volumes (MOD) LV vol d, MOD A2C: 91.7 ml LV vol d, MOD A4C: 128.0 ml LV vol s, MOD A2C: 62.0 ml LV vol s, MOD A4C: 76.8 ml LV SV MOD A2C:     29.7 ml LV SV MOD A4C:     128.0 ml LV SV MOD BP:      39.1 ml RIGHT VENTRICLE RV Basal diam:  3.80 cm LEFT ATRIUM             Index       RIGHT ATRIUM           Index LA diam:        3.20 cm 1.51 cm/m  RA Area:     16.70 cm LA Vol (A2C):   62.5 ml 29.57 ml/m RA Volume:   45.20 ml  21.38 ml/m LA Vol (A4C):   34.6 ml 16.37 ml/m LA Biplane Vol: 47.0 ml 22.23 ml/m  AORTIC VALVE                   PULMONIC VALVE  AV Area (Vmax):    3.30 cm    PV Vmax:       0.42 m/s AV Area (Vmean):   3.25 cm    PV Vmean:  32.300 cm/s AV Area (VTI):     2.44 cm    PV VTI:        0.074 m AV Vmax:           79.70 cm/s  PV Peak grad:  0.7 mmHg AV Vmean:          52.100 cm/s PV Mean grad:  0.0 mmHg AV VTI:            0.139 m AV Peak Grad:      2.5 mmHg AV Mean Grad:      1.0 mmHg LVOT Vmax:         49.60 cm/s LVOT Vmean:        31.900 cm/s LVOT VTI:          0.064 m LVOT/AV VTI ratio: 0.46  AORTA Ao Root diam: 4.20 cm MITRAL VALVE               TRICUSPID VALVE MV Area (PHT): 3.26 cm    TR Peak grad:   16.6 mmHg MV Peak grad:  1.6 mmHg    TR Vmax:        204.00 cm/s MV Mean grad:  1.0 mmHg MV Vmax:       0.64 m/s    SHUNTS MV Vmean:      37.1 cm/s   Systemic VTI:  0.06 m MV Decel Time: 233 msec    Systemic Diam: 2.60 cm MV E velocity: 48.80 cm/s MV A velocity: 29.60 cm/s MV E/A ratio:  1.65 [name] MD Electronically signed by [name] MD Signature Date/Time: 03/06/2020/12:58:31 PM    Final     Cardiac Studies   Echo 1. Left ventricular ejection fraction, by estimation, is 20 to 25%. The  left ventricle has severely decreased function. The left ventricle  demonstrates global hypokinesis. There is mild left ventricular  hypertrophy. Left ventricular diastolic parameters  are consistent with Grade I diastolic dysfunction (impaired relaxation).  2. Right ventricular systolic function is severely reduced. The right  ventricular size is normal.  3. The mitral valve is normal in structure. No evidence of mitral valve  regurgitation.  4. The aortic valve is normal in structure. Aortic valve regurgitation is  trivial.  5. Aortic dilatation noted. There is mild dilatation of the aortic root,  measuring 42 mm.   Patient Profile     34 y.o. male with history of persistent Afib, CKD stage IIIb, HTN, and tobacco use who presented with VF arrest.   Assessment & Plan    VF arrest  Unresponsive,  presenting to fire department , requiring defibrillation x2  Intubated for airway protection  Given nonsustained VT on arrival, was started on amiodarone infusion  Has completed rewarming CT scan brain with mild hypodensity frontal white matter MRI pending   Cardiomyopathy  Global hypokinesis, etiology unclear  Right and left heart catheterization next week  depending on encephalopathy -Given fluctuations in blood pressure as sedation is weaned, will hold off on starting beta-blocker Given renal dysfunction not a good candidate for ARB, ACE inhibitor,entersto -May benefit from hydralazine, nitrates in the near future   Persistent atrial fibrillation  Maintaining normal sinus rhythm after cardioversion x2 by fire department  We will continue heparin    Acute on chronic renal failure stage IIIb Renal dysfunction, suspect ATN  Renal function pending Unable to exclude cardiorenal   Total encounter time more than 25 minutes  Greater than 50% was spent in counseling and coordination of care with  the patient     For questions or updates, please contact [place] Please consult [email or web address] for contact info under        Signed, Ida Rogue, MD  03/15/2020, 9:59 AM

## 2020-03-15 NOTE — Progress Notes (Signed)
NAME:  Jeremiah Keith, MRN:  [id number], DOB:  July 05, 1985, LOS: 2 ADMISSION DATE:  03/15/2020, CONSULTATION DATE:  02/21/2020 REFERRING MD:  Dr. Corky Downs, CHIEF COMPLAINT:  Cardiac Arrest   Brief History:  34 y.o. Male with a PMH significant for Paroxsymal Atrial fibrillation, Hypertension, and CKD admitted s/p out of hospital cardiac arrest (V-fib).  Being placed on Targeted Temperature Management @ 36 C. 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  History of Present Illness:  Jeremiah Keith is a 34 year old male with a past medical history significant for paroxysmal atrial fibrillation on Eliquis for 4 weeks, chronic kidney disease, and hypertension who presented to [facility] ED on 03/02/2020 following an out of hospital V. fib cardiac arrest.  Apparently he was scheduled to have a cardioversion done today and in route to the appointment his father noted he became unresponsive in the car where he subsequently pulled into an EMS station.  CPR was started, and he received defibrillation x3 for reported V. fib arrest with ROSC obtained.  Estimated time of collapse to CPR is 4 minutes, with ROSC obtained after 6 minutes 43 seconds.  ED course: Upon arrival to the ED he was emergently intubated.  He remained unresponsive therefore he was placed on targeted temperature management at 36 C.  Cardiology was consulted and recommended placing on amiodarone drip.  Initial work-up shows bicarb 21, BUN 29, creatinine 2.96, glucose 263, BNP 229.5, high-sensitivity troponin 22, lactic acid 5.7, WBC 17.2 with neutrophilia, and procalcitonin is negative.  Chest x-ray is negative for any acute cardiopulmonary process.  His SARS-CoV-2 PCR and influenza PCR both negative.  Urinalysis is not consistent with UTI.  Urine drug screen is positive for cannabinoid.  CT head obtained which showed mild hypodensity in the frontal white matter (right greater than left)  which is nonspecific in appearance, along with possible chronic ischemia.  PCCM is asked to admit the patient to ICU for further work-up and treatment of V. fib cardiac arrest.  Post admission to ICU he is now hypotensive requiring vasopressors.  Echocardiogram performed revealing Cardiomyopathy and biventricular failure with EF of 20 to 25%.  Central venous access and arterial line has been placed.   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 Intubated. Endotracheal tube tip in good position between the level the clavicles and carina. Enteric tube courses to the abdomen, tip not included. Partially visible gas in the stomach. Resuscitation pads project over the lower chest. Somewhat low lung volumes. Mediastinal contours remain within normal limits. Allowing for portable technique the lungs are clear. No osseous abnormality identified. 12/29: Abdominal X-ray>>Enteric tube terminates in the stomach, side hole at the gastric fundus. Moderate gastric distension. 12/29: CT Head w/o Contrast>>Mild hypodensity in the frontal white matter right greater than left, not present in 2017. Nonspecific appearance. Possible chronic ischemia however given the patient's age consider MRI of the brain without and with contrast for  further  evaluation 12/29: 2D Echocardiogram>>1. Left ventricular ejection fraction, by estimation, is 20 to 25%. The  left ventricle has severely decreased function. The left ventricle  demonstrates global hypokinesis. There is mild left ventricular  hypertrophy. Left ventricular diastolic parameters  are consistent with Grade I diastolic dysfunction (impaired relaxation).  2. Right ventricular systolic function is severely reduced. The right  ventricular size is normal.  3. The mitral valve is normal in structure. No evidence of mitral valve  regurgitation.  4. The aortic valve is normal in structure. Aortic valve regurgitation is  trivial.  5. Aortic dilatation noted. There is mild dilatation of the aortic root,  measuring 42 mm.  12/29: EEG>>This study is suggestive of profoud diffuse encephalopathy, nonspecific etiology but likely related to sedation, anoxic/hypoxic brain injury. No seizures or epileptiform discharges were seen throughout the recording. 12/31: MRI Brain>>1. 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 Trace cytotoxic edema at the lacunar infarct site, but no pontine vasogenic edema or mass effect to suggest any of these micro-bleeds are acute. 2. There is also a punctate cortical infarct at the superior right motor strip, without hemorrhage or mass effect. 3. Numerous chronic micro-hemorrhages throughout the lower brainstem. Lesser involvement of the bilateral cerebellum, right thalamus. Chronic ischemia in the right frontal lobe white matter corresponding to the recent CT finding. 12/31: EEG>>This study issuggestive ofsevere diffuse encephalopathy, nonspecific etiology but likely related to sedation, anoxic/hypoxic brain injury.No seizures or epileptiform discharges were seen throughout the recording. EEG appears to be improving compared to previous study on 02/16/2020  Micro Data:  12/29: SARS-CoV-2  PCR>>negative 12/29: Influenza PCR>>negative 12/29: Blood culture x2>> 12/29: Urine>> 12/29: Tracheal aspirate>>  Antimicrobials:  N/A  Interim History / Subjective:  No acute events noted overnight TTM completed Sedated with Fentanyl and Propofol, plan for WUA today Off pressors Plan for MRI Brain and repeat EEG today  Objective   Blood pressure (!) 93/55, pulse (!) 57, temperature 98.6 F (37 C), resp. rate 20, weight 106.2 kg, SpO2 99 %.    Vent Mode: PRVC FiO2 (%):  [28 %-30 %] 28 % Set Rate:  [20 bmp] 20 bmp Vt Set:  [500 mL] 500 mL PEEP:  [5 cmH20-8 cmH20] 5 cmH20 Plateau Pressure:  [20 cmH20] 20 cmH20   Intake/Output Summary (Last 24 hours) at 03/15/2020 0802 Last data filed at 03/15/2020 0600 Gross per 24 hour  Intake 3844.57 ml  Output 1150 ml  Net 2694.57 ml   Filed Weights   03/02/2020 1027  Weight: 106.2 kg    Examination: General: Critically ill appearing male, laying in bed, intubated and sedated, in NAD HENT: Atraumatic, normocephalic, neck supple, no JVD, ETT in place Lungs: Clear breath sounds bilaterally, no wheezing or rales, synchronous with the vent, even Cardiovascular: Bradycardia, regular rhythm, s1s2, no M/R/G, 2+ distal pulses Abdomen: Soft, nontender, nondistended, no guarding or rebound tenderness, BS+ x4 Extremities: Normal bulk and tone, no deformities, no edema Neuro: Heavily sedated, pupils PERRL Skin: Warm and dry.  No obvious rashes, lesions, or ulcerations  Resolved Hospital Problem list   N/A  Assessment & Plan:   V. Fib Cardiac arrest Cardiogenic Shock Cardiomyopathy, Biventricular failure (EF 25% on Echo 02/26/2020) Paroxsymal Atrial Fibrillation Hx: HTN -Continuous cardiac monitoring -Maintain MAP >65 -Vasopressors as needed to maintain MAP goal>>currently weaned off -Trend lactic acid -Trend Troponin -Targeted Temperature Management @ 36 C completed -Cardiology following, appreciate input -Heparin gtt d/c due to new  CVA and risk  of hemorrhagic converions -Unable to diurese at this time due to  AKI -Echo on 02/17/2020 with: 1. Left ventricular ejection fraction, by estimation, is 20 to 25%. The  left ventricle has severely decreased function. The left ventricle  demonstrates global hypokinesis. There is mild left ventricular  hypertrophy. Left ventricular diastolic parameters  are consistent with Grade I diastolic dysfunction (impaired relaxation).  2. Right ventricular systolic function is severely reduced. The right  ventricular size is normal.  3. The mitral valve is normal in structure. No evidence of mitral valve  regurgitation.  4. The aortic valve is normal in structure. Aortic valve regurgitation is  trivial.  5. Aortic dilatation noted. There is mild dilatation of the aortic root,  measuring 42 mm.  -Urine drug screen positive for cannabinoid   Acute Hypoxic Respiratory Failure in the setting of Cardiac Arrest Suspected Aspiration Pneumonitis -Full vent support, vent settings established and reviewed -Wean FiO2 & PEEP as tolerated to maintain O2 sats >92% -Follow intermittent CXR and ABG as needed -VAP Bundle implemented -Spontaneous breathing trials when respiratory parameters met and mental status permits   Leukocytosis Suspected Aspiration Pneumonitis -Monitor fever curve -Trend WBC's & Procalcitonin>> WBC decreased to 8.5 -Procalcitonin is flat, will hold off on empiric ABX for now -Follow pan cultures as above -CXR without evidence of pneumonia -UA not concerning for UTI -Abdominal exam is benign   AKI superimposed on CKD Mild Hyperkalemia>>resolved -Monitor I&O's / urinary output -Follow BMP -Ensure adequate renal perfusion -Avoid nephrotoxic agents as able -Replace electrolytes as indicated -Nephrology consulted, appreciate input    Unresponsive s/p cardiac arrest Concern for possible anoxic brain injury MRI Brain w/ Acute Infarct of the left central Pons  with superimposed numerous pontine micro-hemorrhages   -Targeted Temperature Management @ 36 C completed -Maintain RASS 0 to -1 -Propofol, Fentanyl as needed -Daily wake up assessments  -Neurology consulted, appreciate input   Hyperglycemia -CBG's -Sliding scale insulin -Follow ICU Hypo/hyperglycemia protocol  -Check Hgb A1c   Best practice (evaluated daily)  Diet: NPO, Tube feeds, dietician following Pain/Anxiety/Delirium protocol (if indicated): Fentanyl and Propfol VAP protocol (if indicated): Yes, HOB 30 degrees DVT prophylaxis: SCD's (Heparint d/c due to new CVA) GI prophylaxis: Pepcid IV Glucose control: SSI Mobility: Bedrest Disposition:ICU  Goals of Care:  Last date of multidisciplinary goals of care discussion: 03/15/20 Family and staff present: Pt's spouse udpated at bedside 03/15/20 Summary of discussion: Plan for wake up assessment today to assess Neuro status, MRI with stroke Follow up goals of care discussion due: 03/16/20 Code Status: Full Code  Labs   CBC: Recent Labs  Lab 03/11/20 0948 02/18/2020 0700 03/14/20 0334 03/15/20 0513  WBC 9.4 17.2* 13.3* 8.5  NEUTROABS  --  10.2*  --   --   HGB 17.1 15.9 14.0 12.3*  HCT 48.6 47.2 42.6 37.5*  MCV 88 91.7 93.0 95.4  PLT 221 254 263 262    Basic Metabolic Panel: Recent Labs  Lab 02/25/2020 0700 03/09/2020 1237 02/21/2020 1708 03/15/2020 2043 03/14/20 0334 03/15/20 0513  NA 137 141 141 142 141  --   K 3.5 5.5* 4.1 4.9 4.8  --   CL 103 110 109 109 110  --   CO2 21* 23 21* 20* 21*  --   GLUCOSE 263* 145* 217* 117* 122*  --   BUN 29* 32* 33* 32* 29*  --   CREATININE 2.96* 2.94* 2.92* 2.93* 2.64*  --   CALCIUM 8.8* 8.2* 7.9* 8.7* 8.8*  --  MG  --   --   --   --  2.3 1.9  PHOS  --   --   --   --  4.9* 3.5   GFR: Estimated Creatinine Clearance: 44.9 mL/min (A) (by C-G formula based on SCr of 2.64 mg/dL (H)). Recent Labs  Lab 03/11/20 0948 02/22/2020 0700 03/15/2020 0903 02/21/2020 1237 03/14/20 0334  03/15/20 0513  PROCALCITON  --   --   --  <0.10 0.38 0.38  WBC 9.4 17.2*  --   --  13.3* 8.5  LATICACIDVEN  --  5.7* 1.5  --   --   --     Liver Function Tests: Recent Labs  Lab 03/12/2020 0700 02/18/2020 1708  AST 28 22  ALT 19 23  ALKPHOS 72 65  BILITOT 1.0 0.6  PROT 6.8 5.7*  ALBUMIN 3.9 3.3*   No results for input(s): LIPASE, AMYLASE in the last 168 hours. No results for input(s): AMMONIA in the last 168 hours.  ABG    Component Value Date/Time   PHART 7.27 (L) 03/14/2020 0500   PCO2ART 50 (H) 03/14/2020 0500   PO2ART 145 (H) 03/14/2020 0500   HCO3 23.0 03/14/2020 0500   ACIDBASEDEF 4.4 (H) 03/14/2020 0500   O2SAT 99.0 03/14/2020 0500     Coagulation Profile: Recent Labs  Lab 03/15/2020 0700 02/18/2020 1115  INR 1.2 1.2    Cardiac Enzymes: No results for input(s): CKTOTAL, CKMB, CKMBINDEX, TROPONINI in the last 168 hours.  HbA1C: Hgb A1c MFr Bld  Date/Time Value Ref Range Status  02/22/2020 12:37 PM 5.0 4.8 - 5.6 % Final    Comment:    (NOTE) Pre diabetes:          5.7%-6.4%  Diabetes:              >6.4%  Glycemic control for   <7.0% adults with diabetes     CBG: Recent Labs  Lab 03/14/20 2315 03/15/20 0050 03/15/20 0155 03/15/20 0319 03/15/20 0731  GLUCAP 88 64* 93 65* 96    Review of Systems:   Unable to assess due to Critical illness, intubation, and sedation  Past Medical History:  He,  has a past medical history of Aortic root dilatation ([place]), CKD (chronic kidney disease), Depression, History of chicken pox, History of echocardiogram, History of stress test, Hypertension, PAF (paroxysmal atrial fibrillation) ([place]), and Substance abuse ([place]).   Surgical History:   Past Surgical History:  Procedure Laterality Date  . CARDIOVERSION N/A 03/05/2020   Procedure: CARDIOVERSION;  Surgeon: [name], MD;  Location: [facility] ORS;  Service: Cardiovascular;  Laterality: N/A;  . TONSILLECTOMY AND ADENOIDECTOMY  1990     Social History:    reports that he quit smoking about 4 weeks ago. His smoking use included cigarettes. He smoked 25.00 packs per day. He has never used smokeless tobacco. He reports current alcohol use. He reports previous drug use.   Family History:  His family history includes Diabetes in his father; Heart disease in his mother; Hypertension in his father; Prostate cancer in his maternal grandfather. There is no history of Colon cancer.   Allergies Allergies  Allergen Reactions  . Nsaids     Chronic kidney disease  . Penicillins Rash    Has patient had a PCN reaction causing immediate rash, facial/tongue/throat swelling, SOB or lightheadedness with hypotension: Yes Has patient had a PCN reaction causing severe rash involving mucus membranes or skin necrosis: No Has patient had a PCN reaction that required hospitalization: No  Has patient had a PCN reaction occurring within the last 10 years: No If all of the above answers are "NO", then may proceed with Cephalosporin use.      Home Medications  Prior to Admission medications   Medication Sig Start Date End Date Taking? Authorizing Provider  apixaban (ELIQUIS) 5 MG TABS tablet Take 1 tablet (5 mg total) by mouth 2 (two) times daily. 02/10/20   Nita Sells, MD  carvedilol (COREG) 25 MG tablet Take 1 tablet (25 mg total) by mouth 2 (two) times daily with a meal. 02/10/20   Nita Sells, MD  diltiazem (CARDIZEM CD) 360 MG 24 hr capsule Take 1 capsule (360 mg total) by mouth daily. 03/11/20   [name], MD  irbesartan (AVAPRO) 150 MG tablet Take 1 tablet (150 mg total) by mouth daily. 02/10/20   Nita Sells, MD  torsemide (DEMADEX) 20 MG tablet Take 1 tablet (20 mg total) by mouth daily. 03/11/20 06/09/20  [name], MD     Critical care time: 36 minutes   [name], AGACNP-BC [facility] Pager: [phone]

## 2020-03-15 NOTE — Progress Notes (Signed)
ANTICOAGULATION CONSULT NOTE  Pharmacy Consult for heparin Indication: chest pain/ACS  Patient Measurements: Heparin Dosing Weight: 88 kg  Labs: Recent Labs    02/14/2020 0700 02/29/2020 0903 03/11/2020 1115 03/05/2020 1237 02/28/2020 1708 03/12/2020 2043 03/14/20 0334 03/14/20 0948 03/15/20 0513  HGB 15.9  --   --   --   --   --  14.0  --  12.3*  HCT 47.2  --   --   --   --   --  42.6  --  37.5*  PLT 254  --   --   --   --   --  263  --  167  APTT 30  --  33  --   --  85* 99* 71* 98*  LABPROT 14.7  --  15.0  --   --   --   --   --   --   INR 1.2  --  1.2  --   --   --   --   --   --   HEPARINUNFRC  --   --  1.79*  --   --   --  1.40*  --  0.68  CREATININE 2.96*  --   --  2.94* 2.92* 2.93* 2.64*  --   --   TROPONINIHS 22*   < >  --  106* 126* 136*  --   --   --    < > = values in this interval not displayed.    Estimated Creatinine Clearance: 44.9 mL/min (A) (by C-G formula based on SCr of 2.64 mg/dL (H)).   Medical History: Past Medical History:  Diagnosis Date  . Aortic root dilatation ([place])    a. 01/2020 Echo: Ao root 4.3cm.  . CKD (chronic kidney disease)   . Depression    after death of family members  . History of chicken pox   . History of echocardiogram    a. 01/2020 Echo: EF 55-60%. No rwma. Mild LVH. Triv AI. Ao root 4.3 cm.  . History of stress test    a. 10/2017 MV: EF 40-45%, diff HK. No scar/ischemia-->intermediate risk in setting of depressed EF however, EF 55-60% by echo-->med managed.  . Hypertension   . PAF (paroxysmal atrial fibrillation) ([facility])    a. CHA2DS2VASc = 1-->eliquis.  . Substance abuse [facility])    h/o cocaine abuse     Assessment: 34 year old male s/p cardiac arrest with ROSC requiring multiple defibrillations and epinephrine. Patient was on his way to an appointment for cardioversion for persistent afib. Patient does take Eliquis PTA. Due to nature of presentation, post-arrest and now on Code Cool, difficult to assess last dose of Eliquis PTA.  Likely this morning or last night.   Heparin course: 12/29 at 1508 Heparin 2000 unit IV bolus x 1 and drip started at 1150 units/hr 12/29 at 2043 aPTT = 85, therapeutic x 1. Heparin drip continued at 1150 units/hr 12/30 at 0334 aPTT = 99, therapeutic x 2. Heparin drip continued at 1150 units/hr 12/30 at 0948 aPTT = 71, therapeutic x 3. Heparin drip continued at 1150 units/hr 12/31 @ 0513 aPTT = 98, therapeutic x 4, HL 0.68 therapeutic.  Heparin drip continued at 1150 unit/hr.  Goal of Therapy:  Heparin level 0.3-0.7 units/ml aPTT 66-102 seconds Monitor platelets by anticoagulation protocol: Yes   Plan:  --aPTT is therapeutic x 4. Will continue heparin infusion at current rate of 1150 units/hr -- With aPTT and HL both with therapeutic range, re-check  HL tomorrow AM.  --CBC daily per protocol  Renda Rolls, PharmD, [facility] 03/15/2020 6:29 AM

## 2020-03-15 NOTE — Procedures (Signed)
Patient Name: Jeremiah Keith  MRN: [id number]  Epilepsy Attending: Lora Havens  Referring Physician/Provider: Marda Stalker, NP Date: 03/15/2020 Duration: 25.05 mins  Patient history: 34yo M s/p cardiac arrest. EEG to evaluate for seizure  Level of alertness: comatose  AEDs during EEG study: Propofol  Technical aspects: This EEG study was done with scalp electrodes positioned according to the 10-20 International system of electrode placement. Electrical activity was acquired at a sampling rate of 500Hz  and reviewed with a high frequency filter of 70Hz  and a low frequency filter of 1Hz . EEG data were recorded continuously and digitally stored.   Description: EEG showed continuous generalized 2-3Hz  delta slowing as well as intermittent generalized 15-18Hz  beta activity. EEG was not reactive to tactile stimulation. Hyperventilation and photic stimulation were not performed.     ABNORMALITY -Continuous slow, generalized  IMPRESSION: This study is suggestive of severe diffuse encephalopathy, nonspecific etiology but likely related to sedation, anoxic/hypoxic brain injury. No seizures or epileptiform discharges were seen throughout the recording.  EEG appears to be improving compared to previous study on 03/05/2020  Jeremiah Keith Barbra Sarks

## 2020-03-15 NOTE — Plan of Care (Signed)
Pt had MRI today which demonstrated small infarcts with microhemorrhages. Heparin gtt stopped, EEG done. Tube feeding at goal, pt tolerating at this time. Arctic sun remains on for normothermia. Tylenol given this evening for underlying fever. Pt is having copious in line and oral secretions. Temp foley switched to regular foley for MRI. Plan to wean sedation as tolerated. Wife visited today, updated at bedside throughout shift.   Problem: Education: Goal: Knowledge of General Education information will improve Description: Including pain rating scale, medication(s)/side effects and non-pharmacologic comfort measures 03/15/2020 1839 by Juel Burrow, RN Outcome: Not Progressing 03/15/2020 1837 by Juel Burrow, RN Outcome: Not Progressing   Problem: Health Behavior/Discharge Planning: Goal: Ability to manage health-related needs will improve 03/15/2020 1839 by Juel Burrow, RN Outcome: Not Progressing 03/15/2020 1837 by Juel Burrow, RN Outcome: Not Progressing   Problem: Clinical Measurements: Goal: Ability to maintain clinical measurements within normal limits will improve 03/15/2020 1839 by Juel Burrow, RN Outcome: Not Progressing 03/15/2020 1837 by Juel Burrow, RN Outcome: Not Progressing Goal: Diagnostic test results will improve 03/15/2020 1839 by Juel Burrow, RN Outcome: Not Progressing 03/15/2020 1837 by Juel Burrow, RN Outcome: Not Progressing Goal: Respiratory complications will improve 03/15/2020 1839 by Juel Burrow, RN Outcome: Not Progressing 03/15/2020 1837 by Juel Burrow, RN Outcome: Not Progressing   Problem: Activity: Goal: Risk for activity intolerance will decrease 03/15/2020 1839 by Juel Burrow, RN Outcome: Not Progressing 03/15/2020 1837 by Juel Burrow, RN Outcome: Not Progressing   Problem: Pain Managment: Goal: General experience of comfort  will improve 03/15/2020 1839 by Juel Burrow, RN Outcome: Not Progressing 03/15/2020 1837 by Juel Burrow, RN Outcome: Not Progressing   Problem: Clinical Measurements: Goal: Will remain free from infection 03/15/2020 1839 by Juel Burrow, RN Outcome: Progressing 03/15/2020 1837 by Juel Burrow, RN Outcome: Not Progressing Goal: Cardiovascular complication will be avoided 03/15/2020 1839 by Juel Burrow, RN Outcome: Progressing 03/15/2020 1837 by Juel Burrow, RN Outcome: Progressing   Problem: Nutrition: Goal: Adequate nutrition will be maintained 03/15/2020 1839 by Juel Burrow, RN Outcome: Progressing 03/15/2020 1837 by Juel Burrow, RN Outcome: Progressing   Problem: Safety: Goal: Ability to remain free from injury will improve 03/15/2020 1839 by Juel Burrow, RN Outcome: Progressing 03/15/2020 1837 by Juel Burrow, RN Outcome: Progressing   Problem: Skin Integrity: Goal: Risk for impaired skin integrity will decrease Outcome: Progressing

## 2020-03-15 NOTE — Progress Notes (Signed)
[facility]  ROUNDING NOTE   Subjective:  Patient seen and evaluated at bedside. Has underwent rewarming process post hypothermia protocol. Still under sedation. Urine output was 1.4 L over the preceding 24 hours. Creatinine currently higher at 3.15.   Objective:  Vital signs in last 24 hours:  Temp:  [93.38 F (34.1 C)-99.32 F (37.4 C)] 99.32 F (37.4 C) (12/31 0920) Pulse Rate:  [43-102] 63 (12/31 1230) Resp:  [14-23] 23 (12/31 1230) BP: (84-128)/(53-79) 106/64 (12/31 1230) SpO2:  [96 %-100 %] 97 % (12/31 1230) Arterial Line BP: (93-201)/(45-83) 127/59 (12/31 1230) FiO2 (%):  [28 %-30 %] 28 % (12/31 1358)  Weight change:  Filed Weights   02/23/2020 1027  Weight: 106.2 kg    Intake/Output: I/O last 3 completed shifts: In: 6615.6 [I.V.:6027.6; NG/GT:438; IV Piggyback:150] Out: 9024 [Urine:3335; Emesis/NG output:50]   Intake/Output this shift:  Total I/O In: 892 [I.V.:759.5; NG/GT:82.5; IV Piggyback:50] Out: 225 [Urine:225]  Physical Exam: General:  Critically ill-appearing  Head:  Endotracheal tube in place  Eyes:  Closed  Neck:  Supple  Lungs:   Bilateral rhonchi, vent assisted  Heart:  S1S2 no rubs  Abdomen:   Soft, nontender, bowel sounds present  Extremities:  1+ peripheral edema.  Neurologic:  Intubated, sedated  Skin:  No lesions  Access:  No hemodialysis access    Basic Metabolic Panel: Recent Labs  Lab 03/03/2020 1237 02/21/2020 1708 02/29/2020 2043 03/14/20 0334 03/15/20 0513  NA 141 141 142 141 141  K 5.5* 4.1 4.9 4.8 4.2  CL 110 109 109 110 111  CO2 23 21* 20* 21* 20*  GLUCOSE 145* 217* 117* 122* 94  BUN 32* 33* 32* 29* 26*  CREATININE 2.94* 2.92* 2.93* 2.64* 3.15*  CALCIUM 8.2* 7.9* 8.7* 8.8* 8.8*  MG  --   --   --  2.3 1.9  PHOS  --   --   --  4.9* 3.5    Liver Function Tests: Recent Labs  Lab 03/12/2020 0700 02/18/2020 1708  AST 28 22  ALT 19 23  ALKPHOS 72 65  BILITOT 1.0 0.6  PROT 6.8 5.7*  ALBUMIN 3.9 3.3*   No  results for input(s): LIPASE, AMYLASE in the last 168 hours. No results for input(s): AMMONIA in the last 168 hours.  CBC: Recent Labs  Lab 03/11/20 0948 03/04/2020 0700 03/14/20 0334 03/15/20 0513  WBC 9.4 17.2* 13.3* 8.5  NEUTROABS  --  10.2*  --   --   HGB 17.1 15.9 14.0 12.3*  HCT 48.6 47.2 42.6 37.5*  MCV 88 91.7 93.0 95.4  PLT 221 254 263 167    Cardiac Enzymes: No results for input(s): CKTOTAL, CKMB, CKMBINDEX, TROPONINI in the last 168 hours.  BNP: Invalid input(s): POCBNP  CBG: Recent Labs  Lab 03/15/20 0050 03/15/20 0155 03/15/20 0319 03/15/20 0731 03/15/20 1349  GLUCAP 64* 93 65* 96 100*    Microbiology: Results for orders placed or performed during the hospital encounter of 03/15/2020  Resp Panel by RT-PCR (Flu A&B, Covid) Nasopharyngeal Swab     Status: None   Collection Time: 02/14/2020  7:00 AM   Specimen: Nasopharyngeal Swab; Nasopharyngeal(NP) swabs in vial transport medium  Result Value Ref Range Status   SARS Coronavirus 2 by RT PCR NEGATIVE NEGATIVE Final    Comment: (NOTE) SARS-CoV-2 target nucleic acids are NOT DETECTED.  The SARS-CoV-2 RNA is generally detectable in upper respiratory specimens during the acute phase of infection. The lowest concentration of SARS-CoV-2 viral copies this  assay can detect is 138 copies/mL. A negative result does not preclude SARS-Cov-2 infection and should not be used as the sole basis for treatment or other patient management decisions. A negative result may occur with  improper specimen collection/handling, submission of specimen other than nasopharyngeal swab, presence of viral mutation(s) within the areas targeted by this assay, and inadequate number of viral copies(<138 copies/mL). A negative result must be combined with clinical observations, patient history, and epidemiological information. The expected result is Negative.  Fact Sheet for Patients:  EntrepreneurPulse.com.au  Fact Sheet  for Healthcare Providers:  IncredibleEmployment.be  This test is no t yet approved or cleared by the [place] FDA and  has been authorized for detection and/or diagnosis of SARS-CoV-2 by FDA under an Emergency Use Authorization (EUA). This EUA will remain  in effect (meaning this test can be used) for the duration of the COVID-19 declaration under Section 564(b)(1) of the Act, 21 U.S.C.section 360bbb-3(b)(1), unless the authorization is terminated  or revoked sooner.       Influenza A by PCR NEGATIVE NEGATIVE Final   Influenza B by PCR NEGATIVE NEGATIVE Final    Comment: (NOTE) The Xpert Xpress SARS-CoV-2/FLU/RSV plus assay is intended as an aid in the diagnosis of influenza from Nasopharyngeal swab specimens and should not be used as a sole basis for treatment. Nasal washings and aspirates are unacceptable for Xpert Xpress SARS-CoV-2/FLU/RSV testing.  Fact Sheet for Patients: EntrepreneurPulse.com.au  Fact Sheet for Healthcare Providers: IncredibleEmployment.be  This test is not yet approved or cleared by the [place] FDA and has been authorized for detection and/or diagnosis of SARS-CoV-2 by FDA under an Emergency Use Authorization (EUA). This EUA will remain in effect (meaning this test can be used) for the duration of the COVID-19 declaration under Section 564(b)(1) of the Act, 21 U.S.C. section 360bbb-3(b)(1), unless the authorization is terminated or revoked.  Performed at [facility], [address] [place], [place] [address]   Blood Culture (routine x 2)     Status: None (Preliminary result)   Collection Time: 02/21/2020  7:08 AM   Specimen: BLOOD  Result Value Ref Range Status   Specimen Description BLOOD LEFT [facility]  Final   Special Requests   Final    BOTTLES DRAWN AEROBIC AND ANAEROBIC Blood Culture adequate volume   Culture   Final    NO GROWTH 2 DAYS Performed at [facility],  [address], [place], [place] [address]    Report Status PENDING  Incomplete  Blood Culture (routine x 2)     Status: None (Preliminary result)   Collection Time: 03/09/2020  7:08 AM   Specimen: BLOOD  Result Value Ref Range Status   Specimen Description BLOOD RIGHT HAND  Final   Special Requests   Final    BOTTLES DRAWN AEROBIC AND ANAEROBIC Blood Culture adequate volume   Culture   Final    NO GROWTH 2 DAYS Performed at [facility], [address], [place], [place] [address]    Report Status PENDING  Incomplete  Urine culture     Status: None   Collection Time: 02/16/2020  7:10 AM   Specimen: In/Out Cath Urine  Result Value Ref Range Status   Specimen Description   Final    IN/OUT CATH URINE Performed at [facility], [address], [place], [place] [address]    Special Requests   Final    NONE Performed at [facility], [address] [place], [place]  [address]    Culture   Final    NO GROWTH Performed at [facility], [address], [place], [place] [address]    Report Status 03/14/2020 FINAL  Final  MRSA PCR Screening     Status: None   Collection Time: 03/15/2020 10:43 AM   Specimen: Nasopharyngeal  Result Value Ref Range Status   MRSA by PCR NEGATIVE NEGATIVE Final    Comment:        The GeneXpert MRSA Assay (FDA approved for NASAL specimens only), is one component of a comprehensive MRSA colonization surveillance program. It is not intended to diagnose MRSA infection nor to guide or monitor treatment for MRSA infections. Performed at [facility], [address] [place], [place] [address]     Coagulation Studies: Recent Labs    02/27/2020 0700 02/21/2020 1115  LABPROT 14.7 15.0  INR 1.2 1.2    Urinalysis: Recent Labs    03/12/2020 0710  COLORURINE YELLOW*  LABSPEC 1.014  PHURINE 5.0  GLUCOSEU 50*  HGBUR SMALL*  BILIRUBINUR NEGATIVE  KETONESUR NEGATIVE  PROTEINUR >=300*  NITRITE NEGATIVE   LEUKOCYTESUR NEGATIVE      Imaging: MR BRAIN W WO CONTRAST  Result Date: 03/15/2020 CLINICAL DATA:  34 year old male with encephalopathy. History of paroxysmal atrial fibrillation on Eliquis. Abnormal white matter on head CT recently. EXAM: MRI HEAD WITHOUT AND WITH CONTRAST TECHNIQUE: Multiplanar, multiecho pulse sequences of the brain and surrounding structures were obtained without and with intravenous contrast. CONTRAST:  65m GADAVIST GADOBUTROL 1 MMOL/ML IV SOLN COMPARISON:  Head CT 03/12/2020 and earlier. FINDINGS: Brain: Punctate restricted diffusion at the superior right motor strip with faint T2 and FLAIR hyperintensity (series 5, image 45). Larger 6 mm area of restricted diffusion in the central left pons (series 5, image 16 and series 6, image 16). T2 and FLAIR hyperintensity as well as numerous regional microhemorrhages (series 13, image 21), 1 of which could be associated with the acute lesion. But no pontine edema or mass effect. No other diffusion restriction. Numerous chronic microhemorrhages in the lower brainstem and bilateral cerebellum. Occasional supratentorial chronic micro hemorrhages (right thalamus series 13, image 29). Chronic T2 and FLAIR hyperintensity in the right centrum semiovale tracking to the corona radiata corresponding to the CT finding. No chronic cortical encephalomalacia identified. No abnormal enhancement identified. No dural thickening. No midline shift, mass effect, evidence of mass lesion, ventriculomegaly, extra-axial collection or acute intracranial hemorrhage. Cervicomedullary junction and pituitary are within normal limits. Vascular: Major intracranial vascular flow voids are preserved. The major dural venous sinuses are enhancing and appear to be patent. Skull and upper cervical spine: Negative. Sinuses/Orbits: Negative orbits. Scattered paranasal sinus mucosal thickening. Other: Intubated with fluid in the visible pharynx. Superimposed midline pharyngeal  retention cysts suspected with rim enhancement (series 10, image 4 and series 18, image 20). Otherwise negative visible pharynx. Trace mastoid air cell fluid. Grossly normal visible internal auditory structures. IMPRESSION: 1. 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 Trace cytotoxic edema at the lacunar infarct site, but no pontine vasogenic edema or mass effect to suggest any of these micro-bleeds are acute. 2. There is also a punctate cortical infarct at the superior right motor strip, without hemorrhage or mass effect. 3. Numerous chronic micro-hemorrhages throughout the lower brainstem. Lesser involvement of the bilateral cerebellum, right thalamus. Chronic ischemia in the right frontal lobe white matter corresponding to the recent CT finding. Electronically Signed   By: HHerminio HeadsD.  On: 03/15/2020 13:41   DG Chest Port 1 View  Result Date: 03/14/2020 CLINICAL DATA:  Acute respiratory failure EXAM: PORTABLE CHEST 1 VIEW COMPARISON:  02/19/2020 FINDINGS: Endotracheal tube seen 5.6 cm above the carina. Nasogastric tube extends into the left upper quadrant of the abdomen overlying the proximal body of the stomach. The lungs are symmetrically well expanded and are clear. No pneumothorax or pleural effusion. Cardiac size within normal limits. Pulmonary vascularity is normal. No acute bone abnormality. IMPRESSION: Stable examination. Stable support tubes. No focal pulmonary infiltrate. Electronically Signed   By: Fidela Salisbury MD   On: 03/14/2020 06:00   DG Chest Port 1 View  Result Date: 03/12/2020 CLINICAL DATA:  Acute respiratory failure with hypoxia. EXAM: PORTABLE CHEST 1 VIEW COMPARISON:  Earlier film, same date. FINDINGS: The endotracheal tube is now 2.4 cm above the carina. The NG tube is coursing down the esophagus and into the stomach. External pacer paddles are noted. The heart is within normal limits in size given the AP projection, portable  technique and supine position of the patient. The lungs are clear of an acute process. No pleural effusions or pneumothorax. IMPRESSION: 1. The endotracheal tube is now 2.4 cm above the carina. 2. No acute cardiopulmonary findings. Electronically Signed   By: Marijo Sanes M.D.   On: 03/12/2020 15:11   EEG adult  Result Date: 03/11/2020 Lora Havens, MD     03/02/2020  5:17 PM Patient Name: DYER KLUG MRN: [id number] Epilepsy Attending: Lora Havens Referring Physician/Provider: Dr [name], NP Date: 03/07/2020 Duration: 25.05 mins Patient history: 34yo M s/p cardiac arrest. EEG to evaluate for seizure Level of alertness: comatose AEDs during EEG study: Propofol Technical aspects: This EEG study was done with scalp electrodes positioned according to the 10-20 International system of electrode placement. Electrical activity was acquired at a sampling rate of 500Hz  and reviewed with a high frequency filter of 70Hz  and a low frequency filter of 1Hz . EEG data were recorded continuously and digitally stored. Description: EEG showed burst suppression with bursts of generalized low amplitude 3-5hz  theta-delta slowing lasting 3-5seconds and generalized suppression lasting 4-6 seconds. EEG was not reactive to tactile stimulation. Hyperventilation and photic stimulation were not performed.   ABNORMALITY -Burst suppression, generalized IMPRESSION: This study is suggestive of profoud diffuse encephalopathy, nonspecific etiology but likely related to sedation, anoxic/hypoxic brain injury. No seizures or epileptiform discharges were seen throughout the recording. Priyanka Barbra Sarks     Medications:   . amiodarone 30 mg/hr (03/15/20 1336)  . dextrose 5 % and 0.9% NaCl 75 mL/hr at 03/15/20 1336  . epinephrine Stopped (03/14/20 0912)  . famotidine (PEPCID) IV Stopped (03/15/20 1324)  . feeding supplement (VITAL HIGH PROTEIN) 60 mL/hr at 03/15/20 0845  . fentaNYL infusion INTRAVENOUS 150 mcg/hr  (03/15/20 1336)  . midazolam Stopped (02/19/2020 0950)  . propofol (DIPRIVAN) infusion 30 mcg/kg/min (03/15/20 1336)   . artificial tears  1 application Both Eyes X1G  . chlorhexidine gluconate (MEDLINE KIT)  15 mL Mouth Rinse BID  . Chlorhexidine Gluconate Cloth  6 each Topical Q0600  . Chlorhexidine Gluconate Cloth  6 each Topical Q0600  . feeding supplement (PROSource TF)  45 mL Per Tube Daily  . heparin injection (subcutaneous)  5,000 Units Subcutaneous Q8H  . insulin aspart  0-9 Units Subcutaneous Q4H  . mouth rinse  15 mL Mouth Rinse 10 times per day  . midazolam  2 mg Intravenous Once  . multivitamin  15 mL Per  Tube Daily   acetaminophen, atropine, dextrose, fentaNYL, fentaNYL, midazolam  Assessment/ Plan:  34 y.o. male with a PMHx of aortic root dilatation, chronic kidney disease stage IIIb, depression, congestive heart failure ejection fraction 20 to 25%, hypertension, paroxysmal atrial fibrillation, history of cocaine use, who was admitted to [facility] on 03/09/2020 for evaluation of cardiac arrest.   1.  Acute kidney injury/chronic kidney disease stage IIIb baseline creatinine 2.2 with an EGFR of 38.  Suspect acute kidney injury now is related to cardiac arrest and subsequent renal ischemia. -Renal function appears to be a bit worse.  Creatinine up to 3.15.  Urine output was 1.4 L over the preceding 24 hours.  No acute indication for dialysis.  Maintain the patient on supportive care at this time.  2.  Acute respiratory failure/ventricular fibrillation cardiac arrest.  Patient underwent hypothermia protocol.  Now rewarmed.  We will need to determine underlying mental status once he is off sedation.     LOS: 2 Jany Buckwalter 12/31/20212:36 PM

## 2020-03-16 ENCOUNTER — Inpatient Hospital Stay: Payer: Medicaid Other

## 2020-03-16 ENCOUNTER — Encounter: Payer: Self-pay | Admitting: Pulmonary Disease

## 2020-03-16 ENCOUNTER — Other Ambulatory Visit: Payer: Self-pay

## 2020-03-16 DIAGNOSIS — I6310 Cerebral infarction due to embolism of unspecified precerebral artery: Secondary | ICD-10-CM

## 2020-03-16 DIAGNOSIS — I5082 Biventricular heart failure: Secondary | ICD-10-CM | POA: Diagnosis not present

## 2020-03-16 DIAGNOSIS — I469 Cardiac arrest, cause unspecified: Secondary | ICD-10-CM | POA: Diagnosis not present

## 2020-03-16 DIAGNOSIS — J9601 Acute respiratory failure with hypoxia: Secondary | ICD-10-CM | POA: Diagnosis not present

## 2020-03-16 DIAGNOSIS — I499 Cardiac arrhythmia, unspecified: Secondary | ICD-10-CM | POA: Diagnosis not present

## 2020-03-16 DIAGNOSIS — I6312 Cerebral infarction due to embolism of basilar artery: Secondary | ICD-10-CM

## 2020-03-16 LAB — GLUCOSE, CAPILLARY
Glucose-Capillary: 100 mg/dL — ABNORMAL HIGH (ref 70–99)
Glucose-Capillary: 100 mg/dL — ABNORMAL HIGH (ref 70–99)
Glucose-Capillary: 131 mg/dL — ABNORMAL HIGH (ref 70–99)
Glucose-Capillary: 89 mg/dL (ref 70–99)
Glucose-Capillary: 89 mg/dL (ref 70–99)
Glucose-Capillary: 99 mg/dL (ref 70–99)

## 2020-03-16 LAB — RENAL FUNCTION PANEL
Albumin: 2.6 g/dL — ABNORMAL LOW (ref 3.5–5.0)
Anion gap: 8 (ref 5–15)
BUN: 28 mg/dL — ABNORMAL HIGH (ref 6–20)
CO2: 22 mmol/L (ref 22–32)
Calcium: 8.4 mg/dL — ABNORMAL LOW (ref 8.9–10.3)
Chloride: 110 mmol/L (ref 98–111)
Creatinine, Ser: 3.12 mg/dL — ABNORMAL HIGH (ref 0.61–1.24)
GFR, Estimated: 26 mL/min — ABNORMAL LOW (ref >60)
Glucose, Bld: 126 mg/dL — ABNORMAL HIGH (ref 70–99)
Phosphorus: 3.6 mg/dL (ref 2.5–4.6)
Potassium: 3.8 mmol/L (ref 3.5–5.1)
Sodium: 140 mmol/L (ref 135–145)

## 2020-03-16 LAB — CBC
HCT: 34 % — ABNORMAL LOW (ref 39.0–52.0)
Hemoglobin: 11.1 g/dL — ABNORMAL LOW (ref 13.0–17.0)
MCH: 31 pg (ref 26.0–34.0)
MCHC: 32.6 g/dL (ref 30.0–36.0)
MCV: 95 fL (ref 80.0–100.0)
Platelets: 149 10*3/uL — ABNORMAL LOW (ref 150–400)
RBC: 3.58 MIL/uL — ABNORMAL LOW (ref 4.22–5.81)
RDW: 13.4 % (ref 11.5–15.5)
WBC: 7.5 10*3/uL (ref 4.0–10.5)
nRBC: 0 % (ref 0.0–0.2)

## 2020-03-16 LAB — TRIGLYCERIDES: Triglycerides: 66 mg/dL (ref <150)

## 2020-03-16 LAB — MAGNESIUM: Magnesium: 1.8 mg/dL (ref 1.7–2.4)

## 2020-03-16 LAB — HEPARIN LEVEL (UNFRACTIONATED): Heparin Unfractionated: 0.52 IU/mL (ref 0.30–0.70)

## 2020-03-16 MED ORDER — ONDANSETRON HCL 4 MG/2ML IJ SOLN
INTRAMUSCULAR | Status: AC
  Administered 2020-03-16: 4 mg
  Filled 2020-03-16: qty 2

## 2020-03-16 MED ORDER — POLYETHYLENE GLYCOL 3350 17 G PO PACK: 17.0000 g | PACK | Freq: Every day | ORAL | Status: DC

## 2020-03-16 MED ORDER — IPRATROPIUM-ALBUTEROL 0.5-2.5 (3) MG/3ML IN SOLN
3.0000 mL | Freq: Four times a day (QID) | RESPIRATORY_TRACT | Status: DC
  Administered 2020-03-16 – 2020-03-20 (×16): 3 mL via RESPIRATORY_TRACT
  Filled 2020-03-16 (×14): qty 3

## 2020-03-16 MED ORDER — ACETAMINOPHEN 160 MG/5ML PO SOLN
650.0000 mg | Freq: Four times a day (QID) | ORAL | Status: DC | PRN
  Administered 2020-03-16: 650 mg via ORAL
  Filled 2020-03-16 (×2): qty 20.3

## 2020-03-16 MED ORDER — HYDRALAZINE HCL 20 MG/ML IJ SOLN
20.0000 mg | INTRAMUSCULAR | Status: DC | PRN
  Administered 2020-03-17 – 2020-03-19 (×4): 20 mg via INTRAVENOUS
  Filled 2020-03-16 (×4): qty 1

## 2020-03-16 MED ORDER — NITROGLYCERIN IN D5W 200-5 MCG/ML-% IV SOLN
0.0000 ug/min | INTRAVENOUS | Status: DC
  Administered 2020-03-16: 10 ug/min via INTRAVENOUS
  Filled 2020-03-16: qty 250

## 2020-03-16 MED ORDER — HYDRALAZINE HCL 20 MG/ML IJ SOLN: 20.0000 mg | Freq: Once | INTRAMUSCULAR | Status: AC

## 2020-03-16 MED ORDER — HEPARIN (PORCINE) 25000 UT/250ML-% IV SOLN
1100.0000 [IU]/h | INTRAVENOUS | Status: DC
  Administered 2020-03-16: 1150 [IU]/h via INTRAVENOUS

## 2020-03-16 MED ORDER — LORAZEPAM 2 MG/ML IJ SOLN
INTRAMUSCULAR | Status: AC
  Administered 2020-03-16: 2 mg via INTRAVENOUS
  Filled 2020-03-16: qty 1

## 2020-03-16 MED ORDER — ONDANSETRON HCL 4 MG/2ML IJ SOLN: 4.0000 mg | Freq: Four times a day (QID) | INTRAMUSCULAR | Status: DC | PRN

## 2020-03-16 MED ORDER — MIDAZOLAM HCL 2 MG/2ML IJ SOLN: 2.0000 mg | Freq: Once | INTRAMUSCULAR | Status: DC

## 2020-03-16 MED ORDER — METOCLOPRAMIDE HCL 5 MG/ML IJ SOLN
5.0000 mg | Freq: Three times a day (TID) | INTRAMUSCULAR | Status: AC
  Administered 2020-03-16 – 2020-03-19 (×9): 5 mg via INTRAVENOUS
  Filled 2020-03-16 (×9): qty 2

## 2020-03-16 MED ORDER — DOCUSATE SODIUM 50 MG/5ML PO LIQD
100.0000 mg | Freq: Two times a day (BID) | ORAL | Status: DC
  Administered 2020-03-16: 100 mg via ORAL
  Filled 2020-03-16: qty 10

## 2020-03-16 MED ORDER — HYDRALAZINE HCL 20 MG/ML IJ SOLN
INTRAMUSCULAR | Status: AC
  Administered 2020-03-16: 20 mg via INTRAVENOUS
  Filled 2020-03-16: qty 1

## 2020-03-16 MED ORDER — HEPARIN (PORCINE) 25000 UT/250ML-% IV SOLN
1300.0000 [IU]/h | INTRAVENOUS | Status: DC
  Filled 2020-03-16: qty 250

## 2020-03-16 MED ORDER — LORAZEPAM 2 MG/ML IJ SOLN: 2.0000 mg | Freq: Once | INTRAMUSCULAR | Status: AC

## 2020-03-16 NOTE — Progress Notes (Signed)
Progress Note  Patient Name: Jeremiah Keith Date of Encounter: 03/16/2020  [facility] Cardiologist: [name], MD   Subjective   Rewarmed 2 days ago, uneventful Sedation has been weaned, Some confusion, neurology following Remains intubated Blood pressure elevated at times  MRI brain pontine stroke Anticoagulation initially held  Large emesis this morning   Inpatient Medications    Scheduled Meds: . artificial tears  1 application Both Eyes F7T  . aspirin  325 mg Per Tube Daily  . atorvastatin  40 mg Per Tube QHS  . chlorhexidine gluconate (MEDLINE KIT)  15 mL Mouth Rinse BID  . Chlorhexidine Gluconate Cloth  6 each Topical Q0600  . Chlorhexidine Gluconate Cloth  6 each Topical Q0600  . feeding supplement (PROSource TF)  45 mL Per Tube Daily  . heparin injection (subcutaneous)  5,000 Units Subcutaneous Q12H  . insulin aspart  0-9 Units Subcutaneous Q4H  . ipratropium-albuterol  3 mL Nebulization Q6H  . mouth rinse  15 mL Mouth Rinse 10 times per day  . metoCLOPramide (REGLAN) injection  5 mg Intravenous Q8H  . midazolam  2 mg Intravenous Once  . multivitamin  15 mL Per Tube Daily   Continuous Infusions: . amiodarone 30 mg/hr (03/16/20 1044)  . dextrose 5 % and 0.9% NaCl 75 mL/hr at 03/16/20 1038  . famotidine (PEPCID) IV 20 mg (03/16/20 1131)  . feeding supplement (VITAL HIGH PROTEIN) Stopped (03/16/20 1008)  . fentaNYL infusion INTRAVENOUS 125 mcg/hr (03/16/20 1038)  . midazolam Stopped (02/29/2020 0950)  . propofol (DIPRIVAN) infusion 30 mcg/kg/min (03/16/20 1159)   PRN Meds: acetaminophen, atropine, dextrose, fentaNYL, fentaNYL, midazolam, ondansetron (ZOFRAN) IV   Vital Signs    Vitals:   03/16/20 1000 03/16/20 1100 03/16/20 1137 03/16/20 1200  BP:      Pulse: 81 75 68 65  Resp: 19 20 20 20   Temp: 98.96 F (37.2 C) 98.24 F (36.8 C)  (!) 97.52 F (36.4 C)  TempSrc:      SpO2: 96% 100% 96% 96%  Weight:      Height:         Intake/Output Summary (Last 24 hours) at 03/16/2020 1311 Last data filed at 03/16/2020 1200 Gross per 24 hour  Intake 3557.6 ml  Output 1385 ml  Net 2172.6 ml   Last 3 Weights 03/01/2020 03/11/2020 02/16/2020  Weight (lbs) 234 lb 2.1 oz 222 lb 225 lb  Weight (kg) 106.2 kg 100.699 kg 102.059 kg      Telemetry    Normal sinus rhythm- Personally Reviewed  ECG     - Personally Reviewed  Physical Exam   GEN: No acute distress.  Intubated sedated Neck:  Unable to estimate JVD Cardiac: RRR, no murmurs, rubs, or gallops.  Respiratory:  Bronchial breath sounds, coarse GI: Soft, non-distended  MS: No edema; No deformity. Neuro:   Unable to participate clear to auscultate Psych: Confused, minimally arousable  Labs    High Sensitivity Troponin:   Recent Labs  Lab 02/23/2020 0700 03/07/2020 0903 02/29/2020 1237 03/12/2020 1708 02/22/2020 2043  TROPONINIHS 22* 47* 106* 126* 136*      Chemistry Recent Labs  Lab 03/11/20 0948 03/12/2020 0700 02/21/2020 1237 02/23/2020 1708 02/17/2020 2043 03/14/20 0334 03/15/20 0513 03/16/20 0557  NA 141 137   < > 141   < > 141 141 140  K 4.4 3.5   < > 4.1   < > 4.8 4.2 3.8  CL 107* 103   < > 109   < >  110 111 110  CO2 19* 21*   < > 21*   < > 21* 20* 22  GLUCOSE 115* 263*   < > 217*   < > 122* 94 126*  BUN 23* 29*   < > 33*   < > 29* 26* 28*  CREATININE 2.41* 2.96*   < > 2.92*   < > 2.64* 3.15* 3.12*  CALCIUM 9.5 8.8*   < > 7.9*   < > 8.8* 8.8* 8.4*  PROT  --  6.8  --  5.7*  --   --   --   --   ALBUMIN  --  3.9  --  3.3*  --   --   --  2.6*  AST  --  28  --  22  --   --   --   --   ALT  --  19  --  23  --   --   --   --   ALKPHOS  --  72  --  65  --   --   --   --   BILITOT  --  1.0  --  0.6  --   --   --   --   GFRNONAA 34* 27*   < > 28*   < > 31* 25* 26*  GFRAA 39*  --   --   --   --   --   --   --   ANIONGAP  --  13   < > 11   < > 10 10 8    < > = values in this interval not displayed.     Hematology Recent Labs  Lab 03/14/20 0334  03/15/20 0513 03/16/20 0556  WBC 13.3* 8.5 7.5  RBC 4.58 3.93* 3.58*  HGB 14.0 12.3* 11.1*  HCT 42.6 37.5* 34.0*  MCV 93.0 95.4 95.0  MCH 30.6 31.3 31.0  MCHC 32.9 32.8 32.6  RDW 12.9 13.2 13.4  PLT 263 167 149*    BNP Recent Labs  Lab 03/08/2020 0700  BNP 229.5*     DDimer No results for input(s): DDIMER in the last 168 hours.   Radiology    EEG  Result Date: 03/15/2020 Lora Havens, MD     03/15/2020  3:28 PM Patient Name: Jeremiah Keith MRN: [id number] Epilepsy Attending: Lora Havens Referring Physician/Provider: Marda Stalker, NP Date: 03/15/2020 Duration: 25.05 mins  Patient history: 35yo M s/p cardiac arrest. EEG to evaluate for seizure  Level of alertness: comatose  AEDs during EEG study: Propofol  Technical aspects: This EEG study was done with scalp electrodes positioned according to the 10-20 International system of electrode placement. Electrical activity was acquired at a sampling rate of 500Hz  and reviewed with a high frequency filter of 70Hz  and a low frequency filter of 1Hz . EEG data were recorded continuously and digitally stored.  Description: EEG showed continuous generalized 2-3Hz  delta slowing as well as intermittent generalized 15-18Hz  beta activity. EEG was not reactive to tactile stimulation. Hyperventilation and photic stimulation were not performed.    ABNORMALITY -Continuous slow, generalized  IMPRESSION: This study is suggestive of severe diffuse encephalopathy, nonspecific etiology but likely related to sedation, anoxic/hypoxic brain injury. No seizures or epileptiform discharges were seen throughout the recording. EEG appears to be improving compared to previous study on 03/06/2020  Lora Havens   MR ANGIO HEAD WO CONTRAST  Result Date: 03/15/2020 CLINICAL DATA:  Stroke follow-up EXAM: MRA HEAD WITHOUT CONTRAST TECHNIQUE:  Angiographic images of the Circle of Willis were obtained using MRA technique without intravenous contrast.  COMPARISON:  None. FINDINGS: POSTERIOR CIRCULATION: --Vertebral arteries: Normal --Inferior cerebellar arteries: Normal. --Basilar artery: Normal. --Superior cerebellar arteries: Normal. --Posterior cerebral arteries: Normal. ANTERIOR CIRCULATION: --Intracranial internal carotid arteries: Normal. --Anterior cerebral arteries (ACA): Normal. --Middle cerebral arteries (MCA): Normal. ANATOMIC VARIANTS: None IMPRESSION: Normal intracranial MRA. Electronically Signed   By: Ulyses Jarred M.D.   On: 03/15/2020 23:11   MR BRAIN W WO CONTRAST  Result Date: 03/15/2020 CLINICAL DATA:  35 year old male with encephalopathy. History of paroxysmal atrial fibrillation on Eliquis. Abnormal white matter on head CT recently. EXAM: MRI HEAD WITHOUT AND WITH CONTRAST TECHNIQUE: Multiplanar, multiecho pulse sequences of the brain and surrounding structures were obtained without and with intravenous contrast. CONTRAST:  82m GADAVIST GADOBUTROL 1 MMOL/ML IV SOLN COMPARISON:  Head CT 02/24/2020 and earlier. FINDINGS: Brain: Punctate restricted diffusion at the superior right motor strip with faint T2 and FLAIR hyperintensity (series 5, image 45). Larger 6 mm area of restricted diffusion in the central left pons (series 5, image 16 and series 6, image 16). T2 and FLAIR hyperintensity as well as numerous regional microhemorrhages (series 13, image 21), 1 of which could be associated with the acute lesion. But no pontine edema or mass effect. No other diffusion restriction. Numerous chronic microhemorrhages in the lower brainstem and bilateral cerebellum. Occasional supratentorial chronic micro hemorrhages (right thalamus series 13, image 29). Chronic T2 and FLAIR hyperintensity in the right centrum semiovale tracking to the corona radiata corresponding to the CT finding. No chronic cortical encephalomalacia identified. No abnormal enhancement identified. No dural thickening. No midline shift, mass effect, evidence of mass lesion,  ventriculomegaly, extra-axial collection or acute intracranial hemorrhage. Cervicomedullary junction and pituitary are within normal limits. Vascular: Major intracranial vascular flow voids are preserved. The major dural venous sinuses are enhancing and appear to be patent. Skull and upper cervical spine: Negative. Sinuses/Orbits: Negative orbits. Scattered paranasal sinus mucosal thickening. Other: Intubated with fluid in the visible pharynx. Superimposed midline pharyngeal retention cysts suspected with rim enhancement (series 10, image 4 and series 18, image 20). Otherwise negative visible pharynx. Trace mastoid air cell fluid. Grossly normal visible internal auditory structures. IMPRESSION: 1. 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 Trace cytotoxic edema at the lacunar infarct site, but no pontine vasogenic edema or mass effect to suggest any of these micro-bleeds are acute. 2. There is also a punctate cortical infarct at the superior right motor strip, without hemorrhage or mass effect. 3. Numerous chronic micro-hemorrhages throughout the lower brainstem. Lesser involvement of the bilateral cerebellum, right thalamus. Chronic ischemia in the right frontal lobe white matter corresponding to the recent CT finding. Electronically Signed   By: HGenevie AnnM.D.   On: 03/15/2020 13:41   DG Chest Port 1 View  Result Date: 03/16/2020 CLINICAL DATA:  Concern for aspiration EXAM: PORTABLE CHEST 1 VIEW COMPARISON:  Prior chest x-ray 03/14/2020 FINDINGS: The patient is intubated. The tip of the endotracheal tube is 3.5 cm above the carina. A gastric tube is present, the tip lies off the field of view. External defibrillator pads have been removed. Stable cardiac and mediastinal contours. Inspiratory volumes are low. No new airspace opacities to suggest aspiration. No pleural effusion or pneumothorax. No acute osseous abnormality. IMPRESSION: 1. Low inspiratory volumes  without evidence of new airspace opacity to suggest aspiration. 2. Support apparatus as above. Electronically Signed   By: HJacqulynn Cadet  M.D.   On: 03/16/2020 10:33    Cardiac Studies   Echo 1. Left ventricular ejection fraction, by estimation, is 20 to 25%. The  left ventricle has severely decreased function. The left ventricle  demonstrates global hypokinesis. There is mild left ventricular  hypertrophy. Left ventricular diastolic parameters  are consistent with Grade I diastolic dysfunction (impaired relaxation).  2. Right ventricular systolic function is severely reduced. The right  ventricular size is normal.  3. The mitral valve is normal in structure. No evidence of mitral valve  regurgitation.  4. The aortic valve is normal in structure. Aortic valve regurgitation is  trivial.  5. Aortic dilatation noted. There is mild dilatation of the aortic root,  measuring 42 mm.   Patient Profile     35 y.o. male with history of persistent Afib, CKD stage IIIb, HTN, and tobacco use who presented with VF arrest.   Assessment & Plan    VF arrest  Driving to the hospital was unresponsive, presenting to fire department , requiring defibrillation x2  Intubated for airway protection   nonsustained VT on arrival, was started on amiodarone infusion  Has completed rewarming CT scan brain with mild hypodensity frontal white matter MRI confirming stroke -Amiodarone held given no further arrhythmia   Cardiomyopathy  Long history of poorly controlled hypertension, Global hypokinesis with acute drop, etiology unclear  Right and left heart catheterization  depending on encephalopathy Currently off sedation, confused -We will need aggressive blood pressure control Given renal dysfunction not a good candidate for ARB, ACE inhibitor,entersto -Consider hydralazine, beta-blocker and nitrates for hypertension   Persistent atrial fibrillation  Maintaining normal sinus rhythm after  cardioversion x2 by fire department  We will continue heparin  -Currently maintaining normal sinus rhythm   Acute on chronic renal failure stage IIIb Renal dysfunction, suspect ATN , getting worse Unable to exclude cardiorenal Will try to obtain CVP May need milrinone if markedly elevated central pressures and if urine output drops off   Total encounter time more than 35 minutes  Greater than 50% was spent in counseling and coordination of care with the patient     For questions or updates, please contact [facility] Please consult [email or web address] for contact info under        Signed, Ida Rogue, MD  03/16/2020, 1:11 PM

## 2020-03-16 NOTE — Progress Notes (Signed)
Neurology Progress Note  Patient ID: Jeremiah Keith is a 35 y.o. with PMHx of  has a past medical history of Aortic root dilatation ([place]), CKD (chronic kidney disease), Depression, History of chicken pox, History of echocardiogram, History of stress test, Hypertension, PAF (paroxysmal atrial fibrillation) ([place]), and Substance abuse ([place]). (cocaine, remote)  Initially consulted for:  Pontine stroke  Subjective: Minimally interactive  Exam: Vitals:   03/16/20 0600 03/16/20 0800  BP:  (!) 103/51  Pulse: 66 65  Resp: 20 20  Temp:  98.6 F (37 C)  SpO2: 98% 97%   Gen: In bed, comfortable. With sedation paused, was waking, but then had emesis which resulted in restarting sedation before I could complete a full neurological eval Resp: non-labored breathing, no grossly audible wheezing Cardiac: Perfusing extremities well  Abd: soft, nt  Neuro: At best, awake, orients slightly to voice, moves LLE > RLE spontaneously. Gaze dysconjugate, pupils equal and reactive to light but slightly irregular (2.5 -> 1 mm). Eyes move to the left better than the right. Corneals, cough, gag intact.  DTR: 2+ and symmetric biceps and patella. With sedation on, toes mute bilaterally   Pertinent Data:  Stabilizing renal function (Cr 3.12 on 03/16/20)  Lab Results  Component Value Date   CHOL 147 02/10/2020   HDL 31 (L) 02/10/2020   LDLCALC 99 02/10/2020   TRIG 66 03/16/2020   CHOLHDL 4.7 02/10/2020   Lab Results  Component Value Date   HGBA1C 5.0 02/23/2020    Lab Results  Component Value Date   TSH 2.870 02/09/2020   Echocardiogram with reduced left ventricular ejection fraction (20 to 25%), severely reduced right ventricular function, normal left atrial size, normal right atrial size,  I have personally reviewed the images obtained: Head CT with likely chronic microvascular changes given his uncontrolled hypertension MRI brain with left pontine stroke as well described by radiology,  punctate lesion in the right motor strip, and numerous microhemorrhages predominantly in the posterior fossa and brainstem again consistent with uncontrolled hypertension; no cortical hemorrhages.  T2 hyperintensities consistent with microvascular disease though the differential can be broad for these changes. MRA brain personally reviewed, unremarkable  12/29 EEG suggestive of profound diffuse encephalopathy without seizures or epileptiform discharges 12/30 EEG (still on propofol and fentanyl) notable for severe diffuse encephalopathy, improving compared to prior without seizure or epileptiform discharges  Impression: This is a 35 year old gentleman with past medical history significant for poorly controlled hypertension complicated by chronic kidney disease, acute decompensated heart failure with reduced ejection fraction, paroxysmal atrial fibrillation now in sinus rhythm, V. 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  Etiology is likely cardioembolic in the setting of his risk factors, though a contribution of small vessel disease cannot be entirely ruled out.  Exam is limited at this time given need for sedation but he does orient to voice and spontaneously move LLE more then RUE with no movement observed in the BUE.   Had an extended discussion with mother at bedside about recovery after the stroke.  We discussed there is a risk of intracerebral hemorrhage with initiation of heparin but risk of further cardioembolic events is higher than risk of hemorrhage given his very poor cardiac function at this time.  We discussed that we will have to see how he continues to recover and that is encouraging that he is able to move his left leg but he may have significant paralysis and gaze impairments due to his stroke.  All  of her questions were answered.  Recommendations: # Pontine stroke, likely cardioembolic  - Stroke labs: Recent TSH and A1c are normal as above, LDL above goal at 99 - Frequent  neuro checks q4hr  - Carotid dopplers pending - Heparin drip, low goal no bolus managed by pharmacy for [facility] given severely reduced EF and high risk of further cardioembolic events. May transition to oral AC after 03/17/2020 from Neuro perspective if HCT remains stable at that time - Stat head CT and page neurology for any sudden change in his neurological status - Atorvastatin 40 mg nightly  - Risk factor modification (medication compliance, smoking cessation, diet, exercise) - Telemetry monitoring; 30 day event monitor on discharge if no arrythmias captured  - Blood pressure goal              - Normotension - PT consult, OT consult, Speech consult, when patient stabilized - Neurology to follow  [place] [phone]   55 minutes of critical care time spent in discussion with family, review of records as above, discussion with cardiology and critical care medicine team

## 2020-03-16 NOTE — Progress Notes (Addendum)
ANTICOAGULATION CONSULT NOTE  Pharmacy Consult for heparin Indication: Afib  Patient Measurements: Heparin Dosing Weight: 88 kg  Labs: Recent Labs    02/20/2020 1708 03/01/2020 1708 03/02/2020 2043 02/21/2020 2043 03/14/20 0334 03/14/20 0948 03/15/20 0513 03/16/20 0556 03/16/20 0557  HGB  --   --   --    < > 14.0  --  12.3* 11.1*  --   HCT  --   --   --   --  42.6  --  37.5* 34.0*  --   PLT  --   --   --   --  263  --  167 149*  --   APTT  --    < > 85*  --  99* 71* 98*  --   --   HEPARINUNFRC  --   --   --   --  1.40*  --  0.68  --   --   CREATININE 2.92*  --  2.93*  --  2.64*  --  3.15*  --  3.12*  TROPONINIHS 126*  --  136*  --   --   --   --   --   --    < > = values in this interval not displayed.    Estimated Creatinine Clearance: 38 mL/min (A) (by C-G formula based on SCr of 3.12 mg/dL (H)).   Medical History: Past Medical History:  Diagnosis Date  . Aortic root dilatation ([place])    a. 01/2020 Echo: Ao root 4.3cm.  . CKD (chronic kidney disease)   . Depression    after death of family members  . History of chicken pox   . History of echocardiogram    a. 01/2020 Echo: EF 55-60%. No rwma. Mild LVH. Triv AI. Ao root 4.3 cm.  . History of stress test    a. 10/2017 MV: EF 40-45%, diff HK. No scar/ischemia-->intermediate risk in setting of depressed EF however, EF 55-60% by echo-->med managed.  . Hypertension   . PAF (paroxysmal atrial fibrillation) ([facility])    a. CHA2DS2VASc = 1-->eliquis.  . Substance abuse [facility])    h/o cocaine abuse     Assessment: 35 year old male s/p cardiac arrest with ROSC requiring multiple defibrillations and epinephrine. Patient was on his way to an appointment for cardioversion for persistent afib. Patient has PTA Eliquis but due to nature of presentation, post-arrest, difficult to assess last dose of Eliquis PTA, however patient has been here since 12/29 and has not received Eliquis inpatient. Patient was previously on heparin drip for ACS/chest  pain. Both aPTT and HL were therapeutic x4. Most recent levels 12/31 HL 0.68, aPTT 98. Previous heparin drip was stopped 12/31 around 1525. Pharmacy has been consulted for heparin dosing and monitoring for Afib.   Goal of Therapy:  Heparin level 0.3-0.7 units/ml (lower end of goal range) Monitor platelets by anticoagulation protocol: Yes   Plan:  Per consult, no bolus and aim for lower end of HL goal. Given patient was previously therapeutic on 1150units/hr, and aiming for lower end of goal range, will initiate heparin at 1150 units/hr --Plt and H&H trending down, continue to monitor --Check HL in 6 hours  --CBC daily per protocol  Sherilyn Banker, PharmD Pharmacy Resident  03/16/2020 2:18 PM

## 2020-03-16 NOTE — Progress Notes (Signed)
NAME:  Jeremiah Keith, MRN:  [id number], DOB:  1985/09/01, LOS: 3 ADMISSION DATE:  03/01/2020, CONSULTATION DATE:  02/14/2020 REFERRING MD:  Dr. Corky Downs, CHIEF COMPLAINT:  Cardiac Arrest   Brief History:  35 y.o. Male with a PMH significant for Paroxsymal Atrial fibrillation, Hypertension, and CKD admitted s/p out of hospital cardiac arrest (V-fib).  Being placed on Targeted Temperature Management @ 36 C. 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  History of Present Illness:  Miquan Tandon is a 35 year old male with a past medical history significant for paroxysmal atrial fibrillation on Eliquis for 4 weeks, chronic kidney disease, and hypertension who presented to [facility] ED on 02/18/2020 following an out of hospital V. fib cardiac arrest.  Apparently he was scheduled to have a cardioversion done today and in route to the appointment his father noted he became unresponsive in the car where he subsequently pulled into an EMS station.  CPR was started, and he received defibrillation x3 for reported V. fib arrest with ROSC obtained.  Estimated time of collapse to CPR is 4 minutes, with ROSC obtained after 6 minutes 43 seconds.  ED course: Upon arrival to the ED he was emergently intubated.  He remained unresponsive therefore he was placed on targeted temperature management at 36 C.  Cardiology was consulted and recommended placing on amiodarone drip.  Initial work-up shows bicarb 21, BUN 29, creatinine 2.96, glucose 263, BNP 229.5, high-sensitivity troponin 22, lactic acid 5.7, WBC 17.2 with neutrophilia, and procalcitonin is negative.  Chest x-ray is negative for any acute cardiopulmonary process.  His SARS-CoV-2 PCR and influenza PCR both negative.  Urinalysis is not consistent with UTI.  Urine drug screen is positive for cannabinoid.  CT head obtained which showed mild hypodensity in the frontal white matter (right greater than left)  which is nonspecific in appearance, along with possible chronic ischemia.  PCCM is asked to admit the patient to ICU for further work-up and treatment of V. fib cardiac arrest.  Post admission to ICU he is now hypotensive requiring vasopressors.  Echocardiogram performed revealing Cardiomyopathy and biventricular failure with EF of 20 to 25%.  Central venous access and arterial line has been placed.  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 Intubated. Endotracheal tube tip in good position between the level the clavicles and carina. Enteric tube courses to the abdomen, tip not included. Partially visible gas in the stomach. Resuscitation pads project over the lower chest. Somewhat low lung volumes. Mediastinal contours remain within normal limits. Allowing for portable technique the lungs are clear. No osseous abnormality identified. 12/29: Abdominal X-ray>>Enteric tube terminates in the stomach, side hole at the gastric fundus. Moderate gastric distension. 12/29: CT Head w/o Contrast>>Mild hypodensity in the frontal white matter right greater than left, not present in 2017. Nonspecific appearance. Possible chronic ischemia however given the patient's age consider MRI of the brain without and with contrast for further evaluation 12/29: 2D  Echocardiogram>>1. Left  ventricular ejection fraction, by estimation, is 20 to 25%. The  left ventricle has severely decreased function. The left ventricle  demonstrates global hypokinesis. There is mild left ventricular  hypertrophy. Left ventricular diastolic parameters  are consistent with Grade I diastolic dysfunction (impaired relaxation).  2. Right ventricular systolic function is severely reduced. The right  ventricular size is normal.  3. The mitral valve is normal in structure. No evidence of mitral valve  regurgitation.  4. The aortic valve is normal in structure. Aortic valve regurgitation is  trivial.  5. Aortic dilatation noted. There is mild dilatation of the aortic root,  measuring 42 mm.  12/29: EEG>>This study is suggestive of profoud diffuse encephalopathy, nonspecific etiology but likely related to sedation, anoxic/hypoxic brain injury. No seizures or epileptiform discharges were seen throughout the recording. 12/31: MRI Brain>>1. 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 Trace cytotoxic edema at the lacunar infarct site, but no pontine vasogenic edema or mass effect to suggest any of these micro-bleeds are acute. 2. There is also a punctate cortical infarct at the superior right motor strip, without hemorrhage or mass effect. 3. Numerous chronic micro-hemorrhages throughout the lower brainstem. Lesser involvement of the bilateral cerebellum, right thalamus. Chronic ischemia in the right frontal lobe white matter corresponding to the recent CT finding. 12/31: EEG>>This study issuggestive ofsevere diffuse encephalopathy, nonspecific etiology but likely related to sedation, anoxic/hypoxic brain injury.No seizures or epileptiform discharges were seen throughout the recording. EEG appears to be improving compared to previous study on 03/06/2020  03/16/2020: Chest x-ray   Micro Data:  12/29: SARS-CoV-2 PCR>>negative 12/29: Influenza  PCR>>negative 12/29: Blood culture x2>> 12/29: Urine>> 12/29: Tracheal aspirate>>  Antimicrobials:  N/A  Interim History / Subjective:  No acute events noted overnight TTM completed 12/30 Sedated with Fentanyl and Propofol, wake-up assessment resulted in large episode of emesis Off pressors  Objective   Blood pressure (!) 103/51, pulse 65, temperature 98.6 F (37 C), temperature source Esophageal, resp. rate 20, height 5' 7"  (1.702 m), weight 106.2 kg, SpO2 97 %.    Vent Mode: PRVC FiO2 (%):  [28 %] 28 % Set Rate:  [20 bmp] 20 bmp Vt Set:  [500 mL] 500 mL PEEP:  [5 cmH20] 5 cmH20   Intake/Output Summary (Last 24 hours) at 03/16/2020 0856 Last data filed at 03/16/2020 0856 Gross per 24 hour  Intake 3421.34 ml  Output 1125 ml  Net 2296.34 ml   Filed Weights   02/27/2020 1027  Weight: 106.2 kg    Examination: General: Critically ill appearing male, laying in bed, intubated and sedated, in NAD HENT: Atraumatic, normocephalic, neck supple, no JVD, ETT in place Lungs: Clear breath sounds bilaterally, no wheezing or rales, synchronous with the vent, even Cardiovascular: Bradycardia, regular rhythm, s1s2, no M/R/G, 2+ distal pulses Abdomen: Soft, nontender, nondistended, no guarding or rebound tenderness, BS+ x4 Extremities: Normal bulk and tone, no deformities, no edema Neuro: Heavily sedated, pupils PERRL Skin: Warm and dry.  No obvious rashes, lesions, or ulcerations  Resolved Hospital Problem list   N/A  Assessment & Plan:   V. Fib Cardiac arrest Cardiogenic Shock Cardiomyopathy, Biventricular failure (EF 25% on Echo 03/02/2020) Paroxsymal Atrial Fibrillation Hx: HTN -Continuous cardiac monitoring -Maintain MAP >65 -Vasopressors as needed to maintain MAP goal>>currently weaned off -Trend lactic acid -Trend Troponin -Targeted Temperature Management @ 36 C completed -Cardiology following, appreciate input -Heparin gtt d/c due to new CVA and risk of hemorrhagic  converions -Unable  to diurese at this time due to  AKI -Echo on 03/04/2020 with: 1. Left ventricular ejection fraction, by estimation, is 20 to 25%. The  left ventricle has severely decreased function. The left ventricle  demonstrates global hypokinesis. There is mild left ventricular  hypertrophy. Left ventricular diastolic parameters  are consistent with Grade I diastolic dysfunction (impaired relaxation).  2. Right ventricular systolic function is severely reduced. The right  ventricular size is normal.  3. The mitral valve is normal in structure. No evidence of mitral valve  regurgitation.  4. The aortic valve is normal in structure. Aortic valve regurgitation is  trivial.  5. Aortic dilatation noted. There is mild dilatation of the aortic root,  measuring 42 mm.  -Urine drug screen positive for cannabinoid   Acute Hypoxic Respiratory Failure in the setting of Cardiac Arrest Suspected Aspiration Pneumonitis -Full vent support, vent settings established and reviewed -Wean FiO2 & PEEP as tolerated to maintain O2 sats >92% -Follow intermittent CXR and ABG as needed -VAP Bundle implemented -Spontaneous breathing trials when respiratory parameters met and mental status permits   Leukocytosis Aspiration pneumonitis ruled out -Monitor fever curve -Trend WBC's & Procalcitonin>> WBC decreased to 8.5 -Procalcitonin is flat, will hold off on empiric ABX for now -Follow pan cultures as above -CXR without evidence of pneumonia -UA not concerning for UTI -Abdominal exam is benign   AKI superimposed on CKD Mild Hyperkalemia>>resolved -Monitor I&O's / urinary output -Follow BMP -Ensure adequate renal perfusion -Avoid nephrotoxic agents as able -Replace electrolytes as indicated -Nephrology consulted, appreciate input    Unresponsive s/p cardiac arrest Concern for possible anoxic brain injury MRI Brain w/ Acute Infarct of the left central Pons with superimposed  numerous pontine micro-hemorrhages   -Targeted Temperature Management @ 36 C completed -Maintain RASS 0 to -1 -Propofol, Fentanyl as needed -Daily wake up assessments  -Neurology consulted, appreciate input   Hyperglycemia -CBG's -Sliding scale insulin -Follow ICU Hypo/hyperglycemia protocol  -Check Hgb A1c   Best practice (evaluated daily)  Diet: NPO, Tube feeds, dietician following Pain/Anxiety/Delirium protocol (if indicated): Fentanyl and Propfol VAP protocol (if indicated): Yes, HOB 30 degrees DVT prophylaxis: SCD's (Heparint d/c due to new CVA) GI prophylaxis: Pepcid IV Glucose control: SSI Mobility: Bedrest Disposition:ICU  Goals of Care:  Last date of multidisciplinary goals of care discussion: 03/15/20 Family and staff present: Pt's spouse udpated at bedside 03/16/2020  summary of discussion: Findings of MRI/EEG conveyed Follow up goals of care discussion due: 03/16/2020, done  code Status: Full Code  Labs   CBC: Recent Labs  Lab 03/11/20 0948 02/28/2020 0700 03/14/20 0334 03/15/20 0513 03/16/20 0556  WBC 9.4 17.2* 13.3* 8.5 7.5  NEUTROABS  --  10.2*  --   --   --   HGB 17.1 15.9 14.0 12.3* 11.1*  HCT 48.6 47.2 42.6 37.5* 34.0*  MCV 88 91.7 93.0 95.4 95.0  PLT 221 254 263 167 149*    Basic Metabolic Panel: Recent Labs  Lab 03/10/2020 1708 02/18/2020 2043 03/14/20 0334 03/15/20 0513 03/16/20 0556 03/16/20 0557  NA 141 142 141 141  --  140  K 4.1 4.9 4.8 4.2  --  3.8  CL 109 109 110 111  --  110  CO2 21* 20* 21* 20*  --  22  GLUCOSE 217* 117* 122* 94  --  126*  BUN 33* 32* 29* 26*  --  28*  CREATININE 2.92* 2.93* 2.64* 3.15*  --  3.12*  CALCIUM 7.9* 8.7* 8.8* 8.8*  --  8.4*  MG  --   --  2.3 1.9 1.8  --   PHOS  --   --  4.9* 3.5  --  3.6   GFR: Estimated Creatinine Clearance: 38 mL/min (A) (by C-G formula based on SCr of 3.12 mg/dL (H)). Recent Labs  Lab 03/12/2020 0700 02/15/2020 0903 03/01/2020 1237 03/14/20 0334 03/15/20 0513 03/16/20 0556   PROCALCITON  --   --  <0.10 0.38 0.38  --   WBC 17.2*  --   --  13.3* 8.5 7.5  LATICACIDVEN 5.7* 1.5  --   --   --   --     Liver Function Tests: Recent Labs  Lab 02/16/2020 0700 03/14/2020 1708 03/16/20 0557  AST 28 22  --   ALT 19 23  --   ALKPHOS 72 65  --   BILITOT 1.0 0.6  --   PROT 6.8 5.7*  --   ALBUMIN 3.9 3.3* 2.6*   No results for input(s): LIPASE, AMYLASE in the last 168 hours. No results for input(s): AMMONIA in the last 168 hours.  ABG    Component Value Date/Time   PHART 7.29 (L) 03/15/2020 0829   PCO2ART 43 03/15/2020 0829   PO2ART 78 (L) 03/15/2020 0829   HCO3 20.7 03/15/2020 0829   ACIDBASEDEF 5.7 (H) 03/15/2020 0829   O2SAT 93.8 03/15/2020 0829     Coagulation Profile: Recent Labs  Lab 02/20/2020 0700 03/07/2020 1115  INR 1.2 1.2    Cardiac Enzymes: No results for input(s): CKTOTAL, CKMB, CKMBINDEX, TROPONINI in the last 168 hours.  HbA1C: Hgb A1c MFr Bld  Date/Time Value Ref Range Status  02/25/2020 12:37 PM 5.0 4.8 - 5.6 % Final    Comment:    (NOTE) Pre diabetes:          5.7%-6.4%  Diabetes:              >6.4%  Glycemic control for   <7.0% adults with diabetes     CBG: Recent Labs  Lab 03/15/20 1604 03/15/20 1947 03/15/20 2336 03/16/20 0328 03/16/20 0724  GLUCAP 75 97 80 99 100*    Review of Systems:   Unable to assess due to Critical illness, intubation, and sedation   Allergies Allergies  Allergen Reactions  . Nsaids     Chronic kidney disease  . Penicillins Rash    Has patient had a PCN reaction causing immediate rash, facial/tongue/throat swelling, SOB or lightheadedness with hypotension: Yes Has patient had a PCN reaction causing severe rash involving mucus membranes or skin necrosis: No Has patient had a PCN reaction that required hospitalization: No Has patient had a PCN reaction occurring within the last 10 years: No If all of the above answers are "NO", then may proceed with Cephalosporin use.     Scheduled  Meds: . artificial tears  1 application Both Eyes E9M  . atorvastatin  40 mg Per Tube QHS  . chlorhexidine gluconate (MEDLINE KIT)  15 mL Mouth Rinse BID  . Chlorhexidine Gluconate Cloth  6 each Topical Q0600  . Chlorhexidine Gluconate Cloth  6 each Topical Q0600  . feeding supplement (PROSource TF)  45 mL Per Tube Daily  . insulin aspart  0-9 Units Subcutaneous Q4H  . ipratropium-albuterol  3 mL Nebulization Q6H  . mouth rinse  15 mL Mouth Rinse 10 times per day  . metoCLOPramide (REGLAN) injection  5 mg Intravenous Q8H  . midazolam  2 mg Intravenous Once  . multivitamin  15 mL Per Tube  Daily   Continuous Infusions: . amiodarone 30 mg/hr (03/16/20 1456)  . dextrose 5 % and 0.9% NaCl 75 mL/hr at 03/16/20 1456  . famotidine (PEPCID) IV Stopped (03/16/20 1201)  . feeding supplement (VITAL HIGH PROTEIN) Stopped (03/16/20 1008)  . fentaNYL infusion INTRAVENOUS 100 mcg/hr (03/16/20 1456)  . heparin 1,150 Units/hr (03/16/20 1456)  . midazolam Stopped (02/23/2020 0950)  . propofol (DIPRIVAN) infusion 30 mcg/kg/min (03/16/20 1815)   PRN Meds:.acetaminophen (TYLENOL) oral liquid 160 mg/5 mL, atropine, dextrose, fentaNYL, fentaNYL, midazolam, ondansetron (ZOFRAN) IV      Critical care time: 40 minutes    The patient is critically ill with multiple organ systems failure and requires high complexity decision making for assessment and support, frequent evaluation and titration of therapies, application of advanced monitoring technologies and extensive interpretation of multiple databases. Critical Care Time devoted to patient care services described in this note is 40 minutes.   Renold Don, MD [facility]   *This note was dictated using voice recognition software/Dragon.  Despite best efforts to proofread, errors can occur which can change the meaning.  Any change was purely unintentional.

## 2020-03-16 NOTE — Progress Notes (Signed)
ANTICOAGULATION CONSULT NOTE  Pharmacy Consult for heparin Indication: Afib  Patient Measurements: Heparin Dosing Weight: 88 kg  Labs: Recent Labs    03/14/20 0334 03/14/20 0948 03/15/20 0513 03/16/20 0556 03/16/20 0557 03/16/20 2049  HGB 14.0  --  12.3* 11.1*  --   --   HCT 42.6  --  37.5* 34.0*  --   --   PLT 263  --  167 149*  --   --   APTT 99* 71* 98*  --   --   --   HEPARINUNFRC 1.40*  --  0.68  --   --  0.52  CREATININE 2.64*  --  3.15*  --  3.12*  --     Estimated Creatinine Clearance: 38 mL/min (A) (by C-G formula based on SCr of 3.12 mg/dL (H)).   Medical History: Past Medical History:  Diagnosis Date  . Aortic root dilatation ([place])    a. 01/2020 Echo: Ao root 4.3cm.  . CKD (chronic kidney disease)   . Depression    after death of family members  . History of chicken pox   . History of echocardiogram    a. 01/2020 Echo: EF 55-60%. No rwma. Mild LVH. Triv AI. Ao root 4.3 cm.  . History of stress test    a. 10/2017 MV: EF 40-45%, diff HK. No scar/ischemia-->intermediate risk in setting of depressed EF however, EF 55-60% by echo-->med managed.  . Hypertension   . PAF (paroxysmal atrial fibrillation) (HCC)    a. CHA2DS2VASc = 1-->eliquis.  . Substance abuse [facility])    h/o cocaine abuse     Assessment: 35 year old male s/p cardiac arrest with ROSC requiring multiple defibrillations and epinephrine. Patient was on his way to an appointment for cardioversion for persistent afib. Patient has PTA Eliquis but due to nature of presentation, post-arrest, difficult to assess last dose of Eliquis PTA, however patient has been here since 12/29 and has not received Eliquis inpatient. Patient was previously on heparin drip for ACS/chest pain. Both aPTT and HL were therapeutic x4. Most recent levels 12/31 HL 0.68, aPTT 98. Previous heparin drip was stopped 12/31 around 1525. Pharmacy has been consulted for heparin dosing and monitoring for Afib.   1/1 20:49 HL 0.52  Goal of  Therapy:  Heparin level 0.3-0.7 units/ml (lower end of goal range) Monitor platelets by anticoagulation protocol: Yes   Plan:  1/1 20:49 HL 0.52 is therapeutic but in upper range. Will decrease heparin to 1100 units/hr --Plt and H&H trending down, continue to monitor --Check HL in 6 hours  --CBC daily per protocol  Paulina Fusi, PharmD, BCPS 03/16/2020 9:56 PM

## 2020-03-16 NOTE — Progress Notes (Signed)
   03/16/20 1700  Clinical Encounter Type  Visited With Patient and family together  Visit Type Initial  Referral From Nurse  Consult/Referral To Chaplain  Chaplain responded to an OR request. When she arrived at the room, Pt's mother was sitting at bedside. Pt is the oldest of her three children, his is married, and has a three yr old son. Pt's baby sister, who is nine prayers for him daily. Before leaving chaplain asked if she could pray with them and Pt's mother said yes. Chaplain prayed and will follow up later. When chaplain comes on the unit she will stop and silently pray.

## 2020-03-16 NOTE — Plan of Care (Signed)
Sedation reduced then held completely for neuro exam this am. Pt did start to open eyes and spontaneously move both legs (left > right), however then was coughing/gagging, then vomited twice. Tube feeding stopped, OG to suction, zofran and versed given, sedation restarted at lower rate. Dr.Bhagat with neurology was able to briefly assess patient prior to sedation restarting.  Reglan started to promote motility. Plan to restart tube feedings after a couple doses of reglan given. Will continue also to wean sedation as tolerated later this afternoon.   Heparin gtt also restarted today per neurology recommendation.   Arctic sun pads removed today. Pt has been remaining normothermic with pads off. Small area of skin breakdown on back found under pads, appears to be secondary to defibrillation as redness is in rectangle shape of defib pad.   Mom at bedside today, full updates provided.  Problem: Education: Goal: Knowledge of General Education information will improve Description: Including pain rating scale, medication(s)/side effects and non-pharmacologic comfort measures Outcome: Not Progressing   Problem: Health Behavior/Discharge Planning: Goal: Ability to manage health-related needs will improve Outcome: Not Progressing   Problem: Clinical Measurements: Goal: Ability to maintain clinical measurements within normal limits will improve Outcome: Not Progressing Goal: Will remain free from infection Outcome: Not Progressing Goal: Diagnostic test results will improve Outcome: Not Progressing Goal: Respiratory complications will improve Outcome: Not Progressing   Problem: Activity: Goal: Risk for activity intolerance will decrease Outcome: Not Progressing   Problem: Nutrition: Goal: Adequate nutrition will be maintained Outcome: Not Progressing   Problem: Pain Managment: Goal: General experience of comfort will improve Outcome: Not Progressing   Problem: Clinical  Measurements: Goal: Cardiovascular complication will be avoided Outcome: Progressing   Problem: Safety: Goal: Ability to remain free from injury will improve Outcome: Progressing   Problem: Skin Integrity: Goal: Risk for impaired skin integrity will decrease Outcome: Progressing

## 2020-03-16 NOTE — Progress Notes (Signed)
[facility]  ROUNDING NOTE   Subjective:  Patient remains critically ill. Still on the ventilator. Currently on sedation. Creatinine about the same at 3.1. Urine output was 1.1 L over the preceding 24 hours.   Objective:  Vital signs in last 24 hours:  Temp:  [98.1 F (36.7 C)-100 F (37.8 C)] 98.6 F (37 C) (01/01 0800) Pulse Rate:  [62-87] 68 (01/01 1137) Resp:  [15-23] 20 (01/01 1137) BP: (103-139)/(51-89) 103/51 (01/01 0800) SpO2:  [93 %-98 %] 96 % (01/01 1137) Arterial Line BP: (106-167)/(42-77) 106/42 (01/01 0800) FiO2 (%):  [28 %] 28 % (01/01 1137)  Weight change:  Filed Weights   02/21/2020 1027  Weight: 106.2 kg    Intake/Output: I/O last 3 completed shifts: In: 5761.4 [I.V.:4556.9; NG/GT:1054.5; IV Piggyback:150] Out: 5643 [Urine:1475]   Intake/Output this shift:  Total I/O In: 432.3 [I.V.:432.3] Out: 285 [Urine:285]  Physical Exam: General:  Critically ill-appearing  Head:  Endotracheal tube in place  Eyes:  Closed  Neck:  Supple  Lungs:   Bilateral rhonchi, vent assisted  Heart:  S1S2 no rubs  Abdomen:   Soft, nontender, bowel sounds present  Extremities:  1+ peripheral edema.  Neurologic:  Intubated, sedated  Skin:  No lesions  Access:  No hemodialysis access    Basic Metabolic Panel: Recent Labs  Lab 03/05/2020 1708 02/24/2020 2043 03/14/20 0334 03/15/20 0513 03/16/20 0556 03/16/20 0557  NA 141 142 141 141  --  140  K 4.1 4.9 4.8 4.2  --  3.8  CL 109 109 110 111  --  110  CO2 21* 20* 21* 20*  --  22  GLUCOSE 217* 117* 122* 94  --  126*  BUN 33* 32* 29* 26*  --  28*  CREATININE 2.92* 2.93* 2.64* 3.15*  --  3.12*  CALCIUM 7.9* 8.7* 8.8* 8.8*  --  8.4*  MG  --   --  2.3 1.9 1.8  --   PHOS  --   --  4.9* 3.5  --  3.6    Liver Function Tests: Recent Labs  Lab 02/19/2020 0700 03/10/2020 1708 03/16/20 0557  AST 28 22  --   ALT 19 23  --   ALKPHOS 72 65  --   BILITOT 1.0 0.6  --   PROT 6.8 5.7*  --   ALBUMIN 3.9 3.3* 2.6*    No results for input(s): LIPASE, AMYLASE in the last 168 hours. No results for input(s): AMMONIA in the last 168 hours.  CBC: Recent Labs  Lab 03/11/20 0948 02/23/2020 0700 03/14/20 0334 03/15/20 0513 03/16/20 0556  WBC 9.4 17.2* 13.3* 8.5 7.5  NEUTROABS  --  10.2*  --   --   --   HGB 17.1 15.9 14.0 12.3* 11.1*  HCT 48.6 47.2 42.6 37.5* 34.0*  MCV 88 91.7 93.0 95.4 95.0  PLT 221 254 263 167 149*    Cardiac Enzymes: No results for input(s): CKTOTAL, CKMB, CKMBINDEX, TROPONINI in the last 168 hours.  BNP: Invalid input(s): POCBNP  CBG: Recent Labs  Lab 03/15/20 1947 03/15/20 2336 03/16/20 0328 03/16/20 0724 03/16/20 1134  GLUCAP 97 80 99 100* 131*    Microbiology: Results for orders placed or performed during the hospital encounter of 02/16/2020  Resp Panel by RT-PCR (Flu A&B, Covid) Nasopharyngeal Swab     Status: None   Collection Time: 03/04/2020  7:00 AM   Specimen: Nasopharyngeal Swab; Nasopharyngeal(NP) swabs in vial transport medium  Result Value Ref Range Status  SARS Coronavirus 2 by RT PCR NEGATIVE NEGATIVE Final    Comment: (NOTE) SARS-CoV-2 target nucleic acids are NOT DETECTED.  The SARS-CoV-2 RNA is generally detectable in upper respiratory specimens during the acute phase of infection. The lowest concentration of SARS-CoV-2 viral copies this assay can detect is 138 copies/mL. A negative result does not preclude SARS-Cov-2 infection and should not be used as the sole basis for treatment or other patient management decisions. A negative result may occur with  improper specimen collection/handling, submission of specimen other than nasopharyngeal swab, presence of viral mutation(s) within the areas targeted by this assay, and inadequate number of viral copies(<138 copies/mL). A negative result must be combined with clinical observations, patient history, and epidemiological information. The expected result is Negative.  Fact Sheet for Patients:   EntrepreneurPulse.com.au  Fact Sheet for Healthcare Providers:  IncredibleEmployment.be  This test is no t yet approved or cleared by the [place] FDA and  has been authorized for detection and/or diagnosis of SARS-CoV-2 by FDA under an Emergency Use Authorization (EUA). This EUA will remain  in effect (meaning this test can be used) for the duration of the COVID-19 declaration under Section 564(b)(1) of the Act, 21 U.S.C.section 360bbb-3(b)(1), unless the authorization is terminated  or revoked sooner.       Influenza A by PCR NEGATIVE NEGATIVE Final   Influenza B by PCR NEGATIVE NEGATIVE Final    Comment: (NOTE) The Xpert Xpress SARS-CoV-2/FLU/RSV plus assay is intended as an aid in the diagnosis of influenza from Nasopharyngeal swab specimens and should not be used as a sole basis for treatment. Nasal washings and aspirates are unacceptable for Xpert Xpress SARS-CoV-2/FLU/RSV testing.  Fact Sheet for Patients: EntrepreneurPulse.com.au  Fact Sheet for Healthcare Providers: IncredibleEmployment.be  This test is not yet approved or cleared by the [place] FDA and has been authorized for detection and/or diagnosis of SARS-CoV-2 by FDA under an Emergency Use Authorization (EUA). This EUA will remain in effect (meaning this test can be used) for the duration of the COVID-19 declaration under Section 564(b)(1) of the Act, 21 U.S.C. section 360bbb-3(b)(1), unless the authorization is terminated or revoked.  Performed at [facility], [address] [place], [place] [address]   Blood Culture (routine x 2)     Status: None (Preliminary result)   Collection Time: 03/14/2020  7:08 AM   Specimen: BLOOD  Result Value Ref Range Status   Specimen Description BLOOD LEFT [facility]  Final   Special Requests   Final    BOTTLES DRAWN AEROBIC AND ANAEROBIC Blood Culture adequate volume   Culture   Final     NO GROWTH 3 DAYS Performed at [facility], [address], [place], [place] [address]    Report Status PENDING  Incomplete  Blood Culture (routine x 2)     Status: None (Preliminary result)   Collection Time: 03/03/2020  7:08 AM   Specimen: BLOOD  Result Value Ref Range Status   Specimen Description BLOOD RIGHT HAND  Final   Special Requests   Final    BOTTLES DRAWN AEROBIC AND ANAEROBIC Blood Culture adequate volume   Culture   Final    NO GROWTH 3 DAYS Performed at [facility], [address] [place], [place] [address]    Report Status PENDING  Incomplete  Urine culture     Status: None   Collection Time: 03/09/2020  7:10 AM   Specimen: In/Out Cath Urine  Result Value Ref Range Status  Specimen Description   Final    IN/OUT CATH URINE Performed at [facility], [address] [place], [place] [address]    Special Requests   Final    NONE Performed at [facility], [address], [place], [place] [address]    Culture   Final    NO GROWTH Performed at [facility], [place] [address], [place], [place] [address]    Report Status 03/14/2020 FINAL  Final  MRSA PCR Screening     Status: None   Collection Time: 03/07/2020 10:43 AM   Specimen: Nasopharyngeal  Result Value Ref Range Status   MRSA by PCR NEGATIVE NEGATIVE Final    Comment:        The GeneXpert MRSA Assay (FDA approved for NASAL specimens only), is one component of a comprehensive MRSA colonization surveillance program. It is not intended to diagnose MRSA infection nor to guide or monitor treatment for MRSA infections. Performed at [facility], [address] [place], [place] [address]     Coagulation Studies: No results for input(s): LABPROT, INR in the last 72 hours.  Urinalysis: No results for input(s): COLORURINE, LABSPEC, PHURINE, GLUCOSEU, HGBUR, BILIRUBINUR, KETONESUR, PROTEINUR, UROBILINOGEN, NITRITE, LEUKOCYTESUR in the last 72  hours.  Invalid input(s): APPERANCEUR    Imaging: EEG  Result Date: 03/15/2020 Lora Havens, MD     03/15/2020  3:28 PM Patient Name: Jeremiah Keith MRN: [id number] Epilepsy Attending: Lora Havens Referring Physician/Provider: Marda Stalker, NP Date: 03/15/2020 Duration: 25.05 mins  Patient history: 35yo M s/p cardiac arrest. EEG to evaluate for seizure  Level of alertness: comatose  AEDs during EEG study: Propofol  Technical aspects: This EEG study was done with scalp electrodes positioned according to the 10-20 International system of electrode placement. Electrical activity was acquired at a sampling rate of 500Hz  and reviewed with a high frequency filter of 70Hz  and a low frequency filter of 1Hz . EEG data were recorded continuously and digitally stored.  Description: EEG showed continuous generalized 2-3Hz  delta slowing as well as intermittent generalized 15-18Hz  beta activity. EEG was not reactive to tactile stimulation. Hyperventilation and photic stimulation were not performed.    ABNORMALITY -Continuous slow, generalized  IMPRESSION: This study is suggestive of severe diffuse encephalopathy, nonspecific etiology but likely related to sedation, anoxic/hypoxic brain injury. No seizures or epileptiform discharges were seen throughout the recording. EEG appears to be improving compared to previous study on 03/04/2020  Lora Havens   MR ANGIO HEAD WO CONTRAST  Result Date: 03/15/2020 CLINICAL DATA:  Stroke follow-up EXAM: MRA HEAD WITHOUT CONTRAST TECHNIQUE: Angiographic images of the Circle of Willis were obtained using MRA technique without intravenous contrast. COMPARISON:  None. FINDINGS: POSTERIOR CIRCULATION: --Vertebral arteries: Normal --Inferior cerebellar arteries: Normal. --Basilar artery: Normal. --Superior cerebellar arteries: Normal. --Posterior cerebral arteries: Normal. ANTERIOR CIRCULATION: --Intracranial internal carotid arteries: Normal. --Anterior  cerebral arteries (ACA): Normal. --Middle cerebral arteries (MCA): Normal. ANATOMIC VARIANTS: None IMPRESSION: Normal intracranial MRA. Electronically Signed   By: Ulyses Jarred M.D.   On: 03/15/2020 23:11   MR BRAIN W WO CONTRAST  Result Date: 03/15/2020 CLINICAL DATA:  35 year old male with encephalopathy. History of paroxysmal atrial fibrillation on Eliquis. Abnormal white matter on head CT recently. EXAM: MRI HEAD WITHOUT AND WITH CONTRAST TECHNIQUE: Multiplanar, multiecho pulse sequences of the brain and surrounding structures were obtained without and with intravenous contrast. CONTRAST:  74m GADAVIST GADOBUTROL 1 MMOL/ML IV SOLN COMPARISON:  Head CT 02/21/2020 and earlier. FINDINGS: Brain:  Punctate restricted diffusion at the superior right motor strip with faint T2 and FLAIR hyperintensity (series 5, image 45). Larger 6 mm area of restricted diffusion in the central left pons (series 5, image 16 and series 6, image 16). T2 and FLAIR hyperintensity as well as numerous regional microhemorrhages (series 13, image 21), 1 of which could be associated with the acute lesion. But no pontine edema or mass effect. No other diffusion restriction. Numerous chronic microhemorrhages in the lower brainstem and bilateral cerebellum. Occasional supratentorial chronic micro hemorrhages (right thalamus series 13, image 29). Chronic T2 and FLAIR hyperintensity in the right centrum semiovale tracking to the corona radiata corresponding to the CT finding. No chronic cortical encephalomalacia identified. No abnormal enhancement identified. No dural thickening. No midline shift, mass effect, evidence of mass lesion, ventriculomegaly, extra-axial collection or acute intracranial hemorrhage. Cervicomedullary junction and pituitary are within normal limits. Vascular: Major intracranial vascular flow voids are preserved. The major dural venous sinuses are enhancing and appear to be patent. Skull and upper cervical spine:  Negative. Sinuses/Orbits: Negative orbits. Scattered paranasal sinus mucosal thickening. Other: Intubated with fluid in the visible pharynx. Superimposed midline pharyngeal retention cysts suspected with rim enhancement (series 10, image 4 and series 18, image 20). Otherwise negative visible pharynx. Trace mastoid air cell fluid. Grossly normal visible internal auditory structures. IMPRESSION: 1. 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 Trace cytotoxic edema at the lacunar infarct site, but no pontine vasogenic edema or mass effect to suggest any of these micro-bleeds are acute. 2. There is also a punctate cortical infarct at the superior right motor strip, without hemorrhage or mass effect. 3. Numerous chronic micro-hemorrhages throughout the lower brainstem. Lesser involvement of the bilateral cerebellum, right thalamus. Chronic ischemia in the right frontal lobe white matter corresponding to the recent CT finding. Electronically Signed   By: Genevie Ann M.D.   On: 03/15/2020 13:41   DG Chest Port 1 View  Result Date: 03/16/2020 CLINICAL DATA:  Concern for aspiration EXAM: PORTABLE CHEST 1 VIEW COMPARISON:  Prior chest x-ray 03/14/2020 FINDINGS: The patient is intubated. The tip of the endotracheal tube is 3.5 cm above the carina. A gastric tube is present, the tip lies off the field of view. External defibrillator pads have been removed. Stable cardiac and mediastinal contours. Inspiratory volumes are low. No new airspace opacities to suggest aspiration. No pleural effusion or pneumothorax. No acute osseous abnormality. IMPRESSION: 1. Low inspiratory volumes without evidence of new airspace opacity to suggest aspiration. 2. Support apparatus as above. Electronically Signed   By: Jacqulynn Cadet M.D.   On: 03/16/2020 10:33     Medications:   . amiodarone 30 mg/hr (03/16/20 1044)  . dextrose 5 % and 0.9% NaCl 75 mL/hr at 03/16/20 1038  . famotidine  (PEPCID) IV 20 mg (03/16/20 1131)  . feeding supplement (VITAL HIGH PROTEIN) 60 mL/hr at 03/15/20 0845  . fentaNYL infusion INTRAVENOUS 125 mcg/hr (03/16/20 1038)  . midazolam Stopped (03/08/2020 0950)  . propofol (DIPRIVAN) infusion 30 mcg/kg/min (03/16/20 1159)   . artificial tears  1 application Both Eyes Y1P  . aspirin  325 mg Per Tube Daily  . atorvastatin  40 mg Per Tube QHS  . chlorhexidine gluconate (MEDLINE KIT)  15 mL Mouth Rinse BID  . Chlorhexidine Gluconate Cloth  6 each Topical Q0600  . Chlorhexidine Gluconate Cloth  6 each Topical Q0600  . feeding supplement (PROSource TF)  45 mL Per Tube Daily  . heparin  injection (subcutaneous)  5,000 Units Subcutaneous Q12H  . insulin aspart  0-9 Units Subcutaneous Q4H  . ipratropium-albuterol  3 mL Nebulization Q6H  . mouth rinse  15 mL Mouth Rinse 10 times per day  . metoCLOPramide (REGLAN) injection  5 mg Intravenous Q8H  . midazolam  2 mg Intravenous Once  . multivitamin  15 mL Per Tube Daily   acetaminophen, atropine, dextrose, fentaNYL, fentaNYL, midazolam, ondansetron (ZOFRAN) IV  Assessment/ Plan:  35 y.o. male with a PMHx of aortic root dilatation, chronic kidney disease stage IIIb, depression, congestive heart failure ejection fraction 20 to 25%, hypertension, paroxysmal atrial fibrillation, history of cocaine use, who was admitted to [facility] on 02/23/2020 for evaluation of cardiac arrest.   1.  Acute kidney injury/chronic kidney disease stage IIIb baseline creatinine 2.2 with an EGFR of 38.  Suspect acute kidney injury now is related to cardiac arrest and subsequent renal ischemia. -Renal function about the same today with a creatinine of 3.12.  Urine output was 1.1 L over the preceding 24 hours.  Therefore at this time no acute indication for dialysis.  Continue to monitor renal parameters closely.  2.  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  CT head reviewed.  Findings concerning for  pontine CVA.  Underwent hypothermia protocol.  Continue ventilatory support.     LOS: 3 Tawan Degroote 1/1/202212:07 PM

## 2020-03-16 DEATH — deceased

## 2020-03-17 ENCOUNTER — Inpatient Hospital Stay: Payer: Medicaid Other

## 2020-03-17 DIAGNOSIS — I472 Ventricular tachycardia: Secondary | ICD-10-CM | POA: Diagnosis not present

## 2020-03-17 DIAGNOSIS — I4901 Ventricular fibrillation: Principal | ICD-10-CM

## 2020-03-17 DIAGNOSIS — J9601 Acute respiratory failure with hypoxia: Secondary | ICD-10-CM | POA: Diagnosis not present

## 2020-03-17 DIAGNOSIS — I4819 Other persistent atrial fibrillation: Secondary | ICD-10-CM

## 2020-03-17 DIAGNOSIS — I469 Cardiac arrest, cause unspecified: Secondary | ICD-10-CM | POA: Diagnosis not present

## 2020-03-17 DIAGNOSIS — I5082 Biventricular heart failure: Secondary | ICD-10-CM | POA: Diagnosis not present

## 2020-03-17 LAB — TRIGLYCERIDES: Triglycerides: 92 mg/dL (ref <150)

## 2020-03-17 LAB — RENAL FUNCTION PANEL
Albumin: 2.6 g/dL — ABNORMAL LOW (ref 3.5–5.0)
Anion gap: 10 (ref 5–15)
BUN: 28 mg/dL — ABNORMAL HIGH (ref 6–20)
CO2: 20 mmol/L — ABNORMAL LOW (ref 22–32)
Calcium: 8.3 mg/dL — ABNORMAL LOW (ref 8.9–10.3)
Chloride: 111 mmol/L (ref 98–111)
Creatinine, Ser: 3.16 mg/dL — ABNORMAL HIGH (ref 0.61–1.24)
GFR, Estimated: 25 mL/min — ABNORMAL LOW (ref >60)
Glucose, Bld: 113 mg/dL — ABNORMAL HIGH (ref 70–99)
Phosphorus: 4.2 mg/dL (ref 2.5–4.6)
Potassium: 4.4 mmol/L (ref 3.5–5.1)
Sodium: 141 mmol/L (ref 135–145)

## 2020-03-17 LAB — CBC
HCT: 33.6 % — ABNORMAL LOW (ref 39.0–52.0)
Hemoglobin: 10.8 g/dL — ABNORMAL LOW (ref 13.0–17.0)
MCH: 30.9 pg (ref 26.0–34.0)
MCHC: 32.1 g/dL (ref 30.0–36.0)
MCV: 96 fL (ref 80.0–100.0)
Platelets: 156 10*3/uL (ref 150–400)
RBC: 3.5 MIL/uL — ABNORMAL LOW (ref 4.22–5.81)
RDW: 13.6 % (ref 11.5–15.5)
WBC: 8.4 10*3/uL (ref 4.0–10.5)
nRBC: 0 % (ref 0.0–0.2)

## 2020-03-17 LAB — BLOOD GAS, ARTERIAL
Acid-base deficit: 5.6 mmol/L — ABNORMAL HIGH (ref 0.0–2.0)
Bicarbonate: 21.5 mmol/L (ref 20.0–28.0)
FIO2: 0.28
MECHVT: 500 mL
O2 Saturation: 93 %
PEEP: 5 cmH2O
Patient temperature: 37
RATE: 20 resp/min
pCO2 arterial: 48 mmHg (ref 32.0–48.0)
pH, Arterial: 7.26 — ABNORMAL LOW (ref 7.350–7.450)
pO2, Arterial: 77 mmHg — ABNORMAL LOW (ref 83.0–108.0)

## 2020-03-17 LAB — HEPARIN LEVEL (UNFRACTIONATED)
Heparin Unfractionated: 0.32 IU/mL (ref 0.30–0.70)
Heparin Unfractionated: 0.27 IU/mL — ABNORMAL LOW (ref 0.30–0.70)
Heparin Unfractionated: 0.34 IU/mL (ref 0.30–0.70)

## 2020-03-17 LAB — MAGNESIUM: Magnesium: 1.6 mg/dL — ABNORMAL LOW (ref 1.7–2.4)

## 2020-03-17 LAB — GLUCOSE, CAPILLARY
Glucose-Capillary: 97 mg/dL (ref 70–99)
Glucose-Capillary: 101 mg/dL — ABNORMAL HIGH (ref 70–99)
Glucose-Capillary: 107 mg/dL — ABNORMAL HIGH (ref 70–99)
Glucose-Capillary: 99 mg/dL (ref 70–99)
Glucose-Capillary: 113 mg/dL — ABNORMAL HIGH (ref 70–99)

## 2020-03-17 MED ORDER — HEPARIN (PORCINE) 25000 UT/250ML-% IV SOLN
1250.0000 [IU]/h | INTRAVENOUS | Status: DC
  Administered 2020-03-17: 1250 [IU]/h via INTRAVENOUS
  Filled 2020-03-17: qty 250

## 2020-03-17 MED ORDER — ACETAMINOPHEN 160 MG/5ML PO SOLN
650.0000 mg | Freq: Four times a day (QID) | ORAL | Status: DC | PRN
  Administered 2020-03-20: 650 mg
  Filled 2020-03-17 (×2): qty 20.3

## 2020-03-17 MED ORDER — HEPARIN BOLUS VIA INFUSION
1000.0000 [IU] | Freq: Once | INTRAVENOUS | Status: AC
  Administered 2020-03-17: 1000 [IU] via INTRAVENOUS
  Filled 2020-03-17: qty 1000

## 2020-03-17 MED ORDER — POLYETHYLENE GLYCOL 3350 17 G PO PACK
17.0000 g | PACK | Freq: Every day | ORAL | Status: DC
  Administered 2020-03-17 – 2020-03-19 (×3): 17 g
  Filled 2020-03-17 (×2): qty 1

## 2020-03-17 MED ORDER — DOCUSATE SODIUM 50 MG/5ML PO LIQD
100.0000 mg | Freq: Two times a day (BID) | ORAL | Status: DC
  Administered 2020-03-17 – 2020-03-19 (×6): 100 mg
  Filled 2020-03-17 (×6): qty 10

## 2020-03-17 MED ORDER — MAGNESIUM SULFATE 4 GM/100ML IV SOLN
4.0000 g | Freq: Once | INTRAVENOUS | Status: AC
  Administered 2020-03-17: 4 g via INTRAVENOUS
  Filled 2020-03-17 (×3): qty 100

## 2020-03-17 MED ORDER — DEXMEDETOMIDINE HCL IN NACL 400 MCG/100ML IV SOLN
0.4000 ug/kg/h | INTRAVENOUS | Status: DC
  Administered 2020-03-17: 0.6 ug/kg/h via INTRAVENOUS
  Administered 2020-03-17: 0.8 ug/kg/h via INTRAVENOUS
  Administered 2020-03-18: 1 ug/kg/h via INTRAVENOUS
  Administered 2020-03-18 (×2): 0.6 ug/kg/h via INTRAVENOUS
  Administered 2020-03-18: 1 ug/kg/h via INTRAVENOUS
  Administered 2020-03-18: 0.6 ug/kg/h via INTRAVENOUS
  Administered 2020-03-19: 0.8 ug/kg/h via INTRAVENOUS
  Administered 2020-03-19: 1.2 ug/kg/h via INTRAVENOUS
  Administered 2020-03-19: 1 ug/kg/h via INTRAVENOUS
  Filled 2020-03-17 (×9): qty 100

## 2020-03-17 NOTE — Progress Notes (Addendum)
ANTICOAGULATION CONSULT NOTE  Pharmacy Consult for heparin Indication: Afib  Patient Measurements: Heparin Dosing Weight: 88 kg  Labs: Recent Labs    03/15/20 0513 03/16/20 0556 03/16/20 0557 03/16/20 2049 03/17/20 0409 03/17/20 1125  HGB 12.3* 11.1*  --   --  10.8*  --   HCT 37.5* 34.0*  --   --  33.6*  --   PLT 167 149*  --   --  156  --   APTT 98*  --   --   --   --   --   HEPARINUNFRC 0.68  --   --  0.52 0.32 0.27*  CREATININE 3.15*  --  3.12*  --  3.16*  --     Estimated Creatinine Clearance: 37.5 mL/min (A) (by C-G formula based on SCr of 3.16 mg/dL (H)).   Medical History: Past Medical History:  Diagnosis Date  . Aortic root dilatation ([place])    a. 01/2020 Echo: Ao root 4.3cm.  . CKD (chronic kidney disease)   . Depression    after death of family members  . History of chicken pox   . History of echocardiogram    a. 01/2020 Echo: EF 55-60%. No rwma. Mild LVH. Triv AI. Ao root 4.3 cm.  . History of stress test    a. 10/2017 MV: EF 40-45%, diff HK. No scar/ischemia-->intermediate risk in setting of depressed EF however, EF 55-60% by echo-->med managed.  . Hypertension   . PAF (paroxysmal atrial fibrillation) (H[facility])    a. CHA2DS2VASc = 1-->eliquis.  . Substance abuse [facility])    h/o cocaine abuse     Assessment: 35 year old male s/p cardiac arrest with ROSC requiring multiple defibrillations and epinephrine. Patient was on his way to an appointment for cardioversion for persistent afib. Patient has PTA Eliquis but due to nature of presentation, post-arrest, difficult to assess last dose of Eliquis PTA, however patient has been here since 12/29 and has not received Eliquis inpatient. Patient was previously on heparin drip for ACS/chest pain. Both aPTT and HL were therapeutic x4. Most recent levels 12/31 HL 0.68, aPTT 98. Previous heparin drip was stopped 12/31 around 1525. Pharmacy has been consulted for heparin dosing and monitoring for Afib.   1/1 2049 HL 0.52 1/2  0409 HL 0.32 1/2 1125 HL 0.27 - subtherapeutic  Goal of Therapy:  Heparin level 0.3-0.7 units/ml (aim low) Monitor platelets by anticoagulation protocol: Yes   Plan:  1/2 1125 HL 0.27 is subtherapeutic  Will give very small bolus 1000 units and increase heparin to 1250 units/hr (likely subtherapeutic from brief pause while at CT)  --H&H still slowing trending down, Plt up to 156.  Continue to monitor --Check HL in 6 hours  --CBC daily per protocol  Lu Duffel, PharmD, BCPS Clinical Pharmacist 03/17/2020 12:03 PM

## 2020-03-17 NOTE — Progress Notes (Signed)
ANTICOAGULATION CONSULT NOTE  Pharmacy Consult for heparin Indication: Afib  Patient Measurements: Heparin Dosing Weight: 88 kg  Labs: Recent Labs    03/15/20 0513 03/16/20 0556 03/16/20 0557 03/16/20 2049 03/17/20 0409 03/17/20 1125 03/17/20 1917  HGB 12.3* 11.1*  --   --  10.8*  --   --   HCT 37.5* 34.0*  --   --  33.6*  --   --   PLT 167 149*  --   --  156  --   --   APTT 98*  --   --   --   --   --   --   HEPARINUNFRC 0.68  --   --    < > 0.32 0.27* 0.34  CREATININE 3.15*  --  3.12*  --  3.16*  --   --    < > = values in this interval not displayed.    Estimated Creatinine Clearance: 37.5 mL/min (A) (by C-G formula based on SCr of 3.16 mg/dL (H)).   Medical History: Past Medical History:  Diagnosis Date  . Aortic root dilatation ([place])    a. 01/2020 Echo: Ao root 4.3cm.  . CKD (chronic kidney disease)   . Depression    after death of family members  . History of chicken pox   . History of echocardiogram    a. 01/2020 Echo: EF 55-60%. No rwma. Mild LVH. Triv AI. Ao root 4.3 cm.  . History of stress test    a. 10/2017 MV: EF 40-45%, diff HK. No scar/ischemia-->intermediate risk in setting of depressed EF however, EF 55-60% by echo-->med managed.  . Hypertension   . PAF (paroxysmal atrial fibrillation) (HCC)    a. CHA2DS2VASc = 1-->eliquis.  . Substance abuse [facility])    h/o cocaine abuse     Assessment: 35 year old male s/p cardiac arrest with ROSC requiring multiple defibrillations and epinephrine. Patient was on his way to an appointment for cardioversion for persistent afib. Patient has PTA Eliquis but due to nature of presentation, post-arrest, difficult to assess last dose of Eliquis PTA, however patient has been here since 12/29 and has not received Eliquis inpatient. Patient was previously on heparin drip for ACS/chest pain. Both aPTT and HL were therapeutic x4. Most recent levels 12/31 HL 0.68, aPTT 98. Previous heparin drip was stopped 12/31 around 1525.  Pharmacy has been consulted for heparin dosing and monitoring for Afib.   1/1 2049 HL 0.52 1/2 0409 HL 0.32 1/2 1125 HL 0.27 - bolus 1000 units, rate increase to 1250 units/hr 1/2 1917 HL 0.34, therapeutic x 1  Goal of Therapy:  Heparin level 0.3-0.7 units/ml (aim low) Monitor platelets by anticoagulation protocol: Yes   Plan:  --1/2 1917 HL 0.34 is therapeutic x 1. --Check HL in 6 hours for confirmation --CBC daily per protocol  Paulina Fusi, PharmD, BCPS 03/17/2020 8:24 PM

## 2020-03-17 NOTE — Progress Notes (Signed)
NAME:  Jeremiah Keith, MRN:  [id number], DOB:  [birth year], LOS: 4 ADMISSION DATE:  03/14/2020, CONSULTATION DATE:  02/27/2020 REFERRING MD:  Dr. Corky Downs, CHIEF COMPLAINT:  Cardiac Arrest   Brief History:  35 y.o. Male with a PMH significant for Paroxsymal Atrial fibrillation, Hypertension, and CKD admitted s/p out of hospital cardiac arrest (V-fib).  Being placed on Targeted Temperature Management @ 36 C. 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  Patient profile:  Jeremiah Keith is a 35 year old male with a past medical history significant for paroxysmal atrial fibrillation on Eliquis for 4 weeks, chronic kidney disease, and hypertension who presented to [facility] ED on 02/28/2020 following an out of hospital V. fib cardiac arrest.  Apparently he was scheduled to have a cardioversion done today and in route to the appointment his father noted he became unresponsive in the car where he subsequently pulled into an EMS station.  CPR was started, and he received defibrillation x3 for reported V. fib arrest with ROSC obtained.  Estimated time of collapse to CPR is 4 minutes, with ROSC obtained after 6 minutes 43 seconds.  ED course: Upon arrival to the ED he was emergently intubated.  He remained unresponsive therefore he was placed on targeted temperature management at 36 C.  Cardiology was consulted and recommended placing on amiodarone drip.  Initial work-up shows bicarb 21, BUN 29, creatinine 2.96, glucose 263, BNP 229.5, high-sensitivity troponin 22, lactic acid 5.7, WBC 17.2 with neutrophilia, and procalcitonin is negative.  Chest x-ray is negative for any acute cardiopulmonary process.  His SARS-CoV-2 PCR and influenza PCR both negative.  Urinalysis is not consistent with UTI.  Urine drug screen is positive for cannabinoid.  CT head obtained which showed mild hypodensity in the frontal white matter (right greater than left) which is  nonspecific in appearance, along with possible chronic ischemia.  Post admission to ICU he is now hypotensive requiring vasopressors.  Echocardiogram performed revealing Cardiomyopathy and biventricular failure with EF of 20 to 25%.  Central venous access and arterial line placed.  Patient underwent targeted hypothermia protocol.  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 Intubated. Endotracheal tube tip in good position between the level the clavicles and carina. Enteric tube courses to the abdomen, tip not included. Partially visible gas in the stomach. Resuscitation pads project over the lower chest. Somewhat low lung volumes. Mediastinal contours remain within normal limits. Allowing for portable technique the lungs are clear. No osseous abnormality identified. 12/29: Abdominal X-ray>>Enteric tube terminates in the stomach, side hole at the gastric fundus. Moderate gastric distension. 12/29: CT Head w/o Contrast>>Mild hypodensity in the frontal white matter right greater than left, not present in 2017. Nonspecific appearance. Possible chronic ischemia however given the patient's age consider MRI of the brain without and with contrast for further evaluation 12/29: 2D Echocardiogram>>1. Left  ventricular ejection  fraction, by estimation, is 20 to 25%. The  left ventricle has severely decreased function. The left ventricle  demonstrates global hypokinesis. There is mild left ventricular  hypertrophy. Left ventricular diastolic parameters  are consistent with Grade I diastolic dysfunction (impaired relaxation).  2. Right ventricular systolic function is severely reduced. The right  ventricular size is normal.  3. The mitral valve is normal in structure. No evidence of mitral valve  regurgitation.  4. The aortic valve is normal in structure. Aortic valve regurgitation is  trivial.  5. Aortic dilatation noted. There is mild dilatation of the aortic root,  measuring 42 mm.  12/29: EEG>>This study is suggestive of profoud diffuse encephalopathy, nonspecific etiology but likely related to sedation, anoxic/hypoxic brain injury. No seizures or epileptiform discharges were seen throughout the recording. 12/31: MRI Brain>>1. 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 Trace cytotoxic edema at the lacunar infarct site, but no pontine vasogenic edema or mass effect to suggest any of these micro-bleeds are acute. 2. There is also a punctate cortical infarct at the superior right motor strip, without hemorrhage or mass effect. 3. Numerous chronic micro-hemorrhages throughout the lower brainstem. Lesser involvement of the bilateral cerebellum, right thalamus. Chronic ischemia in the right frontal lobe white matter corresponding to the recent CT finding. 12/31: EEG>>This study issuggestive ofsevere diffuse encephalopathy, nonspecific etiology but likely related to sedation, anoxic/hypoxic brain injury.No seizures or epileptiform discharges were seen throughout the recording. EEG appears to be improving compared to previous study on 02/16/2020 03/17/2020: No significant change on CT head  03/16/2020: Chest x-ray   Micro Data:   12/29: SARS-CoV-2 PCR>>negative 12/29: Influenza PCR>>negative 12/29: Blood culture x2>> negative 12/29: Urine>> negative 12/29: Tracheal aspirate>> GPC's, pending  Antimicrobials:  N/A  Interim History / Subjective:  Some agitation overnight requiring extra sedation TTM completed 12/30 Sedated with Fentanyl and Propofol, wake-up assessment shows persistent encephalopathy Off pressors  Objective   Blood pressure (!) 109/54, pulse 66, temperature 98.96 F (37.2 C), resp. rate 20, height 5' 7"  (1.702 m), weight 106.2 kg, SpO2 99 %.    Vent Mode: PRVC FiO2 (%):  [28 %] 28 % Set Rate:  [20 bmp] 20 bmp Vt Set:  [500 mL] 500 mL PEEP:  [5 cmH20] 5 cmH20 Plateau Pressure:  [16 cmH20-17 cmH20] 16 cmH20   Intake/Output Summary (Last 24 hours) at 03/17/2020 1025 Last data filed at 03/17/2020 0800 Gross per 24 hour  Intake 4085.96 ml  Output 1680 ml  Net 2405.96 ml   Filed Weights   02/29/2020 1027  Weight: 106.2 kg    Examination: General: Critically ill appearing male, laying in bed, intubated and sedated, in NAD, on wake-up assessment appears encephalopathic HENT: Atraumatic, normocephalic, neck supple, no JVD, ETT in place Lungs: Clear breath sounds bilaterally, no wheezing or rales, synchronous with the vent, even Cardiovascular: Bradycardia, regular rhythm, s1s2, no M/R/G, 2+ distal pulses Abdomen: Soft, nontender, nondistended, no guarding or rebound tenderness, BS+ x4 Extremities: Normal bulk and tone, no deformities, no edema Neuro: Heavily sedated, pupils PERRL Skin: Warm and dry.  No obvious rashes, lesions, or ulcerations  Resolved Hospital Problem list   N/A  Assessment & Plan:   V. Fib Cardiac arrest Cardiogenic Shock Cardiomyopathy, Biventricular failure (EF 25% on Echo 03/15/2020) Paroxsymal Atrial Fibrillation Hx: HTN -Continuous cardiac monitoring -Maintain MAP >65 -Vasopressors as needed to maintain MAP goal>>currently weaned off -Targeted Temperature  Management @ 36 C completed 12/30 -Cardiology following, appreciate input -Heparin gtt started  at low-dose -Diuresis as warranted -Echo on 03/11/2020: LVEF 20 to 25% -On amiodarone for ventricular arrhythmia/A. fib -Hydralazine/nitrates for hypertension   Acute Hypoxic Respiratory Failure in the setting of Cardiac Arrest Suspected Aspiration Pneumonitis -Full vent support, vent settings established and reviewed -Wean FiO2 & PEEP as tolerated to maintain O2 sats >92% -Follow intermittent CXR and ABG as needed -VAP Bundle implemented -SAT/SBT as tolerated   Leukocytosis -resolved Aspiration pneumonitis ruled out -Monitor fever curve -Panculture as needed for fever -CXR without evidence of pneumonia 01/02  AKI superimposed on CKD Mild Hyperkalemia>>resolved -Monitor I&O's / urinary output -Follow BMP -Ensure adequate renal perfusion -Avoid nephrotoxic agents as able -Replace electrolytes as indicated -Nephrology consulted, appreciate input -BUN 28/creatinine 3.16  Unresponsive s/p cardiac arrest 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 CT edema MRI Brain w/ Acute Infarct of the left central Pons Numerous pontine micro-hemorrhages (chronic) -Targeted Temperature Management @ 36 C completed 12/30 -Maintain RASS 0 to -1 -Propofol, Fentanyl as needed -Daily wake up assessments  -Neurology consulting, appreciate input  Hyperglycemia -CBG's -Sliding scale insulin -Follow ICU Hypo/hyperglycemia protocol  -Check Hgb A1c   Best practice (evaluated daily)  Diet: NPO, Tube feeds, dietician following Pain/Anxiety/Delirium protocol (if indicated): Fentanyl and Propfol VAP protocol (if indicated): Yes, HOB 30 degrees DVT prophylaxis: SCDs/low-dose heparin infusion GI prophylaxis: Pepcid IV Glucose control: SSI Mobility: Bedrest Disposition:ICU  Goals of Care:  Last date of multidisciplinary goals of care discussion: 03/15/20 Family and staff present: Pt's father udpated at  bedside 03/17/2020  Summary of discussion: Slow recovery likely, will likely need rehab and a cardiac work-up in future Follow up goals of care discussion due: 03/16/2020, done continue full CODE STATUS Code Status: Full Code  Labs   CBC: Recent Labs  Lab 03/08/2020 0700 03/14/20 0334 03/15/20 0513 03/16/20 0556 03/17/20 0409  WBC 17.2* 13.3* 8.5 7.5 8.4  NEUTROABS 10.2*  --   --   --   --   HGB 15.9 14.0 12.3* 11.1* 10.8*  HCT 47.2 42.6 37.5* 34.0* 33.6*  MCV 91.7 93.0 95.4 95.0 96.0  PLT 254 263 167 149* 161    Basic Metabolic Panel: Recent Labs  Lab 03/09/2020 2043 03/14/20 0334 03/15/20 0513 03/16/20 0556 03/16/20 0557 03/17/20 0409  NA 142 141 141  --  140 141  K 4.9 4.8 4.2  --  3.8 4.4  CL 109 110 111  --  110 111  CO2 20* 21* 20*  --  22 20*  GLUCOSE 117* 122* 94  --  126* 113*  BUN 32* 29* 26*  --  28* 28*  CREATININE 2.93* 2.64* 3.15*  --  3.12* 3.16*  CALCIUM 8.7* 8.8* 8.8*  --  8.4* 8.3*  MG  --  2.3 1.9 1.8  --  1.6*  PHOS  --  4.9* 3.5  --  3.6 4.2   GFR: Estimated Creatinine Clearance: 37.5 mL/min (A) (by C-G formula based on SCr of 3.16 mg/dL (H)). Recent Labs  Lab 03/12/2020 0700 02/24/2020 0903 02/27/2020 1237 03/14/20 0334 03/15/20 0513 03/16/20 0556 03/17/20 0409  PROCALCITON  --   --  <0.10 0.38 0.38  --   --   WBC 17.2*  --   --  13.3* 8.5 7.5 8.4  LATICACIDVEN 5.7* 1.5  --   --   --   --   --     Liver Function Tests: Recent Labs  Lab 02/26/2020 0700 03/08/2020 1708 03/16/20 0557 03/17/20 0409  AST 28 22  --   --  ALT 19 23  --   --   ALKPHOS 72 65  --   --   BILITOT 1.0 0.6  --   --   PROT 6.8 5.7*  --   --   ALBUMIN 3.9 3.3* 2.6* 2.6*   No results for input(s): LIPASE, AMYLASE in the last 168 hours. No results for input(s): AMMONIA in the last 168 hours.  ABG    Component Value Date/Time   PHART 7.29 (L) 03/15/2020 0829   PCO2ART 43 03/15/2020 0829   PO2ART 78 (L) 03/15/2020 0829   HCO3 20.7 03/15/2020 0829   ACIDBASEDEF  5.7 (H) 03/15/2020 0829   O2SAT 93.8 03/15/2020 0829     Coagulation Profile: Recent Labs  Lab 02/21/2020 0700 02/22/2020 1115  INR 1.2 1.2    Cardiac Enzymes: No results for input(s): CKTOTAL, CKMB, CKMBINDEX, TROPONINI in the last 168 hours.  HbA1C: Hgb A1c MFr Bld  Date/Time Value Ref Range Status  03/01/2020 12:37 PM 5.0 4.8 - 5.6 % Final    Comment:    (NOTE) Pre diabetes:          5.7%-6.4%  Diabetes:              >6.4%  Glycemic control for   <7.0% adults with diabetes     CBG: Recent Labs  Lab 03/16/20 1634 03/16/20 2029 03/16/20 2352 03/17/20 0406 03/17/20 0747  GLUCAP 89 89 100* 97 101*    Review of Systems:   Unable to assess due to Critical illness, intubation, and sedation   Allergies Allergies  Allergen Reactions  . Nsaids     Chronic kidney disease  . Penicillins Rash    Has patient had a PCN reaction causing immediate rash, facial/tongue/throat swelling, SOB or lightheadedness with hypotension: Yes Has patient had a PCN reaction causing severe rash involving mucus membranes or skin necrosis: No Has patient had a PCN reaction that required hospitalization: No Has patient had a PCN reaction occurring within the last 10 years: No If all of the above answers are "NO", then may proceed with Cephalosporin use.     Scheduled Meds: . artificial tears  1 application Both Eyes V7C  . atorvastatin  40 mg Per Tube QHS  . chlorhexidine gluconate (MEDLINE KIT)  15 mL Mouth Rinse BID  . Chlorhexidine Gluconate Cloth  6 each Topical Q0600  . Chlorhexidine Gluconate Cloth  6 each Topical Q0600  . docusate  100 mg Per Tube BID  . feeding supplement (PROSource TF)  45 mL Per Tube Daily  . insulin aspart  0-9 Units Subcutaneous Q4H  . ipratropium-albuterol  3 mL Nebulization Q6H  . mouth rinse  15 mL Mouth Rinse 10 times per day  . metoCLOPramide (REGLAN) injection  5 mg Intravenous Q8H  . midazolam  2 mg Intravenous Once  . multivitamin  15 mL Per Tube  Daily  . polyethylene glycol  17 g Per Tube Daily   Continuous Infusions: . amiodarone 30 mg/hr (03/17/20 0653)  . dextrose 5 % and 0.9% NaCl 75 mL/hr at 03/17/20 0653  . famotidine (PEPCID) IV 20 mg (03/17/20 0837)  . feeding supplement (VITAL HIGH PROTEIN) 1,000 mL (03/17/20 0001)  . fentaNYL infusion INTRAVENOUS 150 mcg/hr (03/17/20 0653)  . heparin    . midazolam Stopped (02/26/2020 0950)  . propofol (DIPRIVAN) infusion 40 mcg/kg/min (03/17/20 0653)   PRN Meds:.acetaminophen (TYLENOL) oral liquid 160 mg/5 mL, atropine, dextrose, fentaNYL, fentaNYL, hydrALAZINE, midazolam, ondansetron (ZOFRAN) IV      Critical  care time: 40 minutes    The patient is critically ill with multiple organ systems failure and requires high complexity decision making for assessment and support, frequent evaluation and titration of therapies, application of advanced monitoring technologies and extensive interpretation of multiple databases. Critical Care Time devoted to patient care services described in this note is 40 minutes.   Renold Don, MD [facility]   *This note was dictated using voice recognition software/Dragon.  Despite best efforts to proofread, errors can occur which can change the meaning.  Any change was purely unintentional.

## 2020-03-17 NOTE — Progress Notes (Signed)
[facility]  ROUNDING NOTE   Subjective:  Remains critically ill at the moment. Creatinine currently 3.16 with an EGFR 25. Urine output was 1.3 L over the preceding 24 hours.    Objective:  Vital signs in last 24 hours:  Temp:  [97.7 F (36.5 C)-100.94 F (38.3 C)] 98.42 F (36.9 C) (01/02 1200) Pulse Rate:  [63-96] 75 (01/02 1200) Resp:  [14-22] 20 (01/02 1200) BP: (109-117)/(54-70) 115/59 (01/02 1200) SpO2:  [96 %-99 %] 98 % (01/02 1200) Arterial Line BP: (100-202)/(46-84) 149/59 (01/02 1000) FiO2 (%):  [28 %] 28 % (01/02 1200)  Weight change:  Filed Weights   03/04/2020 1027  Weight: 106.2 kg    Intake/Output: I/O last 3 completed shifts: In: 5982.2 [I.V.:4637.3; NG/GT:1155.7; IV Piggyback:189.2] Out: 2015 [Urine:1815; Emesis/NG output:200]   Intake/Output this shift:  Total I/O In: 1057.8 [I.V.:652; NG/GT:295; IV Piggyback:110.8] Out: 550 [Urine:550]  Physical Exam: General:  Critically ill-appearing  Head:  Endotracheal tube in place  Eyes:  Closed  Neck:  Supple  Lungs:   Bilateral rhonchi, vent assisted  Heart:  S1S2 no rubs  Abdomen:   Soft, nontender, bowel sounds present  Extremities:  1+ peripheral edema.  Neurologic:  Intubated, sedated  Skin:  No lesions  Access:  No hemodialysis access    Basic Metabolic Panel: Recent Labs  Lab 02/23/2020 2043 03/14/20 0334 03/15/20 0513 03/16/20 0556 03/16/20 0557 03/17/20 0409  NA 142 141 141  --  140 141  K 4.9 4.8 4.2  --  3.8 4.4  CL 109 110 111  --  110 111  CO2 20* 21* 20*  --  22 20*  GLUCOSE 117* 122* 94  --  126* 113*  BUN 32* 29* 26*  --  28* 28*  CREATININE 2.93* 2.64* 3.15*  --  3.12* 3.16*  CALCIUM 8.7* 8.8* 8.8*  --  8.4* 8.3*  MG  --  2.3 1.9 1.8  --  1.6*  PHOS  --  4.9* 3.5  --  3.6 4.2    Liver Function Tests: Recent Labs  Lab 02/22/2020 0700 02/25/2020 1708 03/16/20 0557 03/17/20 0409  AST 28 22  --   --   ALT 19 23  --   --   ALKPHOS 72 65  --   --   BILITOT  1.0 0.6  --   --   PROT 6.8 5.7*  --   --   ALBUMIN 3.9 3.3* 2.6* 2.6*   No results for input(s): LIPASE, AMYLASE in the last 168 hours. No results for input(s): AMMONIA in the last 168 hours.  CBC: Recent Labs  Lab 02/27/2020 0700 03/14/20 0334 03/15/20 0513 03/16/20 0556 03/17/20 0409  WBC 17.2* 13.3* 8.5 7.5 8.4  NEUTROABS 10.2*  --   --   --   --   HGB 15.9 14.0 12.3* 11.1* 10.8*  HCT 47.2 42.6 37.5* 34.0* 33.6*  MCV 91.7 93.0 95.4 95.0 96.0  PLT 254 263 167 149* 156    Cardiac Enzymes: No results for input(s): CKTOTAL, CKMB, CKMBINDEX, TROPONINI in the last 168 hours.  BNP: Invalid input(s): POCBNP  CBG: Recent Labs  Lab 03/16/20 2029 03/16/20 2352 03/17/20 0406 03/17/20 0747 03/17/20 1205  GLUCAP 89 100* 97 101* 107*    Microbiology: Results for orders placed or performed during the hospital encounter of 03/04/2020  Resp Panel by RT-PCR (Flu A&B, Covid) Nasopharyngeal Swab     Status: None   Collection Time: 03/02/2020  7:00 AM   Specimen:  Nasopharyngeal Swab; Nasopharyngeal(NP) swabs in vial transport medium  Result Value Ref Range Status   SARS Coronavirus 2 by RT PCR NEGATIVE NEGATIVE Final    Comment: (NOTE) SARS-CoV-2 target nucleic acids are NOT DETECTED.  The SARS-CoV-2 RNA is generally detectable in upper respiratory specimens during the acute phase of infection. The lowest concentration of SARS-CoV-2 viral copies this assay can detect is 138 copies/mL. A negative result does not preclude SARS-Cov-2 infection and should not be used as the sole basis for treatment or other patient management decisions. A negative result may occur with  improper specimen collection/handling, submission of specimen other than nasopharyngeal swab, presence of viral mutation(s) within the areas targeted by this assay, and inadequate number of viral copies(<138 copies/mL). A negative result must be combined with clinical observations, patient history, and  epidemiological information. The expected result is Negative.  Fact Sheet for Patients:  EntrepreneurPulse.com.au  Fact Sheet for Healthcare Providers:  IncredibleEmployment.be  This test is no t yet approved or cleared by the [place] FDA and  has been authorized for detection and/or diagnosis of SARS-CoV-2 by FDA under an Emergency Use Authorization (EUA). This EUA will remain  in effect (meaning this test can be used) for the duration of the COVID-19 declaration under Section 564(b)(1) of the Act, 21 U.S.C.section 360bbb-3(b)(1), unless the authorization is terminated  or revoked sooner.       Influenza A by PCR NEGATIVE NEGATIVE Final   Influenza B by PCR NEGATIVE NEGATIVE Final    Comment: (NOTE) The Xpert Xpress SARS-CoV-2/FLU/RSV plus assay is intended as an aid in the diagnosis of influenza from Nasopharyngeal swab specimens and should not be used as a sole basis for treatment. Nasal washings and aspirates are unacceptable for Xpert Xpress SARS-CoV-2/FLU/RSV testing.  Fact Sheet for Patients: EntrepreneurPulse.com.au  Fact Sheet for Healthcare Providers: IncredibleEmployment.be  This test is not yet approved or cleared by the [place] FDA and has been authorized for detection and/or diagnosis of SARS-CoV-2 by FDA under an Emergency Use Authorization (EUA). This EUA will remain in effect (meaning this test can be used) for the duration of the COVID-19 declaration under Section 564(b)(1) of the Act, 21 U.S.C. section 360bbb-3(b)(1), unless the authorization is terminated or revoked.  Performed at [facility], [address] [place], [place] [address]   Blood Culture (routine x 2)     Status: None (Preliminary result)   Collection Time: 02/23/2020  7:08 AM   Specimen: BLOOD  Result Value Ref Range Status   Specimen Description BLOOD LEFT [facility]  Final   Special Requests   Final     BOTTLES DRAWN AEROBIC AND ANAEROBIC Blood Culture adequate volume   Culture   Final    NO GROWTH 4 DAYS Performed at [facility], [address] [place], [place] [address]    Report Status PENDING  Incomplete  Blood Culture (routine x 2)     Status: None (Preliminary result)   Collection Time: 02/24/2020  7:08 AM   Specimen: BLOOD  Result Value Ref Range Status   Specimen Description BLOOD RIGHT HAND  Final   Special Requests   Final    BOTTLES DRAWN AEROBIC AND ANAEROBIC Blood Culture adequate volume   Culture   Final    NO GROWTH 4 DAYS Performed at [facility], [address], [place], [place] [address]    Report Status PENDING  Incomplete  Urine culture     Status: None   Collection Time: 03/03/2020  7:10 AM   Specimen: In/Out Cath Urine  Result Value Ref Range Status   Specimen Description   Final    IN/OUT CATH URINE Performed at [facility], [address], [place], [place] [address]    Special Requests   Final    NONE Performed at [facility], [address], [place], [place] [address]    Culture   Final    NO GROWTH Performed at [facility], [place] [address] [place], [place] [address]    Report Status 03/14/2020 FINAL  Final  MRSA PCR Screening     Status: None   Collection Time: 03/14/2020 10:43 AM   Specimen: Nasopharyngeal  Result Value Ref Range Status   MRSA by PCR NEGATIVE NEGATIVE Final    Comment:        The GeneXpert MRSA Assay (FDA approved for NASAL specimens only), is one component of a comprehensive MRSA colonization surveillance program. It is not intended to diagnose MRSA infection nor to guide or monitor treatment for MRSA infections. Performed at [facility], [address] [place], [place] [address]   Culture, respiratory (non-expectorated)     Status: None (Preliminary result)   Collection Time: 03/15/20  6:53 PM   Specimen: Tracheal Aspirate; Respiratory  Result Value Ref  Range Status   Specimen Description   Final    TRACHEAL ASPIRATE Performed at [facility], [address] [place], [place] [address]    Special Requests   Final    NONE Performed at [facility], [address] [place], [place] [address]    Gram Stain   Final    FEW WBC PRESENT, PREDOMINANTLY PMN ABUNDANT GRAM NEGATIVE RODS FEW GRAM POSITIVE COCCI IN PAIRS IN CLUSTERS    Culture   Final    CULTURE REINCUBATED FOR BETTER GROWTH Performed at [facility], [place] [address], [place], [place] [address]    Report Status PENDING  Incomplete    Coagulation Studies: No results for input(s): LABPROT, INR in the last 72 hours.  Urinalysis: No results for input(s): COLORURINE, LABSPEC, PHURINE, GLUCOSEU, HGBUR, BILIRUBINUR, KETONESUR, PROTEINUR, UROBILINOGEN, NITRITE, LEUKOCYTESUR in the last 72 hours.  Invalid input(s): APPERANCEUR    Imaging: EEG  Result Date: 03/15/2020 Lora Havens, MD     03/15/2020  3:28 PM Patient Name: LINDELL RENFREW MRN: [id number] Epilepsy Attending: Lora Havens Referring Physician/Provider: Marda Stalker, NP Date: 03/15/2020 Duration: 25.05 mins  Patient history: 35yo M s/p cardiac arrest. EEG to evaluate for seizure  Level of alertness: comatose  AEDs during EEG study: Propofol  Technical aspects: This EEG study was done with scalp electrodes positioned according to the 10-20 International system of electrode placement. Electrical activity was acquired at a sampling rate of 500Hz  and reviewed with a high frequency filter of 70Hz  and a low frequency filter of 1Hz . EEG data were recorded continuously and digitally stored.  Description: EEG showed continuous generalized 2-3Hz  delta slowing as well as intermittent generalized 15-18Hz  beta activity. EEG was not reactive to tactile stimulation. Hyperventilation and photic stimulation were not performed.    ABNORMALITY -Continuous slow, generalized  IMPRESSION: This study  is suggestive of severe diffuse encephalopathy, nonspecific etiology but likely related to sedation, anoxic/hypoxic brain injury. No seizures or epileptiform discharges were seen throughout the recording. EEG appears to be improving compared to previous study on 02/22/2020  Lora Havens   DG Abd 1 View  Result Date: 03/16/2020 CLINICAL DATA:  OG  tube placed EXAM: ABDOMEN - 1 VIEW COMPARISON:  None. FINDINGS: The bowel gas pattern is normal. Tip of the OG tube seen within the stomach. No radio-opaque calculi or other significant radiographic abnormality are seen. IMPRESSION: Tip the NG tube in the stomach. Electronically Signed   By: Prudencio Pair M.D.   On: 03/16/2020 22:39   CT HEAD WO CONTRAST  Result Date: 03/17/2020 CLINICAL DATA:  35 year old male status post CPR. Worsening mental status. 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 EXAM: CT HEAD WITHOUT CONTRAST TECHNIQUE: Contiguous axial images were obtained from the base of the skull through the vertex without intravenous contrast. COMPARISON:  Brain MRI 03/15/2020 and earlier. FINDINGS: Brain: Inconspicuous gray-white matter differentiation throughout the brain, although not significantly changed from the head CT 03/10/2020. And bilateral sulci do not appear to have changed. Ventricle size is stable and within normal limits. Increased hypodensity in the left pons corresponding to known infarct. No acute intracranial hemorrhage identified. No new cortically based infarct identified. No intracranial mass effect. Normal basilar cisterns. Vascular: No suspicious intracranial vascular hyperdensity. Skull: No acute osseous abnormality identified. Sinuses/Orbits: Mild sinus opacification in the setting of intubation. Tympanic cavities and mastoids remain clear. Other: No acute orbit or scalp soft tissue finding. Fluid in the pharynx in the setting of intubation. IMPRESSION: 1. No evidence of anoxic injury by CT. Inconspicuous  gray-white matter differentiation throughout the brain, but stable from CT 03/14/2020 that predated the MRI (03/15/2020) which was negative for diffuse anoxic injury. 2. 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 No intracranial hemorrhage or mass effect. Electronically Signed   By: Genevie Ann M.D.   On: 03/17/2020 11:19   MR ANGIO HEAD WO CONTRAST  Result Date: 03/15/2020 CLINICAL DATA:  Stroke follow-up EXAM: MRA HEAD WITHOUT CONTRAST TECHNIQUE: Angiographic images of the Circle of Willis were obtained using MRA technique without intravenous contrast. COMPARISON:  None. FINDINGS: POSTERIOR CIRCULATION: --Vertebral arteries: Normal --Inferior cerebellar arteries: Normal. --Basilar artery: Normal. --Superior cerebellar arteries: Normal. --Posterior cerebral arteries: Normal. ANTERIOR CIRCULATION: --Intracranial internal carotid arteries: Normal. --Anterior cerebral arteries (ACA): Normal. --Middle cerebral arteries (MCA): Normal. ANATOMIC VARIANTS: None IMPRESSION: Normal intracranial MRA. Electronically Signed   By: Ulyses Jarred M.D.   On: 03/15/2020 23:11   DG Chest Port 1 View  Result Date: 03/16/2020 CLINICAL DATA:  Concern for aspiration EXAM: PORTABLE CHEST 1 VIEW COMPARISON:  Prior chest x-ray 03/14/2020 FINDINGS: The patient is intubated. The tip of the endotracheal tube is 3.5 cm above the carina. A gastric tube is present, the tip lies off the field of view. External defibrillator pads have been removed. Stable cardiac and mediastinal contours. Inspiratory volumes are low. No new airspace opacities to suggest aspiration. No pleural effusion or pneumothorax. No acute osseous abnormality. IMPRESSION: 1. Low inspiratory volumes without evidence of new airspace opacity to suggest aspiration. 2. Support apparatus as above. Electronically Signed   By: Jacqulynn Cadet M.D.   On: 03/16/2020 10:33     Medications:   . amiodarone 30 mg/hr (03/17/20 1200)  . dextrose 5 % and 0.9%  NaCl 75 mL/hr at 03/17/20 1200  . famotidine (PEPCID) IV Stopped (03/17/20 0263)  . feeding supplement (VITAL HIGH PROTEIN) 1,000 mL (03/17/20 0001)  . fentaNYL infusion INTRAVENOUS 150 mcg/hr (03/17/20 1200)  . heparin 1,250 Units/hr (03/17/20 1215)  . midazolam Stopped (03/12/2020 0950)  . propofol (DIPRIVAN) infusion 40 mcg/kg/min (03/17/20 1200)   . artificial tears  1 application Both  Eyes Q8H  . atorvastatin  40 mg Per Tube QHS  . chlorhexidine gluconate (MEDLINE KIT)  15 mL Mouth Rinse BID  . Chlorhexidine Gluconate Cloth  6 each Topical Q0600  . Chlorhexidine Gluconate Cloth  6 each Topical Q0600  . docusate  100 mg Per Tube BID  . feeding supplement (PROSource TF)  45 mL Per Tube Daily  . insulin aspart  0-9 Units Subcutaneous Q4H  . ipratropium-albuterol  3 mL Nebulization Q6H  . mouth rinse  15 mL Mouth Rinse 10 times per day  . metoCLOPramide (REGLAN) injection  5 mg Intravenous Q8H  . midazolam  2 mg Intravenous Once  . multivitamin  15 mL Per Tube Daily  . polyethylene glycol  17 g Per Tube Daily   acetaminophen (TYLENOL) oral liquid 160 mg/5 mL, atropine, dextrose, fentaNYL, fentaNYL, hydrALAZINE, midazolam, ondansetron (ZOFRAN) IV  Assessment/ Plan:  35 y.o. male with a PMHx of aortic root dilatation, chronic kidney disease stage IIIb, depression, congestive heart failure ejection fraction 20 to 25%, hypertension, paroxysmal atrial fibrillation, history of cocaine use, who was admitted to [facility] on 03/02/2020 for evaluation of cardiac arrest.   1.  Acute kidney injury/chronic kidney disease stage IIIb baseline creatinine 2.2 with an EGFR of 38.  Suspect acute kidney injury now is related to cardiac arrest and subsequent renal ischemia. -Creatinine about the same today at 3.16 with urine output of 1.3 L.  No indication for dialysis.  Continue supportive care at this time.  2.  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  CT head performed  again today.  No signs of hemorrhagic transformation.  Evolutionary changes in left pontine CVA noted.  Neurology following.     LOS: 4 Jeremiah Keith 1/2/20221:58 PM

## 2020-03-17 NOTE — Progress Notes (Signed)
Neurology Progress Note  Patient ID: Jeremiah Keith is a 35 y.o. with PMHx of  has a past medical history of Aortic root dilatation ([place]), CKD (chronic kidney disease), Depression, History of chicken pox, History of echocardiogram, History of stress test, Hypertension, PAF (paroxysmal atrial fibrillation) ([place]), and Substance abuse ([place]). (cocaine, remote)  Initially consulted for:  Pontine stroke  Subjective: Minimally interactive Febrile overnight to 100.9 (38.3)  Exam: Vitals:   03/17/20 0600 03/17/20 0758  BP:    Pulse: 73 66  Resp: 20 20  Temp: 98.96 F (37.2 C)   SpO2: 99% 99%   Gen: In bed, comfortable. With sedation paused, was waking, but then had emesis which resulted in restarting sedation before I could complete a full neurological eval Resp: non-labored breathing, no grossly audible wheezing Cardiac: Perfusing extremities well  Abd: soft, nt  Neuro: At best, opens eyes to voice. Spontaneous eye movements better in vertical gaze than side to side. Pupils equal and reactive to light but left is slightly irregular (3 -> 1 mm). Eyes move to the left better than the right. Corneals, cough, gag intact.  Sensory motor: Trace movement in BLE to maximal noxious stimulation, not clear if purposeful or triple flexion. No movement in the BUE.  DTR: 3+ and symmetric biceps and patella. Sustained clonus in BLE   Pertinent Data:  Stabilizing renal function (Cr 3.16 on 03/17/20)  Lab Results  Component Value Date   CHOL 147 02/10/2020   HDL 31 (L) 02/10/2020   LDLCALC 99 02/10/2020   TRIG 66 03/16/2020   CHOLHDL 4.7 02/10/2020   Lab Results  Component Value Date   HGBA1C 5.0 02/21/2020    Lab Results  Component Value Date   TSH 2.870 02/09/2020   Echocardiogram with reduced left ventricular ejection fraction (20 to 25%), severely reduced right ventricular function, normal left atrial size, normal right atrial size,   I have personally reviewed the images  obtained: Head CT with likely chronic microvascular changes given his uncontrolled hypertension MRI brain with left pontine stroke as well described by radiology, punctate lesion in the right motor strip, and numerous microhemorrhages predominantly in the posterior fossa and brainstem again consistent with uncontrolled hypertension; no cortical hemorrhages.  T2 hyperintensities consistent with microvascular disease though the differential can be broad for these changes. MRA brain personally reviewed, unremarkable   12/29 EEG suggestive of profound diffuse encephalopathy without seizures or epileptiform discharges 12/30 EEG (still on propofol and fentanyl) notable for severe diffuse encephalopathy, improving compared to prior without seizure or epileptiform discharges  Impression: This is a 35 year old gentleman with past medical history significant for poorly controlled hypertension complicated by chronic kidney disease, acute decompensated heart failure with reduced ejection fraction, paroxysmal atrial fibrillation now in sinus rhythm, V. 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  Etiology is likely cardioembolic in the setting of his risk factors, though a contribution of small vessel disease cannot be entirely ruled out.  Exam is worse today maybe related to more sedation but will pause heparin and rule out hemorrhagic conversion or further insult with repeat HCT  1/2 Updated father on worsening exam, need for repeat imaging, guarded prognosis, likelihood of significant weakness possibly even paralysis though cannot definitively prognosticate. Note, parents are divorced.  Recommendations: # Pontine stroke, likely cardioembolic  - Stroke labs: Recent TSH and A1c are normal as above, LDL above goal at 99 - Frequent neuro checks q4hr  - Stat head CT now, pause heparin drip -  Carotid dopplers still pending - Heparin drip, low goal no bolus managed by pharmacy for [facility] given severely reduced  EF and high risk of further cardioembolic events. May transition to oral AC after 03/17/2020 from Neuro perspective if HCT remains stable at that time - No neurological indication for ASA while on [facility], but should be started if [facility] is stopped  - Atorvastatin 40 mg nightly  - Risk factor modification (medication compliance, smoking cessation, diet, exercise) - Telemetry monitoring; 30 day event monitor on discharge if no arrythmias captured  - Blood pressure goal              - Normotension - PT consult, OT consult, Speech consult, when patient stabilized - Neurology to follow  Lesleigh Noe MD-PhD [facility] [phone]   40 minutes of critical care time spent in discussion with family, review of records as above, discussion with critical care medicine team and nursing  HCT personally reviewed, stable, will resume heparin drip

## 2020-03-17 NOTE — Progress Notes (Signed)
ANTICOAGULATION CONSULT NOTE  Pharmacy Consult for heparin Indication: Afib  Patient Measurements: Heparin Dosing Weight: 88 kg  Labs: Recent Labs    03/14/20 0948 03/15/20 0513 03/15/20 0513 03/16/20 0556 03/16/20 0557 03/16/20 2049 03/17/20 0409  HGB  --  12.3*   < > 11.1*  --   --  10.8*  HCT  --  37.5*  --  34.0*  --   --  33.6*  PLT  --  167  --  149*  --   --  156  APTT 71* 98*  --   --   --   --   --   HEPARINUNFRC  --  0.68  --   --   --  0.52 0.32  CREATININE  --  3.15*  --   --  3.12*  --  3.16*   < > = values in this interval not displayed.    Estimated Creatinine Clearance: 37.5 mL/min (A) (by C-G formula based on SCr of 3.16 mg/dL (H)).   Medical History: Past Medical History:  Diagnosis Date  . Aortic root dilatation ([place])    a. 01/2020 Echo: Ao root 4.3cm.  . CKD (chronic kidney disease)   . Depression    after death of family members  . History of chicken pox   . History of echocardiogram    a. 01/2020 Echo: EF 55-60%. No rwma. Mild LVH. Triv AI. Ao root 4.3 cm.  . History of stress test    a. 10/2017 MV: EF 40-45%, diff HK. No scar/ischemia-->intermediate risk in setting of depressed EF however, EF 55-60% by echo-->med managed.  . Hypertension   . PAF (paroxysmal atrial fibrillation) ([facility])    a. CHA2DS2VASc = 1-->eliquis.  . Substance abuse [facility])    h/o cocaine abuse     Assessment: 35 year old male s/p cardiac arrest with ROSC requiring multiple defibrillations and epinephrine. Patient was on his way to an appointment for cardioversion for persistent afib. Patient has PTA Eliquis but due to nature of presentation, post-arrest, difficult to assess last dose of Eliquis PTA, however patient has been here since 12/29 and has not received Eliquis inpatient. Patient was previously on heparin drip for ACS/chest pain. Both aPTT and HL were therapeutic x4. Most recent levels 12/31 HL 0.68, aPTT 98. Previous heparin drip was stopped 12/31 around 1525.  Pharmacy has been consulted for heparin dosing and monitoring for Afib.   1/1 20:49 HL 0.52 1/2 0409 HL 0.32  Goal of Therapy:  Heparin level 0.3-0.7 units/ml (lower end of goal range) Monitor platelets by anticoagulation protocol: Yes   Plan:  1/2 0409 HL 0.32 is therapeutic but now in lower range. Will split difference and increase heparin to 1200 units/hr --H&H still slowing trending down, Plt up to 156.  Continue to monitor --Check HL in 6 hours  --CBC daily per protocol  Renda Rolls, PharmD, [facility] 03/17/2020 5:24 AM

## 2020-03-17 NOTE — Progress Notes (Incomplete)
Patient transported to CT with RN on transport vent @ 1030. Tolerated well.

## 2020-03-17 NOTE — Progress Notes (Signed)
Progress Note  Patient Name: Jeremiah Keith Date of Encounter: 03/17/2020  [facility] Cardiologist: [name], MD   Subjective   Rewarmed 3 days ago, sedation weaned MRI brain with stroke, pontine (concern for cardioembolic) Large vomit emesis x2 yesterday Started on Reglan Remains intubated Heparin infusion restarted yesterday, held this morning after neurology exam Repeat head CT scan: No anoxic injury, expected evolution of left pontine infarct  Inpatient Medications    Scheduled Meds: . artificial tears  1 application Both Eyes Y4I  . atorvastatin  40 mg Per Tube QHS  . chlorhexidine gluconate (MEDLINE KIT)  15 mL Mouth Rinse BID  . Chlorhexidine Gluconate Cloth  6 each Topical Q0600  . Chlorhexidine Gluconate Cloth  6 each Topical Q0600  . docusate  100 mg Per Tube BID  . feeding supplement (PROSource TF)  45 mL Per Tube Daily  . insulin aspart  0-9 Units Subcutaneous Q4H  . ipratropium-albuterol  3 mL Nebulization Q6H  . mouth rinse  15 mL Mouth Rinse 10 times per day  . metoCLOPramide (REGLAN) injection  5 mg Intravenous Q8H  . midazolam  2 mg Intravenous Once  . multivitamin  15 mL Per Tube Daily  . polyethylene glycol  17 g Per Tube Daily   Continuous Infusions: . amiodarone 30 mg/hr (03/17/20 1400)  . dextrose 5 % and 0.9% NaCl 75 mL/hr at 03/17/20 1400  . famotidine (PEPCID) IV Stopped (03/17/20 3474)  . feeding supplement (VITAL HIGH PROTEIN) 1,000 mL (03/17/20 0001)  . fentaNYL infusion INTRAVENOUS 150 mcg/hr (03/17/20 1400)  . heparin 1,250 Units/hr (03/17/20 1400)  . midazolam Stopped (02/22/2020 0950)  . propofol (DIPRIVAN) infusion 40 mcg/kg/min (03/17/20 1429)   PRN Meds: acetaminophen (TYLENOL) oral liquid 160 mg/5 mL, atropine, dextrose, fentaNYL, fentaNYL, hydrALAZINE, midazolam, ondansetron (ZOFRAN) IV   Vital Signs    Vitals:   03/17/20 1100 03/17/20 1200 03/17/20 1300 03/17/20 1400  BP:  (!) 115/59  (!) 115/59  Pulse: 76  75 73 74  Resp: 19 20 20 20   Temp:  98.42 F (36.9 C) 98.6 F (37 C) 98.78 F (37.1 C)  TempSrc:  Esophageal    SpO2: 98% 98% 97% 96%  Weight:      Height:        Intake/Output Summary (Last 24 hours) at 03/17/2020 1521 Last data filed at 03/17/2020 1400 Gross per 24 hour  Intake 3799.66 ml  Output 1580 ml  Net 2219.66 ml   Last 3 Weights 02/17/2020 03/11/2020 02/16/2020  Weight (lbs) 234 lb 2.1 oz 222 lb 225 lb  Weight (kg) 106.2 kg 100.699 kg 102.059 kg      Telemetry    Normal sinus rhythm- Personally Reviewed  ECG     - Personally Reviewed  Physical Exam   GEN:  Intubated sedated Neck:  Unable to estimate JVD Cardiac: RRR, no murmurs, rubs, or gallops.  Respiratory:  Bronchial breath sounds, coarse GI: Soft, non-distended  MS: No edema; No deformity. Neuro:   Unable to participate  Psych: Sedated  Labs    High Sensitivity Troponin:   Recent Labs  Lab 02/22/2020 0700 02/18/2020 0903 03/08/2020 1237 03/01/2020 1708 03/14/2020 2043  TROPONINIHS 22* 47* 106* 126* 136*      Chemistry Recent Labs  Lab 03/11/20 0948 03/11/20 0948 02/20/2020 0700 02/14/2020 1237 03/11/2020 1708 03/07/2020 2043 03/15/20 0513 03/16/20 0557 03/17/20 0409  NA 141  --  137   < > 141   < > 141 140 141  K  4.4  --  3.5   < > 4.1   < > 4.2 3.8 4.4  CL 107*  --  103   < > 109   < > 111 110 111  CO2 19*  --  21*   < > 21*   < > 20* 22 20*  GLUCOSE 115*  --  263*   < > 217*   < > 94 126* 113*  BUN 23*  --  29*   < > 33*   < > 26* 28* 28*  CREATININE 2.41*  --  2.96*   < > 2.92*   < > 3.15* 3.12* 3.16*  CALCIUM 9.5  --  8.8*   < > 7.9*   < > 8.8* 8.4* 8.3*  PROT  --   --  6.8  --  5.7*  --   --   --   --   ALBUMIN  --    < > 3.9  --  3.3*  --   --  2.6* 2.6*  AST  --   --  28  --  22  --   --   --   --   ALT  --   --  19  --  23  --   --   --   --   ALKPHOS  --   --  72  --  65  --   --   --   --   BILITOT  --   --  1.0  --  0.6  --   --   --   --   GFRNONAA 34*  --  27*   < > 28*   < > 25*  26* 25*  GFRAA 39*  --   --   --   --   --   --   --   --   ANIONGAP  --   --  13   < > 11   < > 10 8 10    < > = values in this interval not displayed.     Hematology Recent Labs  Lab 03/15/20 0513 03/16/20 0556 03/17/20 0409  WBC 8.5 7.5 8.4  RBC 3.93* 3.58* 3.50*  HGB 12.3* 11.1* 10.8*  HCT 37.5* 34.0* 33.6*  MCV 95.4 95.0 96.0  MCH 31.3 31.0 30.9  MCHC 32.8 32.6 32.1  RDW 13.2 13.4 13.6  PLT 167 149* 156    BNP Recent Labs  Lab 03/06/2020 0700  BNP 229.5*     DDimer No results for input(s): DDIMER in the last 168 hours.   Radiology    EEG  Result Date: 03/15/2020 Lora Havens, MD     03/15/2020  3:28 PM Patient Name: DARIN ARNDT MRN: [id number] Epilepsy Attending: Lora Havens Referring Physician/Provider: Marda Stalker, NP Date: 03/15/2020 Duration: 25.05 mins  Patient history: 35yo M s/p cardiac arrest. EEG to evaluate for seizure  Level of alertness: comatose  AEDs during EEG study: Propofol  Technical aspects: This EEG study was done with scalp electrodes positioned according to the 10-20 International system of electrode placement. Electrical activity was acquired at a sampling rate of 500Hz  and reviewed with a high frequency filter of 70Hz  and a low frequency filter of 1Hz . EEG data were recorded continuously and digitally stored.  Description: EEG showed continuous generalized 2-3Hz  delta slowing as well as intermittent generalized 15-18Hz  beta activity. EEG was not reactive to tactile stimulation. Hyperventilation and photic stimulation were not performed.  ABNORMALITY -Continuous slow, generalized  IMPRESSION: This study is suggestive of severe diffuse encephalopathy, nonspecific etiology but likely related to sedation, anoxic/hypoxic brain injury. No seizures or epileptiform discharges were seen throughout the recording. EEG appears to be improving compared to previous study on 03/03/2020  Lora Havens   DG Abd 1 View  Result Date:  03/16/2020 CLINICAL DATA:  OG tube placed EXAM: ABDOMEN - 1 VIEW COMPARISON:  None. FINDINGS: The bowel gas pattern is normal. Tip of the OG tube seen within the stomach. No radio-opaque calculi or other significant radiographic abnormality are seen. IMPRESSION: Tip the NG tube in the stomach. Electronically Signed   By: Prudencio Pair M.D.   On: 03/16/2020 22:39   CT HEAD WO CONTRAST  Result Date: 03/17/2020 CLINICAL DATA:  35 year old male status post CPR. Worsening mental status. 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 EXAM: CT HEAD WITHOUT CONTRAST TECHNIQUE: Contiguous axial images were obtained from the base of the skull through the vertex without intravenous contrast. COMPARISON:  Brain MRI 03/15/2020 and earlier. FINDINGS: Brain: Inconspicuous gray-white matter differentiation throughout the brain, although not significantly changed from the head CT 02/18/2020. And bilateral sulci do not appear to have changed. Ventricle size is stable and within normal limits. Increased hypodensity in the left pons corresponding to known infarct. No acute intracranial hemorrhage identified. No new cortically based infarct identified. No intracranial mass effect. Normal basilar cisterns. Vascular: No suspicious intracranial vascular hyperdensity. Skull: No acute osseous abnormality identified. Sinuses/Orbits: Mild sinus opacification in the setting of intubation. Tympanic cavities and mastoids remain clear. Other: No acute orbit or scalp soft tissue finding. Fluid in the pharynx in the setting of intubation. IMPRESSION: 1. No evidence of anoxic injury by CT. Inconspicuous gray-white matter differentiation throughout the brain, but stable from CT 02/15/2020 that predated the MRI (03/15/2020) which was negative for diffuse anoxic injury. 2. 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 No intracranial hemorrhage or mass effect. Electronically Signed   By: Genevie Ann M.D.   On:  03/17/2020 11:19   MR ANGIO HEAD WO CONTRAST  Result Date: 03/15/2020 CLINICAL DATA:  Stroke follow-up EXAM: MRA HEAD WITHOUT CONTRAST TECHNIQUE: Angiographic images of the Circle of Willis were obtained using MRA technique without intravenous contrast. COMPARISON:  None. FINDINGS: POSTERIOR CIRCULATION: --Vertebral arteries: Normal --Inferior cerebellar arteries: Normal. --Basilar artery: Normal. --Superior cerebellar arteries: Normal. --Posterior cerebral arteries: Normal. ANTERIOR CIRCULATION: --Intracranial internal carotid arteries: Normal. --Anterior cerebral arteries (ACA): Normal. --Middle cerebral arteries (MCA): Normal. ANATOMIC VARIANTS: None IMPRESSION: Normal intracranial MRA. Electronically Signed   By: Ulyses Jarred M.D.   On: 03/15/2020 23:11   DG Chest Port 1 View  Result Date: 03/16/2020 CLINICAL DATA:  Concern for aspiration EXAM: PORTABLE CHEST 1 VIEW COMPARISON:  Prior chest x-ray 03/14/2020 FINDINGS: The patient is intubated. The tip of the endotracheal tube is 3.5 cm above the carina. A gastric tube is present, the tip lies off the field of view. External defibrillator pads have been removed. Stable cardiac and mediastinal contours. Inspiratory volumes are low. No new airspace opacities to suggest aspiration. No pleural effusion or pneumothorax. No acute osseous abnormality. IMPRESSION: 1. Low inspiratory volumes without evidence of new airspace opacity to suggest aspiration. 2. Support apparatus as above. Electronically Signed   By: Jacqulynn Cadet M.D.   On: 03/16/2020 10:33    Cardiac Studies   Echo 1. Left ventricular ejection fraction, by estimation, is 20 to 25%. The  left  ventricle has severely decreased function. The left ventricle  demonstrates global hypokinesis. There is mild left ventricular  hypertrophy. Left ventricular diastolic parameters  are consistent with Grade I diastolic dysfunction (impaired relaxation).  2. Right ventricular systolic function is  severely reduced. The right  ventricular size is normal.  3. The mitral valve is normal in structure. No evidence of mitral valve  regurgitation.  4. The aortic valve is normal in structure. Aortic valve regurgitation is  trivial.  5. Aortic dilatation noted. There is mild dilatation of the aortic root,  measuring 42 mm.   Patient Profile     35 y.o. male with history of persistent Afib, CKD stage IIIb, HTN, and tobacco use who presented with VF arrest.   Assessment & Plan    VF arrest  Driving to the hospital was unresponsive, presenting to fire department , requiring defibrillation x2  Intubated for airway protection   nonsustained VT on arrival, was started on amiodarone infusion ,  Has completed rewarming CT scan brain and MRI confirming pontine stroke   Cardiomyopathy  Long history of poorly controlled hypertension, medication noncompliance Global hypokinesis with acute drop,  Currently off sedation, confused likely exacerbated by stroke and rest Labile blood pressure  Given renal dysfunction not a good candidate for ARB, ACE inhibitor,entersto For the most her blood pressure staying low on no medications  Persistent atrial fibrillation  Maintaining normal sinus rhythm after cardioversion x2 by fire department  We will continue amiodarone Heparin has been on and off depending on neurology recommendations given stroke.  Ideally would like to continue heparin -He is maintaining normal sinus rhythm -Unclear if he was compliant with Eliquis leading to stroke presentation   Acute on chronic renal failure stage IIIb Renal dysfunction, suspect ATN  Unable to exclude cardiorenal -Consider measurement of CVP to determine if inotropes needed   Total encounter time more than 25 minutes  Greater than 50% was spent in counseling and coordination of care with the patient     For questions or updates, please contact [place] Please consult [email or web address] for contact  info under        Signed, Ida Rogue, MD  03/17/2020, 3:21 PM

## 2020-03-18 ENCOUNTER — Inpatient Hospital Stay: Payer: Medicaid Other

## 2020-03-18 DIAGNOSIS — I5082 Biventricular heart failure: Secondary | ICD-10-CM | POA: Diagnosis not present

## 2020-03-18 DIAGNOSIS — I480 Paroxysmal atrial fibrillation: Secondary | ICD-10-CM | POA: Diagnosis not present

## 2020-03-18 LAB — MAGNESIUM
Magnesium: 2.7 mg/dL — ABNORMAL HIGH (ref 1.7–2.4)
Magnesium: 2.5 mg/dL — ABNORMAL HIGH (ref 1.7–2.4)

## 2020-03-18 LAB — CBC
HCT: 34.7 % — ABNORMAL LOW (ref 39.0–52.0)
HCT: 33 % — ABNORMAL LOW (ref 39.0–52.0)
Hemoglobin: 11 g/dL — ABNORMAL LOW (ref 13.0–17.0)
Hemoglobin: 10.6 g/dL — ABNORMAL LOW (ref 13.0–17.0)
MCH: 30.6 pg (ref 26.0–34.0)
MCH: 31.3 pg (ref 26.0–34.0)
MCHC: 31.7 g/dL (ref 30.0–36.0)
MCHC: 32.1 g/dL (ref 30.0–36.0)
MCV: 96.7 fL (ref 80.0–100.0)
MCV: 97.3 fL (ref 80.0–100.0)
Platelets: 163 10*3/uL (ref 150–400)
Platelets: 170 10*3/uL (ref 150–400)
RBC: 3.59 MIL/uL — ABNORMAL LOW (ref 4.22–5.81)
RBC: 3.39 MIL/uL — ABNORMAL LOW (ref 4.22–5.81)
RDW: 13.5 % (ref 11.5–15.5)
RDW: 13.4 % (ref 11.5–15.5)
WBC: 7.3 10*3/uL (ref 4.0–10.5)
WBC: 6.7 10*3/uL (ref 4.0–10.5)
nRBC: 0 % (ref 0.0–0.2)
nRBC: 0 % (ref 0.0–0.2)

## 2020-03-18 LAB — RENAL FUNCTION PANEL
Albumin: 2.6 g/dL — ABNORMAL LOW (ref 3.5–5.0)
Albumin: 2.4 g/dL — ABNORMAL LOW (ref 3.5–5.0)
Anion gap: 5 (ref 5–15)
Anion gap: 5 (ref 5–15)
BUN: 29 mg/dL — ABNORMAL HIGH (ref 6–20)
BUN: 33 mg/dL — ABNORMAL HIGH (ref 6–20)
CO2: 24 mmol/L (ref 22–32)
CO2: 21 mmol/L — ABNORMAL LOW (ref 22–32)
Calcium: 8.5 mg/dL — ABNORMAL LOW (ref 8.9–10.3)
Calcium: 8.4 mg/dL — ABNORMAL LOW (ref 8.9–10.3)
Chloride: 113 mmol/L — ABNORMAL HIGH (ref 98–111)
Chloride: 114 mmol/L — ABNORMAL HIGH (ref 98–111)
Creatinine, Ser: 2.76 mg/dL — ABNORMAL HIGH (ref 0.61–1.24)
Creatinine, Ser: 2.6 mg/dL — ABNORMAL HIGH (ref 0.61–1.24)
GFR, Estimated: 30 mL/min — ABNORMAL LOW (ref >60)
GFR, Estimated: 32 mL/min — ABNORMAL LOW (ref >60)
Glucose, Bld: 124 mg/dL — ABNORMAL HIGH (ref 70–99)
Glucose, Bld: 122 mg/dL — ABNORMAL HIGH (ref 70–99)
Phosphorus: 4.5 mg/dL (ref 2.5–4.6)
Phosphorus: 4.2 mg/dL (ref 2.5–4.6)
Potassium: 4.6 mmol/L (ref 3.5–5.1)
Potassium: 4.4 mmol/L (ref 3.5–5.1)
Sodium: 142 mmol/L (ref 135–145)
Sodium: 140 mmol/L (ref 135–145)

## 2020-03-18 LAB — BLOOD GAS, ARTERIAL
Acid-base deficit: 4.7 mmol/L — ABNORMAL HIGH (ref 0.0–2.0)
Bicarbonate: 20.4 mmol/L (ref 20.0–28.0)
FIO2: 0.28
O2 Saturation: 94.9 %
PEEP: 5 cmH2O
Patient temperature: 37
RATE: 28 resp/min
pCO2 arterial: 37 mmHg (ref 32.0–48.0)
pH, Arterial: 7.35 (ref 7.350–7.450)
pO2, Arterial: 79 mmHg — ABNORMAL LOW (ref 83.0–108.0)

## 2020-03-18 LAB — GLUCOSE, CAPILLARY
Glucose-Capillary: 111 mg/dL — ABNORMAL HIGH (ref 70–99)
Glucose-Capillary: 117 mg/dL — ABNORMAL HIGH (ref 70–99)
Glucose-Capillary: 117 mg/dL — ABNORMAL HIGH (ref 70–99)
Glucose-Capillary: 109 mg/dL — ABNORMAL HIGH (ref 70–99)
Glucose-Capillary: 104 mg/dL — ABNORMAL HIGH (ref 70–99)
Glucose-Capillary: 111 mg/dL — ABNORMAL HIGH (ref 70–99)

## 2020-03-18 LAB — CULTURE, BLOOD (ROUTINE X 2)
Culture: NO GROWTH
Culture: NO GROWTH
Special Requests: ADEQUATE
Special Requests: ADEQUATE

## 2020-03-18 LAB — BLOOD GAS, VENOUS
Acid-base deficit: 4.7 mmol/L — ABNORMAL HIGH (ref 0.0–2.0)
Bicarbonate: 22.1 mmol/L (ref 20.0–28.0)
FIO2: 0.28
O2 Saturation: 78.6 %
Patient temperature: 37
pCO2, Ven: 47 mmHg (ref 44.0–60.0)
pH, Ven: 7.28 (ref 7.250–7.430)
pO2, Ven: 49 mmHg — ABNORMAL HIGH (ref 32.0–45.0)

## 2020-03-18 LAB — CULTURE, RESPIRATORY W GRAM STAIN

## 2020-03-18 LAB — HEPARIN LEVEL (UNFRACTIONATED)
Heparin Unfractionated: 0.28 IU/mL — ABNORMAL LOW (ref 0.30–0.70)
Heparin Unfractionated: 0.29 IU/mL — ABNORMAL LOW (ref 0.30–0.70)
Heparin Unfractionated: 0.45 IU/mL (ref 0.30–0.70)

## 2020-03-18 LAB — TRIGLYCERIDES: Triglycerides: 57 mg/dL (ref <150)

## 2020-03-18 LAB — PHOSPHORUS
Phosphorus: 4.4 mg/dL (ref 2.5–4.6)
Phosphorus: 4 mg/dL (ref 2.5–4.6)

## 2020-03-18 LAB — PROCALCITONIN: Procalcitonin: 0.42 ng/mL

## 2020-03-18 MED ORDER — METRONIDAZOLE IN NACL 5-0.79 MG/ML-% IV SOLN
500.0000 mg | Freq: Three times a day (TID) | INTRAVENOUS | Status: DC
  Administered 2020-03-18 – 2020-03-20 (×7): 500 mg via INTRAVENOUS
  Filled 2020-03-18 (×10): qty 100

## 2020-03-18 MED ORDER — HEPARIN (PORCINE) 25000 UT/250ML-% IV SOLN
1650.0000 [IU]/h | INTRAVENOUS | Status: DC
  Administered 2020-03-18: 09:00:00 1400 [IU]/h via INTRAVENOUS
  Administered 2020-03-19 (×2): 1500 [IU]/h via INTRAVENOUS
  Administered 2020-03-20: 1650 [IU]/h via INTRAVENOUS
  Filled 2020-03-18 (×4): qty 250

## 2020-03-18 MED ORDER — SODIUM CHLORIDE 0.9% FLUSH
10.0000 mL | Freq: Two times a day (BID) | INTRAVENOUS | Status: DC
  Administered 2020-03-18 – 2020-03-19 (×2): 10 mL

## 2020-03-18 MED ORDER — CARVEDILOL 3.125 MG PO TABS
3.1250 mg | ORAL_TABLET | Freq: Two times a day (BID) | ORAL | Status: DC
  Administered 2020-03-19 – 2020-03-20 (×3): 3.125 mg
  Filled 2020-03-18 (×4): qty 1

## 2020-03-18 MED ORDER — FENTANYL BOLUS VIA INFUSION
100.0000 ug | Freq: Once | INTRAVENOUS | Status: DC
  Filled 2020-03-18: qty 100

## 2020-03-18 MED ORDER — SODIUM CHLORIDE 0.9 % IV SOLN
2.0000 g | INTRAVENOUS | Status: DC
  Administered 2020-03-18 – 2020-03-20 (×3): 2 g via INTRAVENOUS
  Filled 2020-03-18 (×3): qty 2
  Filled 2020-03-18: qty 20

## 2020-03-18 MED ORDER — CARVEDILOL 3.125 MG PO TABS
3.1250 mg | ORAL_TABLET | Freq: Two times a day (BID) | ORAL | Status: DC
  Administered 2020-03-18: 3.125 mg via ORAL
  Filled 2020-03-18: qty 1

## 2020-03-18 MED ORDER — HEPARIN BOLUS VIA INFUSION
1000.0000 [IU] | Freq: Once | INTRAVENOUS | Status: AC
  Administered 2020-03-18: 1000 [IU] via INTRAVENOUS
  Filled 2020-03-18: qty 1000

## 2020-03-18 MED ORDER — SODIUM CHLORIDE 0.9% FLUSH: 10.0000 mL | INTRAVENOUS | Status: DC | PRN

## 2020-03-18 NOTE — Progress Notes (Signed)
[facility]  ROUNDING NOTE   Subjective:   hgb 6.9 UOP 1450m.  Creatinine 2.6 (3.16)  Objective:  Vital signs in last 24 hours:  Temp:  [96.8 F (36 C)-99.5 F (37.5 C)] 98.24 F (36.8 C) (01/03 1000) Pulse Rate:  [25-83] 66 (01/03 1000) Resp:  [20-29] 28 (01/03 1000) BP: (115-171)/(59-102) 154/88 (01/03 1000) SpO2:  [90 %-100 %] 96 % (01/03 1000) FiO2 (%):  [28 %] 28 % (01/03 0900)  Weight change:  Filed Weights   03/05/2020 1027  Weight: 106.2 kg    Intake/Output: I/O last 3 completed shifts: In: 6366.4 [I.V.:5471.7; NG/GT:644.7; IV Piggyback:250] Out: 2080 [Urine:2080]   Intake/Output this shift:  Total I/O In: 257.3 [I.V.:137.3; NG/GT:120] Out: 560 [Urine:560]  Physical Exam: General:  Critically ill   Head:  ETT  Eyes:  PERRL  Neck:  trachea midline  Lungs:   OVC  Heart:  S1S2 no rubs  Abdomen:   Soft, nontender, bowel sounds present  Extremities:  1+ peripheral edema.  Neurologic:  Intubated, sedated  Skin:  No lesions  Access:  No hemodialysis access    Basic Metabolic Panel: Recent Labs  Lab 03/15/20 0513 03/16/20 0556 03/16/20 0557 03/17/20 0409 03/18/20 0139 03/18/20 0940  NA 141  --  140 141 142 140  K 4.2  --  3.8 4.4 4.6 4.4  CL 111  --  110 111 113* 114*  CO2 20*  --  22 20* 24 21*  GLUCOSE 94  --  126* 113* 124* 122*  BUN 26*  --  28* 28* 29* 33*  CREATININE 3.15*  --  3.12* 3.16* 2.76* 2.60*  CALCIUM 8.8*  --  8.4* 8.3* 8.5* 8.4*  MG 1.9 1.8  --  1.6* 2.7* 2.5*  PHOS 3.5  --  3.6 4.2 4.5  4.4 4.0  4.2    Liver Function Tests: Recent Labs  Lab 02/25/2020 0700 02/15/2020 1708 03/16/20 0557 03/17/20 0409 03/18/20 0139 03/18/20 0940  AST 28 22  --   --   --   --   ALT 19 23  --   --   --   --   ALKPHOS 72 65  --   --   --   --   BILITOT 1.0 0.6  --   --   --   --   PROT 6.8 5.7*  --   --   --   --   ALBUMIN 3.9 3.3* 2.6* 2.6* 2.6* 2.4*   No results for input(s): LIPASE, AMYLASE in the last 168 hours. No  results for input(s): AMMONIA in the last 168 hours.  CBC: Recent Labs  Lab 03/11/2020 0700 03/14/20 0334 03/15/20 0513 03/16/20 0556 03/17/20 0409 03/18/20 0139 03/18/20 0940  WBC 17.2*   < > 8.5 7.5 8.4 7.3 6.7  NEUTROABS 10.2*  --   --   --   --   --   --   HGB 15.9   < > 12.3* 11.1* 10.8* 11.0* 10.6*  HCT 47.2   < > 37.5* 34.0* 33.6* 34.7* 33.0*  MCV 91.7   < > 95.4 95.0 96.0 96.7 97.3  PLT 254   < > 167 149* 156 163 170   < > = values in this interval not displayed.    Cardiac Enzymes: No results for input(s): CKTOTAL, CKMB, CKMBINDEX, TROPONINI in the last 168 hours.  BNP: Invalid input(s): POCBNP  CBG: Recent Labs  Lab 03/17/20 1205 03/17/20 1618 03/17/20 2004 03/18/20 0221 03/18/20  [place]    Microbiology: Results for orders placed or performed during the hospital encounter of 03/02/2020  Resp Panel by RT-PCR (Flu A&B, Covid) Nasopharyngeal Swab     Status: None   Collection Time: 03/04/2020  7:00 AM   Specimen: Nasopharyngeal Swab; Nasopharyngeal(NP) swabs in vial transport medium  Result Value Ref Range Status   SARS Coronavirus 2 by RT PCR NEGATIVE NEGATIVE Final    Comment: (NOTE) SARS-CoV-2 target nucleic acids are NOT DETECTED.  The SARS-CoV-2 RNA is generally detectable in upper respiratory specimens during the acute phase of infection. The lowest concentration of SARS-CoV-2 viral copies this assay can detect is 138 copies/mL. A negative result does not preclude SARS-Cov-2 infection and should not be used as the sole basis for treatment or other patient management decisions. A negative result may occur with  improper specimen collection/handling, submission of specimen other than nasopharyngeal swab, presence of viral mutation(s) within the areas targeted by this assay, and inadequate number of viral copies(<138 copies/mL). A negative result must be combined with clinical observations, patient history, and  epidemiological information. The expected result is Negative.  Fact Sheet for Patients:  EntrepreneurPulse.com.au  Fact Sheet for Healthcare Providers:  IncredibleEmployment.be  This test is no t yet approved or cleared by the [place] FDA and  has been authorized for detection and/or diagnosis of SARS-CoV-2 by FDA under an Emergency Use Authorization (EUA). This EUA will remain  in effect (meaning this test can be used) for the duration of the COVID-19 declaration under Section 564(b)(1) of the Act, 21 U.S.C.section 360bbb-3(b)(1), unless the authorization is terminated  or revoked sooner.       Influenza A by PCR NEGATIVE NEGATIVE Final   Influenza B by PCR NEGATIVE NEGATIVE Final    Comment: (NOTE) The Xpert Xpress SARS-CoV-2/FLU/RSV plus assay is intended as an aid in the diagnosis of influenza from Nasopharyngeal swab specimens and should not be used as a sole basis for treatment. Nasal washings and aspirates are unacceptable for Xpert Xpress SARS-CoV-2/FLU/RSV testing.  Fact Sheet for Patients: EntrepreneurPulse.com.au  Fact Sheet for Healthcare Providers: IncredibleEmployment.be  This test is not yet approved or cleared by the [place] FDA and has been authorized for detection and/or diagnosis of SARS-CoV-2 by FDA under an Emergency Use Authorization (EUA). This EUA will remain in effect (meaning this test can be used) for the duration of the COVID-19 declaration under Section 564(b)(1) of the Act, 21 U.S.C. section 360bbb-3(b)(1), unless the authorization is terminated or revoked.  Performed at [facility], [address] [place], [place] [address]   Blood Culture (routine x 2)     Status: None   Collection Time: 02/26/2020  7:08 AM   Specimen: BLOOD  Result Value Ref Range Status   Specimen Description BLOOD LEFT [facility]  Final   Special Requests   Final    BOTTLES DRAWN  AEROBIC AND ANAEROBIC Blood Culture adequate volume   Culture   Final    NO GROWTH 5 DAYS Performed at [facility], [address] [place], [place] [address]    Report Status 03/18/2020 FINAL  Final  Blood Culture (routine x 2)     Status: None   Collection Time: 02/22/2020  7:08 AM   Specimen: BLOOD  Result Value Ref Range Status   Specimen Description BLOOD RIGHT HAND  Final   Special Requests   Final    BOTTLES DRAWN AEROBIC AND ANAEROBIC Blood Culture adequate volume  Culture   Final    NO GROWTH 5 DAYS Performed at [facility], [address] [place], [place] [address]    Report Status 03/18/2020 FINAL  Final  Urine culture     Status: None   Collection Time: 02/29/2020  7:10 AM   Specimen: In/Out Cath Urine  Result Value Ref Range Status   Specimen Description   Final    IN/OUT CATH URINE Performed at [facility], [address] [place], [place] [address]    Special Requests   Final    NONE Performed at [facility], [address], [place], [place] [address]    Culture   Final    NO GROWTH Performed at [facility], [place] [address], [place], [place] [address]    Report Status 03/14/2020 FINAL  Final  MRSA PCR Screening     Status: None   Collection Time: 02/23/2020 10:43 AM   Specimen: Nasopharyngeal  Result Value Ref Range Status   MRSA by PCR NEGATIVE NEGATIVE Final    Comment:        The GeneXpert MRSA Assay (FDA approved for NASAL specimens only), is one component of a comprehensive MRSA colonization surveillance program. It is not intended to diagnose MRSA infection nor to guide or monitor treatment for MRSA infections. Performed at [facility], [address] [place], [place] [address]   Culture, respiratory (non-expectorated)     Status: None   Collection Time: 03/15/20  6:53 PM   Specimen: Tracheal Aspirate; Respiratory  Result Value Ref Range Status   Specimen Description   Final     TRACHEAL ASPIRATE Performed at [facility], [address] [place], [place] [address]    Special Requests   Final    NONE Performed at [facility], [address] [place], [place] [address]    Gram Stain   Final    FEW WBC PRESENT, PREDOMINANTLY PMN ABUNDANT GRAM NEGATIVE RODS FEW GRAM POSITIVE COCCI IN PAIRS IN CLUSTERS Performed at [facility], [place] [address], [place], [place] [address]    Culture ABUNDANT STREPTOCOCCUS PNEUMONIAE  Final   Report Status 03/18/2020 FINAL  Final   Organism ID, Bacteria STREPTOCOCCUS PNEUMONIAE  Final      Susceptibility   Streptococcus pneumoniae - MIC*    ERYTHROMYCIN 2 RESISTANT Resistant     LEVOFLOXACIN 1 SENSITIVE Sensitive     VANCOMYCIN 0.5 SENSITIVE Sensitive     PENO - penicillin <=0.06      PENICILLIN (non-meningitis) <=0.06 SENSITIVE Sensitive     PENICILLIN (oral) <=0.06 SENSITIVE Sensitive     CEFTRIAXONE (non-meningitis) <=0.12 SENSITIVE Sensitive     * ABUNDANT STREPTOCOCCUS PNEUMONIAE    Coagulation Studies: No results for input(s): LABPROT, INR in the last 72 hours.  Urinalysis: No results for input(s): COLORURINE, LABSPEC, PHURINE, GLUCOSEU, HGBUR, BILIRUBINUR, KETONESUR, PROTEINUR, UROBILINOGEN, NITRITE, LEUKOCYTESUR in the last 72 hours.  Invalid input(s): APPERANCEUR    Imaging: DG Abd 1 View  Result Date: 03/16/2020 CLINICAL DATA:  OG tube placed EXAM: ABDOMEN - 1 VIEW COMPARISON:  None. FINDINGS: The bowel gas pattern is normal. Tip of the OG tube seen within the stomach. No radio-opaque calculi or other significant radiographic abnormality are seen. IMPRESSION: Tip the NG tube in the stomach. Electronically Signed   By: Prudencio Pair M.D.   On: 03/16/2020 22:39   CT HEAD WO CONTRAST  Result Date: 03/17/2020 CLINICAL DATA:  35 year old male status post CPR. Worsening  mental status. 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 EXAM: CT HEAD WITHOUT CONTRAST TECHNIQUE:  Contiguous axial images were obtained from the base of the skull through the vertex without intravenous contrast. COMPARISON:  Brain MRI 03/15/2020 and earlier. FINDINGS: Brain: Inconspicuous gray-white matter differentiation throughout the brain, although not significantly changed from the head CT 02/25/2020. And bilateral sulci do not appear to have changed. Ventricle size is stable and within normal limits. Increased hypodensity in the left pons corresponding to known infarct. No acute intracranial hemorrhage identified. No new cortically based infarct identified. No intracranial mass effect. Normal basilar cisterns. Vascular: No suspicious intracranial vascular hyperdensity. Skull: No acute osseous abnormality identified. Sinuses/Orbits: Mild sinus opacification in the setting of intubation. Tympanic cavities and mastoids remain clear. Other: No acute orbit or scalp soft tissue finding. Fluid in the pharynx in the setting of intubation. IMPRESSION: 1. No evidence of anoxic injury by CT. Inconspicuous gray-white matter differentiation throughout the brain, but stable from CT 03/06/2020 that predated the MRI (03/15/2020) which was negative for diffuse anoxic injury. 2. 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 No intracranial hemorrhage or mass effect. Electronically Signed   By: Genevie Ann M.D.   On: 03/17/2020 11:19   DG Chest Port 1 View  Result Date: 03/18/2020 CLINICAL DATA:  Acute respiratory failure. EXAM: PORTABLE CHEST 1 VIEW COMPARISON:  03/16/2020 FINDINGS: 0520 hours. Endotracheal tube tip is 7.1 cm above the base of the carina. The NG tube passes into the stomach although the distal tip position is not included on the film. Low lung volumes. Retrocardiac collapse/consolidation is progressive in the interval with probable small left pleural effusion. There is pulmonary vascular congestion without overt pulmonary edema. Telemetry leads overlie the chest. IMPRESSION: 1.  Endotracheal tube tip is 7.1 cm above the base of the carina. 2. Worsening retrocardiac collapse/consolidation with probable small left pleural effusion. Electronically Signed   By: Misty Stanley M.D.   On: 03/18/2020 06:15     Medications:   . amiodarone 30 mg/hr (03/18/20 0800)  . cefTRIAXone (ROCEPHIN)  IV 2 g (03/18/20 1056)  . dexmedetomidine (PRECEDEX) IV infusion 0.6 mcg/kg/hr (03/18/20 0923)  . dextrose 5 % and 0.9% NaCl 75 mL/hr at 03/18/20 0800  . famotidine (PEPCID) IV 20 mg (03/18/20 0915)  . feeding supplement (VITAL HIGH PROTEIN) Stopped (03/17/20 2230)  . fentaNYL infusion INTRAVENOUS 150 mcg/hr (03/18/20 0700)  . heparin 1,400 Units/hr (03/18/20 0924)  . metronidazole 500 mg (03/18/20 0946)   . artificial tears  1 application Both Eyes P1W  . atorvastatin  40 mg Per Tube QHS  . chlorhexidine gluconate (MEDLINE KIT)  15 mL Mouth Rinse BID  . Chlorhexidine Gluconate Cloth  6 each Topical Q0600  . docusate  100 mg Per Tube BID  . feeding supplement (PROSource TF)  45 mL Per Tube Daily  . fentaNYL  100 mcg Intravenous Once  . insulin aspart  0-9 Units Subcutaneous Q4H  . ipratropium-albuterol  3 mL Nebulization Q6H  . mouth rinse  15 mL Mouth Rinse 10 times per day  . metoCLOPramide (REGLAN) injection  5 mg Intravenous Q8H  . midazolam  2 mg Intravenous Once  . multivitamin  15 mL Per Tube Daily  . polyethylene glycol  17 g Per Tube Daily   acetaminophen (TYLENOL) oral liquid 160 mg/5 mL, atropine, dextrose, fentaNYL, fentaNYL, hydrALAZINE, midazolam, ondansetron (ZOFRAN) IV  Assessment/ Plan:   Mr. KEASTON PILE is a 35 y.o.  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   1.  Acute kidney injury on chronic kidney disease stage IIIb baseline creatinine 2.27 with an EGFR of 38 on 02/10/20. History of proteinuria Secondary to ATN from hypotension, cardiac arrest and  renal ischemia. Nonoliguric urine output.  - No indication for dialysis.  - Continue IV fluids  2.  Acute respiratory failure: requiring intubation and mechanical ventilation. Now with aspiration pneumonia on ceftriaxone and metronidazole.   3. Acute exacerbation of systolic congestive heart failure: with atrial fibrillation. Weaned off vasopressors from cardiogenic shock.      LOS: 5 Mica Ramdass 1/3/202211:32 AM

## 2020-03-18 NOTE — Progress Notes (Signed)
ANTICOAGULATION CONSULT NOTE  Pharmacy Consult for heparin Indication: Afib  Patient Measurements: Heparin Dosing Weight: 88 kg  Labs: Recent Labs    03/16/20 0556 03/16/20 0557 03/16/20 2049 03/17/20 0409 03/17/20 1125 03/17/20 1917 03/18/20 0139  HGB 11.1*  --   --  10.8*  --   --  11.0*  HCT 34.0*  --   --  33.6*  --   --  34.7*  PLT 149*  --   --  156  --   --  163  HEPARINUNFRC  --   --    < > 0.32 0.27* 0.34 0.28*  CREATININE  --  3.12*  --  3.16*  --   --  2.76*   < > = values in this interval not displayed.    Estimated Creatinine Clearance: 43 mL/min (A) (by C-G formula based on SCr of 2.76 mg/dL (H)).   Medical History: Past Medical History:  Diagnosis Date  . Aortic root dilatation ([place])    a. 01/2020 Echo: Ao root 4.3cm.  . CKD (chronic kidney disease)   . Depression    after death of family members  . History of chicken pox   . History of echocardiogram    a. 01/2020 Echo: EF 55-60%. No rwma. Mild LVH. Triv AI. Ao root 4.3 cm.  . History of stress test    a. 10/2017 MV: EF 40-45%, diff HK. No scar/ischemia-->intermediate risk in setting of depressed EF however, EF 55-60% by echo-->med managed.  . Hypertension   . PAF (paroxysmal atrial fibrillation) (HCC)    a. CHA2DS2VASc = 1-->eliquis.  . Substance abuse [facility])    h/o cocaine abuse     Assessment: 35 year old male s/p cardiac arrest with ROSC requiring multiple defibrillations and epinephrine. Patient was on his way to an appointment for cardioversion for persistent afib. Patient has PTA Eliquis but due to nature of presentation, post-arrest, difficult to assess last dose of Eliquis PTA, however patient had been here since 12/29 and had not received Eliquis inpatient. Patient was previously on heparin drip for ACS/chest pain. Pharmacy has been consulted for heparin dosing. H&H, platelets currently stable   Goal of Therapy:  Heparin level 0.3-0.7 units/ml (aim low) Monitor platelets by  anticoagulation protocol: Yes   Plan:   Anti-Xa level remains subtherapeutic despite recent rate increase  increase heparin infusion rate to 1500 units/hr  Check HL in 6 hours  CBC daily per protocol  Vallery Sa, PharmD 03/18/2020 7:53 AM

## 2020-03-18 NOTE — Progress Notes (Signed)
ANTICOAGULATION CONSULT NOTE  Pharmacy Consult for heparin Indication: Afib  Patient Measurements: Heparin Dosing Weight: 88 kg  Labs: Recent Labs    03/17/20 0409 03/17/20 1125 03/18/20 0139 03/18/20 0940 03/18/20 2104  HGB 10.8*  --  11.0* 10.6*  --   HCT 33.6*  --  34.7* 33.0*  --   PLT 156  --  163 170  --   HEPARINUNFRC 0.32   < > 0.28* 0.29* 0.45  CREATININE 3.16*  --  2.76* 2.60*  --    < > = values in this interval not displayed.    Estimated Creatinine Clearance: 45.6 mL/min (A) (by C-G formula based on SCr of 2.6 mg/dL (H)).   Medical History: Past Medical History:  Diagnosis Date  . Aortic root dilatation ([place])    a. 01/2020 Echo: Ao root 4.3cm.  . CKD (chronic kidney disease)   . Depression    after death of family members  . History of chicken pox   . History of echocardiogram    a. 01/2020 Echo: EF 55-60%. No rwma. Mild LVH. Triv AI. Ao root 4.3 cm.  . History of stress test    a. 10/2017 MV: EF 40-45%, diff HK. No scar/ischemia-->intermediate risk in setting of depressed EF however, EF 55-60% by echo-->med managed.  . Hypertension   . PAF (paroxysmal atrial fibrillation) (HCC)    a. CHA2DS2VASc = 1-->eliquis.  . Substance abuse [facility])    h/o cocaine abuse     Assessment: 35 year old male s/p cardiac arrest with ROSC requiring multiple defibrillations and epinephrine. Patient was on his way to an appointment for cardioversion for persistent afib. Patient has PTA Eliquis but due to nature of presentation, post-arrest, difficult to assess last dose of Eliquis PTA, however patient had been here since 12/29 and had not received Eliquis inpatient. Patient was previously on heparin drip for ACS/chest pain. Pharmacy has been consulted for heparin dosing. H&H, platelets currently stable   Goal of Therapy:  Heparin level 0.3-0.7 units/ml (aim low) Monitor platelets by anticoagulation protocol: Yes   Plan:  1/3:  HL @ 2104 = 0.45, therapeutic X 1  Will  continue pt on current rate and draw confirmation level on 1/4 @ 0300.   Azelia Reiger D 03/18/2020 9:57 PM

## 2020-03-18 NOTE — Progress Notes (Signed)
Progress Note  Patient Name: Jeremiah Keith Date of Encounter: 03/18/2020  [facility] Cardiologist: [name], MD   Subjective   Continues to be intubated and sedated.  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  Inpatient Medications    Scheduled Meds: . artificial tears  1 application Both Eyes X4G  . atorvastatin  40 mg Per Tube QHS  . chlorhexidine gluconate (MEDLINE KIT)  15 mL Mouth Rinse BID  . Chlorhexidine Gluconate Cloth  6 each Topical Q0600  . docusate  100 mg Per Tube BID  . feeding supplement (PROSource TF)  45 mL Per Tube Daily  . fentaNYL  100 mcg Intravenous Once  . insulin aspart  0-9 Units Subcutaneous Q4H  . ipratropium-albuterol  3 mL Nebulization Q6H  . mouth rinse  15 mL Mouth Rinse 10 times per day  . metoCLOPramide (REGLAN) injection  5 mg Intravenous Q8H  . midazolam  2 mg Intravenous Once  . multivitamin  15 mL Per Tube Daily  . polyethylene glycol  17 g Per Tube Daily   Continuous Infusions: . amiodarone 30 mg/hr (03/18/20 1300)  . cefTRIAXone (ROCEPHIN)  IV 2 g (03/18/20 1056)  . dexmedetomidine (PRECEDEX) IV infusion 0.6 mcg/kg/hr (03/18/20 0923)  . dextrose 5 % and 0.9% NaCl 75 mL/hr at 03/18/20 0800  . famotidine (PEPCID) IV 20 mg (03/18/20 0915)  . feeding supplement (VITAL HIGH PROTEIN) Stopped (03/17/20 2230)  . fentaNYL infusion INTRAVENOUS 150 mcg/hr (03/18/20 1259)  . heparin 1,500 Units/hr (03/18/20 1321)  . metronidazole 500 mg (03/18/20 0946)   PRN Meds: acetaminophen (TYLENOL) oral liquid 160 mg/5 mL, atropine, dextrose, fentaNYL, fentaNYL, hydrALAZINE, midazolam, ondansetron (ZOFRAN) IV   Vital Signs    Vitals:   03/18/20 1000 03/18/20 1100 03/18/20 1200 03/18/20 1300  BP: (!) 154/88 (!) 158/90 (!) 162/91 (!) 163/91  Pulse: 66 66 68 66  Resp: (!) 28 (!) 28 (!) 28 (!) 28  Temp: 98.24 F (36.8 C) 97.88 F (36.6 C) 98.24 F (36.8 C) 98.42 F (36.9 C)   TempSrc:   Esophageal   SpO2: 96% 95% 96% 96%  Weight:      Height:        Intake/Output Summary (Last 24 hours) at 03/18/2020 1359 Last data filed at 03/18/2020 0800 Gross per 24 hour  Intake 3157.01 ml  Output 1440 ml  Net 1717.01 ml   Last 3 Weights 02/21/2020 03/11/2020 02/16/2020  Weight (lbs) 234 lb 2.1 oz 222 lb 225 lb  Weight (kg) 106.2 kg 100.699 kg 102.059 kg      Telemetry    Normal sinus rhythm- Personally Reviewed  ECG     - Personally Reviewed  Physical Exam   GEN:  Intubated sedated Neck:  Unable to estimate JVD Cardiac: RRR, no murmurs, rubs, or gallops.  Respiratory:  Bronchial breath sounds, coarse GI: Soft, non-distended  MS: No edema; No deformity. Neuro:   Unable to participate  Psych: Sedated  Labs    High Sensitivity Troponin:   Recent Labs  Lab 03/03/2020 0700 03/03/2020 0903 03/10/2020 1237 03/10/2020 1708 02/14/2020 2043  TROPONINIHS 22* 47* 106* 126* 136*      Chemistry Recent Labs  Lab 02/21/2020 0700 03/15/2020 1237 03/07/2020 1708 03/02/2020 2043 03/17/20 0409 03/18/20 0139 03/18/20 0940  NA 137   < > 141   < > 141 142 140  K 3.5   < > 4.1   < > 4.4 4.6 4.4  CL 103   < > 109   < > 111 113* 114*  CO2 21*   < > 21*   < > 20* 24 21*  GLUCOSE 263*   < > 217*   < > 113* 124* 122*  BUN 29*   < > 33*   < > 28* 29* 33*  CREATININE 2.96*   < > 2.92*   < > 3.16* 2.76* 2.60*  CALCIUM 8.8*   < > 7.9*   < > 8.3* 8.5* 8.4*  PROT 6.8  --  5.7*  --   --   --   --   ALBUMIN 3.9  --  3.3*   < > 2.6* 2.6* 2.4*  AST 28  --  22  --   --   --   --   ALT 19  --  23  --   --   --   --   ALKPHOS 72  --  65  --   --   --   --   BILITOT 1.0  --  0.6  --   --   --   --   GFRNONAA 27*   < > 28*   < > 25* 30* 32*  ANIONGAP 13   < > 11   < > 10 5 5    < > = values in this interval not displayed.     Hematology Recent Labs  Lab 03/17/20 0409 03/18/20 0139 03/18/20 0940  WBC 8.4 7.3 6.7  RBC 3.50* 3.59* 3.39*  HGB 10.8* 11.0* 10.6*  HCT 33.6* 34.7* 33.0*   MCV 96.0 96.7 97.3  MCH 30.9 30.6 31.3  MCHC 32.1 31.7 32.1  RDW 13.6 13.5 13.4  PLT 156 163 170    BNP Recent Labs  Lab 03/01/2020 0700  BNP 229.5*     DDimer No results for input(s): DDIMER in the last 168 hours.   Radiology    DG Abd 1 View  Result Date: 03/16/2020 CLINICAL DATA:  OG tube placed EXAM: ABDOMEN - 1 VIEW COMPARISON:  None. FINDINGS: The bowel gas pattern is normal. Tip of the OG tube seen within the stomach. No radio-opaque calculi or other significant radiographic abnormality are seen. IMPRESSION: Tip the NG tube in the stomach. Electronically Signed   By: Prudencio Pair M.D.   On: 03/16/2020 22:39   CT HEAD WO CONTRAST  Result Date: 03/17/2020 CLINICAL DATA:  35 year old male status post CPR. Worsening mental status. 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 EXAM: CT HEAD WITHOUT CONTRAST TECHNIQUE: Contiguous axial images were obtained from the base of the skull through the vertex without intravenous contrast. COMPARISON:  Brain MRI 03/15/2020 and earlier. FINDINGS: Brain: Inconspicuous gray-white matter differentiation throughout the brain, although not significantly changed from the head CT 03/09/2020. And bilateral sulci do not appear to have changed. Ventricle size is stable and within normal limits. Increased hypodensity in the left pons corresponding to known infarct. No acute intracranial hemorrhage identified. No new cortically based infarct identified. No intracranial mass effect. Normal basilar cisterns. Vascular: No suspicious intracranial vascular hyperdensity. Skull: No acute osseous abnormality identified. Sinuses/Orbits: Mild sinus opacification in the setting of intubation. Tympanic cavities and mastoids remain clear. Other: No acute orbit or scalp soft tissue finding. Fluid in the pharynx in the setting of intubation. IMPRESSION: 1. No evidence of anoxic injury by CT. Inconspicuous gray-white matter differentiation throughout the brain,  but stable from CT 03/15/2020 that predated the MRI (  03/15/2020) which was negative for diffuse anoxic injury. 2. 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 No intracranial hemorrhage or mass effect. Electronically Signed   By: Genevie Ann M.D.   On: 03/17/2020 11:19   DG Chest Port 1 View  Result Date: 03/18/2020 CLINICAL DATA:  Acute respiratory failure. EXAM: PORTABLE CHEST 1 VIEW COMPARISON:  03/16/2020 FINDINGS: 0520 hours. Endotracheal tube tip is 7.1 cm above the base of the carina. The NG tube passes into the stomach although the distal tip position is not included on the film. Low lung volumes. Retrocardiac collapse/consolidation is progressive in the interval with probable small left pleural effusion. There is pulmonary vascular congestion without overt pulmonary edema. Telemetry leads overlie the chest. IMPRESSION: 1. Endotracheal tube tip is 7.1 cm above the base of the carina. 2. Worsening retrocardiac collapse/consolidation with probable small left pleural effusion. Electronically Signed   By: Misty Stanley M.D.   On: 03/18/2020 06:15    Cardiac Studies   Echo 1. Left ventricular ejection fraction, by estimation, is 20 to 25%. The  left ventricle has severely decreased function. The left ventricle  demonstrates global hypokinesis. There is mild left ventricular  hypertrophy. Left ventricular diastolic parameters  are consistent with Grade I diastolic dysfunction (impaired relaxation).  2. Right ventricular systolic function is severely reduced. The right  ventricular size is normal.  3. The mitral valve is normal in structure. No evidence of mitral valve  regurgitation.  4. The aortic valve is normal in structure. Aortic valve regurgitation is  trivial.  5. Aortic dilatation noted. There is mild dilatation of the aortic root,  measuring 42 mm.   Patient Profile     35 y.o. male with history of persistent Afib, CKD stage IIIb, HTN, and tobacco use who  presented with VF arrest.   Assessment & Plan    VF arrest  Driving to the hospital was unresponsive, presenting to fire department , requiring defibrillation x2  Intubated for airway protection   nonsustained VT on arrival, was started on amiodarone infusion ,  Has completed rewarming CT scan brain and MRI confirming pontine stroke    Cardiomyopathy  Long history of poorly controlled hypertension, medication noncompliance Global hypokinesis with acute drop in EF to 20-25% Currently off sedation, confused likely exacerbated by stroke and rest Labile blood pressure  Given renal dysfunction not a good candidate for ARB, ACE inhibitor,entersto We will go ahead and start small dose carvedilol given underlying cardiomyopathy. If blood pressure continues to be elevated, consider a combination of long-acting nitroglycerin and hydralazine. The patient ultimately would benefit from a right and left cardiac catheterization.  However, he is currently not a good candidate given recent stroke as well as acute on chronic kidney disease.  Persistent atrial fibrillation  Maintaining normal sinus rhythm after cardioversion x2 by fire department  We will continue amiodarone He is currently on heparin drip.  This at some point can be transitioned back to Eliquis.   Acute on chronic renal failure stage IIIb Renal dysfunction, suspect ATN  Renal function is stable and followed by nephrology.     For questions or updates, please contact [place] Please consult [email or web address] for contact info under        Signed, Kathlyn Sacramento, MD  03/18/2020, 1:59 PM

## 2020-03-18 NOTE — Plan of Care (Signed)
Discussed in front of patient plan of care for the evening, pain management, mouth care and feeding after emesis with no evidence of learning at this time.  Patient is on a ventilator and mild sedation at this time.  Problem: Education: Goal: Knowledge of General Education information will improve Description: Including pain rating scale, medication(s)/side effects and non-pharmacologic comfort measures Outcome: Progressing   Problem: Health Behavior/Discharge Planning: Goal: Ability to manage health-related needs will improve Outcome: Progressing   Problem: Nutrition: Goal: Adequate nutrition will be maintained Outcome: Progressing

## 2020-03-18 NOTE — Progress Notes (Signed)
ANTICOAGULATION CONSULT NOTE  Pharmacy Consult for heparin Indication: Afib  Patient Measurements: Heparin Dosing Weight: 88 kg  Labs: Recent Labs    03/15/20 0513 03/16/20 0556 03/16/20 0557 03/16/20 2049 03/17/20 0409 03/17/20 1125 03/17/20 1917 03/18/20 0139  HGB 12.3* 11.1*  --   --  10.8*  --   --  11.0*  HCT 37.5* 34.0*  --   --  33.6*  --   --  34.7*  PLT 167 149*  --   --  156  --   --  163  APTT 98*  --   --   --   --   --   --   --   HEPARINUNFRC 0.68  --   --    < > 0.32 0.27* 0.34 0.28*  CREATININE 3.15*  --  3.12*  --  3.16*  --   --  2.76*   < > = values in this interval not displayed.    Estimated Creatinine Clearance: 43 mL/min (A) (by C-G formula based on SCr of 2.76 mg/dL (H)).   Medical History: Past Medical History:  Diagnosis Date  . Aortic root dilatation ([place])    a. 01/2020 Echo: Ao root 4.3cm.  . CKD (chronic kidney disease)   . Depression    after death of family members  . History of chicken pox   . History of echocardiogram    a. 01/2020 Echo: EF 55-60%. No rwma. Mild LVH. Triv AI. Ao root 4.3 cm.  . History of stress test    a. 10/2017 MV: EF 40-45%, diff HK. No scar/ischemia-->intermediate risk in setting of depressed EF however, EF 55-60% by echo-->med managed.  . Hypertension   . PAF (paroxysmal atrial fibrillation) (HCC)    a. CHA2DS2VASc = 1-->eliquis.  . Substance abuse [facility])    h/o cocaine abuse     Assessment: 35 year old male s/p cardiac arrest with ROSC requiring multiple defibrillations and epinephrine. Patient was on his way to an appointment for cardioversion for persistent afib. Patient has PTA Eliquis but due to nature of presentation, post-arrest, difficult to assess last dose of Eliquis PTA, however patient has been here since 12/29 and has not received Eliquis inpatient. Patient was previously on heparin drip for ACS/chest pain. Both aPTT and HL were therapeutic x4. Most recent levels 12/31 HL 0.68, aPTT 98. Previous  heparin drip was stopped 12/31 around 1525. Pharmacy has been consulted for heparin dosing and monitoring for Afib.   1/1 2049 HL 0.52 1/2 0409 HL 0.32 1/2 1125 HL 0.27 - bolus 1000 units, rate increase to 1250 units/hr 1/2 1917 HL 0.34, therapeutic x 1 1/3 0139 HL 0.28, subtherapeutic   Goal of Therapy:  Heparin level 0.3-0.7 units/ml (aim low) Monitor platelets by anticoagulation protocol: Yes   Plan:  --Bolus 1000 units and increase heparin infusion rate to 1400 units/hr. --Check HL in 6 hours --CBC daily per protocol  Renda Rolls, PharmD, [facility] 03/18/2020 3:04 AM

## 2020-03-18 NOTE — Progress Notes (Signed)
[place] paged to be present at meeting w/pt.'s father and CCM and pt.'s wife (via phone).  At family meeting in [place], [place] shared new that pt. is in multiorgan failure and has suffered strokes; family grateful for candor from CCM --> tearful and overwhelmed by hearing of pt.'s poor prognosis.  No decisions made at this time;  fr. went back to pt.'s rm. 'to just tell him I love him before I go.'  No need expressed at this time; [facility] remains available.

## 2020-03-18 NOTE — Progress Notes (Signed)
NAME:  Jeremiah Keith, MRN:  [id number], DOB:  1985-06-07, LOS: 5 ADMISSION DATE:  02/21/2020, CONSULTATION DATE:  02/21/2020 REFERRING MD:  Dr. Corky Downs, CHIEF COMPLAINT:  Cardiac Arrest   Brief History:  35 y.o. Male with a PMH significant for Paroxsymal Atrial fibrillation, Hypertension, and CKD admitted s/p out of hospital cardiac arrest (V-fib).  Being placed on Targeted Temperature Management @ 36 C. 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  Patient profile:  Jeremiah Keith is a 35 year old male with a past medical history significant for paroxysmal atrial fibrillation on Eliquis for 4 weeks, chronic kidney disease, and hypertension who presented to [facility] ED on 03/03/2020 following an out of hospital V. fib cardiac arrest.  Apparently he was scheduled to have a cardioversion done today and in route to the appointment his father noted he became unresponsive in the car where he subsequently pulled into an EMS station.  CPR was started, and he received defibrillation x3 for reported V. fib arrest with ROSC obtained.  Estimated time of collapse to CPR is 4 minutes, with ROSC obtained after 6 minutes 43 seconds.  ED course: Upon arrival to the ED he was emergently intubated.  He remained unresponsive therefore he was placed on targeted temperature management at 36 C.  Cardiology was consulted and recommended placing on amiodarone drip.  Initial work-up shows bicarb 21, BUN 29, creatinine 2.96, glucose 263, BNP 229.5, high-sensitivity troponin 22, lactic acid 5.7, WBC 17.2 with neutrophilia, and procalcitonin is negative.  Chest x-ray is negative for any acute cardiopulmonary process.  His SARS-CoV-2 PCR and influenza PCR both negative.  Urinalysis is not consistent with UTI.  Urine drug screen is positive for cannabinoid.  CT head obtained which showed mild hypodensity in the frontal white matter (right greater than left) which is  nonspecific in appearance, along with possible chronic ischemia.  Post admission to ICU he is now hypotensive requiring vasopressors.  Echocardiogram performed revealing Cardiomyopathy and biventricular failure with EF of 20 to 25%.  Central venous access and arterial line placed.  Patient underwent targeted hypothermia protocol.  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  Reviewed hospital course and findings - overall very poor prognosis - recommed comfort measures due to advanced CHF and multiple CVA  Consults:  PCCM  Cardiology Nephrology Neurology  Procedures:  12/29: Endotracheal intubation 12/29: Left femoral CVC placed 12/29: Left femoral A-line placed>> out 01/02  Significant Diagnostic Tests:  12/29: Chest x-ray>>Portable AP supine view at 0718 hours. Intubated. Endotracheal tube tip in good position between the level the clavicles and carina. Enteric tube courses to the abdomen, tip not included. Partially visible gas in the stomach. Resuscitation pads project over the lower chest. Somewhat low lung volumes. Mediastinal contours remain within normal limits. Allowing for portable technique the lungs are clear. No osseous abnormality identified. 12/29: Abdominal X-ray>>Enteric tube terminates in the stomach, side hole at the gastric fundus. Moderate gastric distension. 12/29:  CT Head w/o Contrast>>Mild hypodensity  in the frontal white matter right greater than left, not present in 2017. Nonspecific appearance. Possible chronic ischemia however given the patient's age consider MRI of the brain without and with contrast for further evaluation 12/29: 2D Echocardiogram>>1. Left ventricular ejection fraction, by estimation, is 20 to 25%. The  left ventricle has severely decreased function. The left ventricle  demonstrates global hypokinesis. There is mild left ventricular  hypertrophy. Left ventricular diastolic parameters  are consistent with Grade I diastolic dysfunction (impaired relaxation).  2. Right ventricular systolic function is severely reduced. The right  ventricular size is normal.  3. The mitral valve is normal in structure. No evidence of mitral valve  regurgitation.  4. The aortic valve is normal in structure. Aortic valve regurgitation is  trivial.  5. Aortic dilatation noted. There is mild dilatation of the aortic root,  measuring 42 mm.  12/29: EEG>>This study is suggestive of profoud diffuse encephalopathy, nonspecific etiology but likely related to sedation, anoxic/hypoxic brain injury. No seizures or epileptiform discharges were seen throughout the recording. 12/31: MRI Brain>>1. 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 Trace cytotoxic edema at the lacunar infarct site, but no pontine vasogenic edema or mass effect to suggest any of these micro-bleeds are acute. 2. There is also a punctate cortical infarct at the superior right motor strip, without hemorrhage or mass effect. 3. Numerous chronic micro-hemorrhages throughout the lower brainstem. Lesser involvement of the bilateral cerebellum, right thalamus. Chronic ischemia in the right frontal lobe white matter corresponding to the recent CT finding. 12/31: EEG>>This study issuggestive ofsevere diffuse encephalopathy, nonspecific etiology but likely related to sedation,  anoxic/hypoxic brain injury.No seizures or epileptiform discharges were seen throughout the recording. EEG appears to be improving compared to previous study on 02/25/2020 03/17/2020: No significant change on CT head  03/16/2020: Chest x-ray   Micro Data:  12/29: SARS-CoV-2 PCR>>negative 12/29: Influenza PCR>>negative 12/29: Blood culture x2>> negative 12/29: Urine>> negative 12/29: Tracheal aspirate>> GPC's, pending  Antimicrobials:  N/A  Interim History / Subjective:  Some agitation overnight requiring extra sedation TTM completed 12/30 Sedated with Fentanyl and Propofol, wake-up assessment shows persistent encephalopathy Off pressors  Objective   Blood pressure (!) 179/99, pulse 69, temperature 98.6 F (37 C), resp. rate (!) 23, height 5' 7"  (1.702 m), weight 106.2 kg, SpO2 94 %.    Vent Mode: PRVC FiO2 (%):  [28 %-40 %] 40 % Set Rate:  [20 bmp-28 bmp] 28 bmp Vt Set:  [500 mL] 500 mL PEEP:  [5 cmH20] 5 cmH20 Plateau Pressure:  [17 cmH20-18 cmH20] 18 cmH20   Intake/Output Summary (Last 24 hours) at 03/18/2020 1755 Last data filed at 03/18/2020 1600 Gross per 24 hour  Intake 4111.74 ml  Output 1930 ml  Net 2181.74 ml   Filed Weights   02/15/2020 1027  Weight: 106.2 kg    Examination: General: Critically ill appearing male, laying in bed, intubated and sedated, in NAD, on wake-up assessment appears encephalopathic HENT: Atraumatic, normocephalic, neck supple, no JVD, ETT in place Lungs: Clear breath sounds bilaterally, no wheezing or rales, synchronous with the vent, even Cardiovascular: Bradycardia, regular rhythm, s1s2, no M/R/G, 2+ distal pulses Abdomen: Soft, nontender, nondistended, no guarding or rebound tenderness, BS+ x4 Extremities: Normal bulk and tone, no deformities, no edema Neuro: Heavily sedated, pupils PERRL Skin: Warm and dry.  No obvious rashes, lesions, or ulcerations  Resolved Hospital Problem list   N/A  Assessment & Plan:  V. Fib Cardiac  arrest Cardiogenic Shock Cardiomyopathy, Biventricular failure (EF 25% on Echo 03/04/2020) Paroxsymal Atrial Fibrillation Hx: HTN -Continuous cardiac monitoring -Maintain MAP >65 -Vasopressors as needed to maintain MAP goal>>currently weaned off -Targeted Temperature Management @ 36 C completed 12/30 -Cardiology following, appreciate input -Heparin gtt started at low-dose -Diuresis as warranted -Echo on 03/12/2020: LVEF 20 to 25% -On amiodarone for ventricular arrhythmia/A. fib -Hydralazine/nitrates for hypertension   Acute Hypoxic Respiratory Failure in the setting of Cardiac Arrest Suspected Aspiration Pneumonitis -Full vent support, vent settings established and reviewed -Wean FiO2 & PEEP as tolerated to maintain O2 sats >92% -Follow intermittent CXR and ABG as needed -VAP Bundle implemented -SAT/SBT as tolerated   Leukocytosis -resolved Aspiration pneumonitis ruled out -Monitor fever curve -Panculture as needed for fever -CXR without evidence of pneumonia 01/02  AKI superimposed on CKD Mild Hyperkalemia>>resolved -Monitor I&O's / urinary output -Follow BMP -Ensure adequate renal perfusion -Avoid nephrotoxic agents as able -Replace electrolytes as indicated -Nephrology consulted, appreciate input -BUN 28/creatinine 3.16  Unresponsive s/p cardiac arrest 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 CT edema MRI Brain w/ Acute Infarct of the left central Pons Numerous pontine micro-hemorrhages (chronic) -Targeted Temperature Management @ 36 C completed 12/30 -Maintain RASS 0 to -1 -Propofol, Fentanyl as needed -Daily wake up assessments  -Neurology consulting, appreciate input  Hyperglycemia -CBG's -Sliding scale insulin -Follow ICU Hypo/hyperglycemia protocol  -Check Hgb A1c   Best practice (evaluated daily)  Diet: NPO, Tube feeds, dietician following Pain/Anxiety/Delirium protocol (if indicated): Fentanyl and Propfol VAP protocol (if indicated): Yes, HOB 30  degrees DVT prophylaxis: SCDs/low-dose heparin infusion GI prophylaxis: Pepcid IV Glucose control: SSI Mobility: Bedrest Disposition:ICU  Goals of Care:  Last date of multidisciplinary goals of care discussion: 03/15/20 Family and staff present: Pt's father udpated at bedside 03/17/2020  Summary of discussion: Slow recovery likely, will likely need rehab and a cardiac work-up in future Follow up goals of care discussion due: 03/16/2020, done continue full CODE STATUS Code Status: Full Code  Labs   CBC: Recent Labs  Lab 03/10/2020 0700 03/14/20 0334 03/15/20 0513 03/16/20 0556 03/17/20 0409 03/18/20 0139 03/18/20 0940  WBC 17.2*   < > 8.5 7.5 8.4 7.3 6.7  NEUTROABS 10.2*  --   --   --   --   --   --   HGB 15.9   < > 12.3* 11.1* 10.8* 11.0* 10.6*  HCT 47.2   < > 37.5* 34.0* 33.6* 34.7* 33.0*  MCV 91.7   < > 95.4 95.0 96.0 96.7 97.3  PLT 254   < > 167 149* 156 163 170   < > = values in this interval not displayed.    Basic Metabolic Panel: Recent Labs  Lab 03/15/20 0513 03/16/20 0556 03/16/20 0557 03/17/20 0409 03/18/20 0139 03/18/20 0940  NA 141  --  140 141 142 140  K 4.2  --  3.8 4.4 4.6 4.4  CL 111  --  110 111 113* 114*  CO2 20*  --  22 20* 24 21*  GLUCOSE 94  --  126* 113* 124* 122*  BUN 26*  --  28* 28* 29* 33*  CREATININE 3.15*  --  3.12* 3.16* 2.76* 2.60*  CALCIUM 8.8*  --  8.4* 8.3* 8.5* 8.4*  MG 1.9 1.8  --  1.6* 2.7* 2.5*  PHOS 3.5  --  3.6 4.2 4.5  4.4 4.0  4.2   GFR: Estimated Creatinine Clearance: 45.6 mL/min (A) (by C-G formula based on SCr of 2.6 mg/dL (  H)). Recent Labs  Lab 03/05/2020 0700 03/10/2020 0903 02/16/2020 1237 03/14/20 0334 03/15/20 0513 03/16/20 0556 03/17/20 0409 03/18/20 0139 03/18/20 0940  PROCALCITON  --   --  <0.10 0.38 0.38  --   --  0.42  --   WBC 17.2*  --   --  13.3* 8.5 7.5 8.4 7.3 6.7  LATICACIDVEN 5.7* 1.5  --   --   --   --   --   --   --     Liver Function Tests: Recent Labs  Lab 03/10/2020 0700 02/25/2020 1708  03/16/20 0557 03/17/20 0409 03/18/20 0139 03/18/20 0940  AST 28 22  --   --   --   --   ALT 19 23  --   --   --   --   ALKPHOS 72 65  --   --   --   --   BILITOT 1.0 0.6  --   --   --   --   PROT 6.8 5.7*  --   --   --   --   ALBUMIN 3.9 3.3* 2.6* 2.6* 2.6* 2.4*   No results for input(s): LIPASE, AMYLASE in the last 168 hours. No results for input(s): AMMONIA in the last 168 hours.  ABG    Component Value Date/Time   PHART 7.35 03/18/2020 0800   PCO2ART 37 03/18/2020 0800   PO2ART 79 (L) 03/18/2020 0800   HCO3 20.4 03/18/2020 0800   ACIDBASEDEF 4.7 (H) 03/18/2020 0800   O2SAT 94.9 03/18/2020 0800     Coagulation Profile: Recent Labs  Lab 03/02/2020 0700 02/14/2020 1115  INR 1.2 1.2    Cardiac Enzymes: No results for input(s): CKTOTAL, CKMB, CKMBINDEX, TROPONINI in the last 168 hours.  HbA1C: Hgb A1c MFr Bld  Date/Time Value Ref Range Status  02/20/2020 12:37 PM 5.0 4.8 - 5.6 % Final    Comment:    (NOTE) Pre diabetes:          5.7%-6.4%  Diabetes:              >6.4%  Glycemic control for   <7.0% adults with diabetes     CBG: Recent Labs  Lab 03/17/20 2004 03/18/20 0221 03/18/20 0628 03/18/20 1324 03/18/20 1614  GLUCAP 113* 111* 117* 117* 109*    Review of Systems:   Unable to assess due to Critical illness, intubation, and sedation   Allergies Allergies  Allergen Reactions  . Nsaids     Chronic kidney disease  . Penicillins Rash    Has patient had a PCN reaction causing immediate rash, facial/tongue/throat swelling, SOB or lightheadedness with hypotension: Yes Has patient had a PCN reaction causing severe rash involving mucus membranes or skin necrosis: No Has patient had a PCN reaction that required hospitalization: No Has patient had a PCN reaction occurring within the last 10 years: No If all of the above answers are "NO", then may proceed with Cephalosporin use.     Scheduled Meds: . artificial tears  1 application Both Eyes N4O  .  atorvastatin  40 mg Per Tube QHS  . [START ON 03/19/2020] carvedilol  3.125 mg Per Tube BID WC  . chlorhexidine gluconate (MEDLINE KIT)  15 mL Mouth Rinse BID  . Chlorhexidine Gluconate Cloth  6 each Topical Q0600  . docusate  100 mg Per Tube BID  . feeding supplement (PROSource TF)  45 mL Per Tube Daily  . fentaNYL  100 mcg Intravenous Once  . insulin aspart  0-9 Units Subcutaneous Q4H  . ipratropium-albuterol  3 mL Nebulization Q6H  . mouth rinse  15 mL Mouth Rinse 10 times per day  . metoCLOPramide (REGLAN) injection  5 mg Intravenous Q8H  . midazolam  2 mg Intravenous Once  . multivitamin  15 mL Per Tube Daily  . polyethylene glycol  17 g Per Tube Daily   Continuous Infusions: . amiodarone 30 mg/hr (03/18/20 1600)  . cefTRIAXone (ROCEPHIN)  IV Stopped (03/18/20 1126)  . dexmedetomidine (PRECEDEX) IV infusion 0.6 mcg/kg/hr (03/18/20 1600)  . dextrose 5 % and 0.9% NaCl 75 mL/hr at 03/18/20 1600  . famotidine (PEPCID) IV Stopped (03/18/20 0944)  . feeding supplement (VITAL HIGH PROTEIN) 1,000 mL (03/18/20 1646)  . fentaNYL infusion INTRAVENOUS 150 mcg/hr (03/18/20 1600)  . heparin 1,500 Units/hr (03/18/20 1600)  . metronidazole 500 mg (03/18/20 1631)   PRN Meds:.acetaminophen (TYLENOL) oral liquid 160 mg/5 mL, atropine, dextrose, fentaNYL, fentaNYL, hydrALAZINE, midazolam, ondansetron (ZOFRAN) IV      Critical care time: 109 minutes    Critical care provider statement:    Critical care time (minutes):  109   Critical care time was exclusive of:  Separately billable procedures and  treating other patients   Critical care was necessary to treat or prevent imminent or  life-threatening deterioration of the following conditions:  cardiac arrest , multiple comorbid conditions   Critical care was time spent personally by me on the following  activities:  Development of treatment plan with patient or surrogate,  discussions with consultants, evaluation of patient's response to   treatment, examination of patient, obtaining history from patient or  surrogate, ordering and performing treatments and interventions, ordering  and review of laboratory studies and re-evaluation of patient's condition   I assumed direction of critical care for this patient from another  provider in my specialty: no     Ottie Glazier, M.D.  Pulmonary [facility] [place]     *This note was dictated using voice recognition software/Dragon.  Despite best efforts to proofread, errors can occur which can change the meaning.  Any change was purely unintentional.

## 2020-03-18 NOTE — Progress Notes (Signed)
Neurology Progress Note  Patient ID: Jeremiah Keith is a 35 y.o. with PMHx of  has a past medical history of Aortic root dilatation ([place]), CKD (chronic kidney disease), Depression, History of chicken pox, History of echocardiogram, History of stress test, Hypertension, PAF (paroxysmal atrial fibrillation) ([place]), and Substance abuse ([place]). (cocaine, remote)  Initially consulted for:  Pontine stroke  Subjective: Minimally interactive Febrile overnight to 100.9 (38.3)  Exam: Vitals:   03/18/20 0645 03/18/20 0700  BP: (!) 157/83 (!) 141/81  Pulse: 77 70  Resp: (!) 29 (!) 28  Temp: 98.78 F (37.1 C) 99.32 F (37.4 C)  SpO2: 91% 95%   Gen: In bed, comfortable. With sedation paused x 30 min exam as below Resp: non-labored breathing, no grossly audible wheezing, ETT in place  Cardiac: Perfusing extremities well  Abd: soft, nt  Neuro: At best, opens eyes to voice. Spontaneous eye movements better in vertical gaze than side to side, highly saccadic eye movements. Pupils equal and reactive to light but left is slightly irregular (3 -> 1 mm). Eyes move to the left slightly better than the right. Corneals, cough, gag intact.  Sensory motor: Trace movement in bilateral LE to command. No movement of the bilateral UE, opens eyes to noxious stimulus of bilateral UE  Pertinent Data:  Improving renal function (Cr 3.16 on 03/17/20 -> 2.6 on 03/18/20)  Lab Results  Component Value Date   CHOL 147 02/10/2020   HDL 31 (L) 02/10/2020   LDLCALC 99 02/10/2020   TRIG 66 03/16/2020   CHOLHDL 4.7 02/10/2020   Lab Results  Component Value Date   HGBA1C 5.0 03/15/2020    Lab Results  Component Value Date   TSH 2.870 02/09/2020   Echocardiogram with reduced left ventricular ejection fraction (20 to 25%), severely reduced right ventricular function, normal left atrial size, normal right atrial size,   I have personally reviewed the images obtained: Head CT with likely chronic microvascular  changes given his uncontrolled hypertension MRI brain with left pontine stroke as well described by radiology, punctate lesion in the right motor strip, and numerous microhemorrhages predominantly in the posterior fossa and brainstem again consistent with uncontrolled hypertension; no cortical hemorrhages.  T2 hyperintensities consistent with microvascular disease though the differential can be broad for these changes. MRA brain personally reviewed, unremarkable   12/29 EEG suggestive of profound diffuse encephalopathy without seizures or epileptiform discharges 12/30 EEG (still on propofol and fentanyl) notable for severe diffuse encephalopathy, improving compared to prior without seizure or epileptiform discharges  Impression: This is a 35 year old gentleman with past medical history significant for poorly controlled hypertension complicated by chronic kidney disease, acute decompensated heart failure with reduced ejection fraction, paroxysmal atrial fibrillation now in sinus rhythm, V. 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  Etiology is likely cardioembolic in the setting of his risk factors, though a contribution of small vessel disease cannot be entirely ruled out.  Exam is improving today.   Of note, emesis could potentially be related to stroke. Continue to monitor for improvement, consider imipramine if refractory (caution for interaction with fentanyl)  Recommendations: # Pontine stroke, likely cardioembolic  - Stroke labs: Recent TSH and A1c are normal as above, LDL above goal at 99 - Frequent neuro checks q4hr  - Carotid dopplers still pending - Heparin drip, low goal no bolus managed by pharmacy for [facility] given severely reduced EF and high risk of further cardioembolic events. May transition to oral AC after 03/17/2020 from  Neuro perspective if HCT remains stable at that time - No neurological indication for ASA while on [facility], but should be started if [facility] is stopped  -  Atorvastatin 40 mg nightly  - Risk factor modification (medication compliance, smoking cessation, diet, exercise) - Telemetry monitoring; 30 day event monitor on discharge if no arrythmias captured  - Blood pressure goal              - Normotension - PT consult, OT consult, Speech consult, when patient stabilized - Neurology to follow  Lesleigh Noe MD-PhD [facility] [phone]   35 minutes of critical care time spent in discussion with family, discussion with critical care medicine team and nursing

## 2020-03-19 ENCOUNTER — Ambulatory Visit: Payer: Medicaid Other | Admitting: Cardiology

## 2020-03-19 ENCOUNTER — Inpatient Hospital Stay: Payer: Medicaid Other

## 2020-03-19 ENCOUNTER — Telehealth: Payer: Self-pay | Admitting: Cardiology

## 2020-03-19 DIAGNOSIS — I5021 Acute systolic (congestive) heart failure: Secondary | ICD-10-CM

## 2020-03-19 DIAGNOSIS — R570 Cardiogenic shock: Secondary | ICD-10-CM

## 2020-03-19 DIAGNOSIS — I469 Cardiac arrest, cause unspecified: Secondary | ICD-10-CM | POA: Diagnosis not present

## 2020-03-19 DIAGNOSIS — I4819 Other persistent atrial fibrillation: Secondary | ICD-10-CM | POA: Diagnosis not present

## 2020-03-19 LAB — RENAL FUNCTION PANEL
Albumin: 2.3 g/dL — ABNORMAL LOW (ref 3.5–5.0)
Anion gap: 6 (ref 5–15)
BUN: 33 mg/dL — ABNORMAL HIGH (ref 6–20)
CO2: 21 mmol/L — ABNORMAL LOW (ref 22–32)
Calcium: 8.3 mg/dL — ABNORMAL LOW (ref 8.9–10.3)
Chloride: 113 mmol/L — ABNORMAL HIGH (ref 98–111)
Creatinine, Ser: 2.36 mg/dL — ABNORMAL HIGH (ref 0.61–1.24)
GFR, Estimated: 36 mL/min — ABNORMAL LOW (ref >60)
Glucose, Bld: 130 mg/dL — ABNORMAL HIGH (ref 70–99)
Phosphorus: 3.8 mg/dL (ref 2.5–4.6)
Potassium: 4.1 mmol/L (ref 3.5–5.1)
Sodium: 140 mmol/L (ref 135–145)

## 2020-03-19 LAB — PROCALCITONIN: Procalcitonin: 0.36 ng/mL

## 2020-03-19 LAB — BLOOD GAS, ARTERIAL
Acid-base deficit: 4.6 mmol/L — ABNORMAL HIGH (ref 0.0–2.0)
Allens test (pass/fail): POSITIVE — AB
Bicarbonate: 22.1 mmol/L (ref 20.0–28.0)
FIO2: 1
MECHVT: 500 mL
O2 Saturation: 87.8 %
PEEP: 10 cmH2O
Patient temperature: 37
RATE: 18 resp/min
pCO2 arterial: 46 mmHg (ref 32.0–48.0)
pH, Arterial: 7.29 — ABNORMAL LOW (ref 7.350–7.450)
pO2, Arterial: 61 mmHg — ABNORMAL LOW (ref 83.0–108.0)

## 2020-03-19 LAB — MAGNESIUM: Magnesium: 2.3 mg/dL (ref 1.7–2.4)

## 2020-03-19 LAB — CBC
HCT: 32.1 % — ABNORMAL LOW (ref 39.0–52.0)
Hemoglobin: 10.4 g/dL — ABNORMAL LOW (ref 13.0–17.0)
MCH: 30.9 pg (ref 26.0–34.0)
MCHC: 32.4 g/dL (ref 30.0–36.0)
MCV: 95.3 fL (ref 80.0–100.0)
Platelets: 171 10*3/uL (ref 150–400)
RBC: 3.37 MIL/uL — ABNORMAL LOW (ref 4.22–5.81)
RDW: 13.1 % (ref 11.5–15.5)
WBC: 6 10*3/uL (ref 4.0–10.5)
nRBC: 0 % (ref 0.0–0.2)

## 2020-03-19 LAB — GLUCOSE, CAPILLARY
Glucose-Capillary: 100 mg/dL — ABNORMAL HIGH (ref 70–99)
Glucose-Capillary: 102 mg/dL — ABNORMAL HIGH (ref 70–99)
Glucose-Capillary: 104 mg/dL — ABNORMAL HIGH (ref 70–99)
Glucose-Capillary: 125 mg/dL — ABNORMAL HIGH (ref 70–99)
Glucose-Capillary: 87 mg/dL (ref 70–99)
Glucose-Capillary: 92 mg/dL (ref 70–99)

## 2020-03-19 LAB — HEPARIN LEVEL (UNFRACTIONATED): Heparin Unfractionated: 0.61 IU/mL (ref 0.30–0.70)

## 2020-03-19 MED ORDER — PROSOURCE TF PO LIQD
45.0000 mL | Freq: Every day | ORAL | Status: DC
  Administered 2020-03-20: 45 mL
  Filled 2020-03-19: qty 45

## 2020-03-19 MED ORDER — ROCURONIUM BROMIDE 50 MG/5ML IV SOLN: 50.0000 mg | Freq: Once | INTRAVENOUS | Status: AC

## 2020-03-19 MED ORDER — METOPROLOL TARTRATE 5 MG/5ML IV SOLN
5.0000 mg | INTRAVENOUS | Status: AC
  Administered 2020-03-19: 5 mg via INTRAVENOUS

## 2020-03-19 MED ORDER — AMIODARONE HCL 200 MG PO TABS: 400.0000 mg | ORAL_TABLET | Freq: Every day | ORAL | Status: DC

## 2020-03-19 MED ORDER — STERILE WATER FOR INJECTION IJ SOLN
INTRAMUSCULAR | Status: AC
  Filled 2020-03-19: qty 10

## 2020-03-19 MED ORDER — MIDAZOLAM HCL 2 MG/2ML IJ SOLN
INTRAMUSCULAR | Status: AC
  Filled 2020-03-19: qty 2

## 2020-03-19 MED ORDER — HALOPERIDOL LACTATE 5 MG/ML IJ SOLN
INTRAMUSCULAR | Status: AC
  Filled 2020-03-19: qty 1

## 2020-03-19 MED ORDER — MIDAZOLAM 50MG/50ML (1MG/ML) PREMIX INFUSION
0.5000 mg/h | INTRAVENOUS | Status: DC
  Administered 2020-03-19: 5 mg/h via INTRAVENOUS
  Administered 2020-03-19 – 2020-03-20 (×3): 7 mg/h via INTRAVENOUS
  Administered 2020-03-20: 6 mg/h via INTRAVENOUS
  Filled 2020-03-19 (×5): qty 50

## 2020-03-19 MED ORDER — ISOSORBIDE DINITRATE 10 MG PO TABS
5.0000 mg | ORAL_TABLET | Freq: Three times a day (TID) | ORAL | Status: DC
  Administered 2020-03-19 – 2020-03-20 (×3): 5 mg
  Filled 2020-03-19 (×7): qty 0.5

## 2020-03-19 MED ORDER — HYDRALAZINE HCL 10 MG PO TABS
10.0000 mg | ORAL_TABLET | Freq: Three times a day (TID) | ORAL | Status: DC
  Administered 2020-03-19 – 2020-03-20 (×3): 10 mg
  Filled 2020-03-19 (×7): qty 1

## 2020-03-19 MED ORDER — ORAL CARE MOUTH RINSE
15.0000 mL | Freq: Two times a day (BID) | OROMUCOSAL | Status: DC
  Administered 2020-03-19: 15 mL via OROMUCOSAL

## 2020-03-19 MED ORDER — ORAL CARE MOUTH RINSE
15.0000 mL | OROMUCOSAL | Status: DC
  Administered 2020-03-19 – 2020-03-20 (×9): 15 mL via OROMUCOSAL

## 2020-03-19 MED ORDER — LORAZEPAM 2 MG/ML IJ SOLN: 2.0000 mg | Freq: Once | INTRAMUSCULAR | Status: AC

## 2020-03-19 MED ORDER — MIDAZOLAM HCL 2 MG/2ML IJ SOLN: 4.0000 mg | Freq: Once | INTRAMUSCULAR | Status: DC

## 2020-03-19 MED ORDER — CHLORHEXIDINE GLUCONATE 0.12 % MT SOLN: 15.0000 mL | Freq: Two times a day (BID) | OROMUCOSAL | Status: DC

## 2020-03-19 MED ORDER — NICARDIPINE HCL IN NACL 20-0.86 MG/200ML-% IV SOLN
3.0000 mg/h | INTRAVENOUS | Status: DC
  Filled 2020-03-19: qty 200

## 2020-03-19 MED ORDER — FUROSEMIDE 10 MG/ML IJ SOLN
80.0000 mg | Freq: Once | INTRAMUSCULAR | Status: AC
  Administered 2020-03-19: 80 mg via INTRAVENOUS
  Filled 2020-03-19: qty 8

## 2020-03-19 MED ORDER — METOPROLOL TARTRATE 5 MG/5ML IV SOLN
INTRAVENOUS | Status: AC
  Administered 2020-03-19: 5 mg via INTRAVENOUS
  Filled 2020-03-19: qty 5

## 2020-03-19 MED ORDER — ROCURONIUM BROMIDE 50 MG/5ML IV SOLN
INTRAVENOUS | Status: AC
  Administered 2020-03-19: 50 mg via INTRAVENOUS
  Filled 2020-03-19: qty 1

## 2020-03-19 MED ORDER — LORAZEPAM 2 MG/ML IJ SOLN
INTRAMUSCULAR | Status: AC
  Administered 2020-03-19: 2 mg via INTRAVENOUS
  Filled 2020-03-19: qty 1

## 2020-03-19 MED ORDER — VITAL HIGH PROTEIN PO LIQD
1000.0000 mL | ORAL | Status: DC
  Administered 2020-03-19: 1000 mL

## 2020-03-19 MED ORDER — MIDAZOLAM HCL 2 MG/2ML IJ SOLN
2.0000 mg | Freq: Once | INTRAMUSCULAR | Status: AC
  Administered 2020-03-19: 2 mg via INTRAVENOUS

## 2020-03-19 MED ORDER — AMIODARONE HCL 200 MG PO TABS
400.0000 mg | ORAL_TABLET | Freq: Every day | ORAL | Status: DC
  Administered 2020-03-20: 400 mg
  Filled 2020-03-19: qty 2

## 2020-03-19 MED ORDER — CHLORHEXIDINE GLUCONATE 0.12% ORAL RINSE (MEDLINE KIT)
15.0000 mL | Freq: Two times a day (BID) | OROMUCOSAL | Status: DC
  Administered 2020-03-19 – 2020-03-20 (×2): 15 mL via OROMUCOSAL

## 2020-03-19 NOTE — Telephone Encounter (Signed)
Patient currently admitted   Appt cancelled for now

## 2020-03-19 NOTE — Progress Notes (Signed)
NAME:  Jeremiah Keith, MRN:  [id number], DOB:  [birth year], LOS: 6 ADMISSION DATE:  03/11/2020, CONSULTATION DATE:  02/25/2020 REFERRING MD:  Dr. Corky Downs, CHIEF COMPLAINT:  Cardiac Arrest   Brief History:  35 y.o. Male with a PMH significant for Paroxsymal Atrial fibrillation, Hypertension, and CKD admitted s/p out of hospital cardiac arrest (V-fib).  Being placed on Targeted Temperature Management @ 36 C. 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  Patient profile:  Jeremiah Keith is a 35 year old male with a past medical history significant for paroxysmal atrial fibrillation on Eliquis for 4 weeks, chronic kidney disease, and hypertension who presented to [facility] ED on 03/08/2020 following an out of hospital V. fib cardiac arrest.  Apparently he was scheduled to have a cardioversion done today and in route to the appointment his father noted he became unresponsive in the car where he subsequently pulled into an EMS station.  CPR was started, and he received defibrillation x3 for reported V. fib arrest with ROSC obtained.  Estimated time of collapse to CPR is 4 minutes, with ROSC obtained after 6 minutes 43 seconds.  ED course: Upon arrival to the ED he was emergently intubated.  He remained unresponsive therefore he was placed on targeted temperature management at 36 C.  Cardiology was consulted and recommended placing on amiodarone drip.  Initial work-up shows bicarb 21, BUN 29, creatinine 2.96, glucose 263, BNP 229.5, high-sensitivity troponin 22, lactic acid 5.7, WBC 17.2 with neutrophilia, and procalcitonin is negative.  Chest x-ray is negative for any acute cardiopulmonary process.  His SARS-CoV-2 PCR and influenza PCR both negative.  Urinalysis is not consistent with UTI.  Urine drug screen is positive for cannabinoid.  CT head obtained which showed mild hypodensity in the frontal white matter (right greater than left) which is  nonspecific in appearance, along with possible chronic ischemia.  Post admission to ICU he is now hypotensive requiring vasopressors.  Echocardiogram performed revealing Cardiomyopathy and biventricular failure with EF of 20 to 25%.  Central venous access and arterial line placed.  Patient underwent targeted hypothermia protocol.  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  Reviewed hospital course and findings - overall very poor prognosis - recommed comfort measures due to advanced CHF and multiple CVA 03/20/19- patient was showing some signs of clinical improvement (mouthing words and following verbal communication, he did have weakness in LUE and LLE but did pass SBT today, I met with mother of patient and discussed high risk extubation due to Advanced CHF with CVA.  Mother understood that patient is with poor prognosis but insists that he can breath and wished for extubation trial.  Patient was extubated and was placed on BIPAP however was unable to breathe due to severe hypoxemia and tachypnea and was re-intubated.  Patient with very poor prognosis, I recommend comfort care measures at this time. Palliative care consultation in process.   Consults:  PCCM  Cardiology Nephrology Neurology  Procedures:  12/29: Endotracheal intubation 12/29: Left femoral CVC placed 12/29: Left femoral A-line placed>> out 01/02  Significant Diagnostic  Tests:  12/29: Chest x-ray>>Portable AP supine view at 0718 hours. Intubated. Endotracheal tube tip in good position between the level the clavicles and carina. Enteric tube courses to the abdomen, tip not included. Partially visible gas in the stomach. Resuscitation pads project over the lower chest. Somewhat low lung volumes. Mediastinal contours remain within normal limits. Allowing for portable technique the lungs are clear. No osseous abnormality identified. 12/29: Abdominal X-ray>>Enteric tube terminates in the stomach, side hole at the gastric fundus. Moderate gastric distension. 12/29: CT Head w/o Contrast>>Mild hypodensity in the frontal white matter right greater than left, not present in 2017. Nonspecific appearance. Possible chronic ischemia however given the patient's age consider MRI of the brain without and with contrast for further evaluation 12/29: 2D Echocardiogram>>1. Left ventricular ejection fraction, by estimation, is 20 to 25%. The  left ventricle has severely decreased function. The left ventricle  demonstrates global hypokinesis. There is mild left ventricular  hypertrophy. Left ventricular diastolic parameters  are consistent with Grade I diastolic dysfunction (impaired relaxation).  2. Right ventricular systolic function is severely reduced. The right  ventricular size is normal.  3. The mitral valve is normal in structure. No evidence of mitral valve  regurgitation.  4. The aortic valve is normal in structure. Aortic valve regurgitation is  trivial.  5. Aortic dilatation noted. There is mild dilatation of the aortic root,  measuring 42 mm.  12/29: EEG>>This study is suggestive of profoud diffuse encephalopathy, nonspecific etiology but likely related to sedation, anoxic/hypoxic brain injury. No seizures or epileptiform discharges were seen throughout the recording. 12/31: MRI Brain>>1. 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 Trace cytotoxic edema at the lacunar infarct site, but no pontine vasogenic edema or mass effect to suggest any of these micro-bleeds are acute. 2. There is also a punctate cortical infarct at the superior right motor strip, without hemorrhage or mass effect. 3. Numerous chronic micro-hemorrhages throughout the lower brainstem. Lesser involvement of the bilateral cerebellum, right thalamus. Chronic ischemia in the right frontal lobe white matter corresponding to the recent CT finding. 12/31: EEG>>This study issuggestive ofsevere diffuse encephalopathy, nonspecific etiology but likely related to sedation, anoxic/hypoxic brain injury.No seizures or epileptiform discharges were seen throughout the recording. EEG appears to be improving compared to previous study on 02/20/2020 03/17/2020: No significant change on CT head  03/16/2020: Chest x-ray   Micro Data:  12/29: SARS-CoV-2 PCR>>negative 12/29: Influenza PCR>>negative 12/29: Blood culture x2>> negative 12/29: Urine>> negative 12/29: Tracheal aspirate>> GPC's, pending  Antimicrobials:  N/A  Interim History / Subjective:  Some agitation overnight requiring extra sedation TTM completed 12/30 Sedated with Fentanyl and Propofol, wake-up assessment shows persistent encephalopathy Off pressors  Objective   Blood pressure (!) 209/98, pulse 66, temperature 98.42 F (36.9 C), resp. rate (!) 21, height 5' 7" (1.702 m), weight 106.2 kg, SpO2 97 %.    Vent Mode: Spontaneous FiO2 (%):  [28 %-40 %] 40 % Set Rate:  [28 bmp] 28 bmp Vt Set:  [500 mL] 500 mL PEEP:  [5 cmH20] 5 cmH20 Pressure Support:  [5 cmH20-10 cmH20] 5 cmH20 Plateau Pressure:  [17 cmH20-18 cmH20] 17 cmH20   Intake/Output Summary (Last 24 hours) at 03/19/2020 1320 Last data filed at 03/19/2020 1130 Gross per 24 hour  Intake 4054 ml  Output 1995 ml  Net 2059 ml   [employer]  02/17/2020 1027  Weight: 106.2 kg     Examination: General: Critically ill appearing male, laying in bed, intubated and sedated, in NAD, on wake-up assessment appears encephalopathic HENT: Atraumatic, normocephalic, neck supple, no JVD, ETT in place Lungs: Clear breath sounds bilaterally, no wheezing or rales, synchronous with the vent, even Cardiovascular: Bradycardia, regular rhythm, s1s2, no M/R/G, 2+ distal pulses Abdomen: Soft, nontender, nondistended, no guarding or rebound tenderness, BS+ x4 Extremities: Normal bulk and tone, no deformities, no edema Neuro: Heavily sedated, pupils PERRL Skin: Warm and dry.  No obvious rashes, lesions, or ulcerations  Resolved Hospital Problem list   N/A  Assessment & Plan:   V. Fib Cardiac arrest Cardiogenic Shock Cardiomyopathy, Biventricular failure (EF 25% on Echo 03/12/2020) Paroxsymal Atrial Fibrillation Hx: HTN -Continuous cardiac monitoring -Maintain MAP >65 -Vasopressors as needed to maintain MAP goal>>currently weaned off -Targeted Temperature Management @ 36 C completed 12/30 -Cardiology following, appreciate input -Heparin gtt started at low-dose -Diuresis as warranted -Echo on 02/21/2020: LVEF 20 to 25% -On amiodarone for ventricular arrhythmia/A. fib -Hydralazine/nitrates for hypertension   Acute Hypoxic Respiratory Failure in the setting of Cardiac Arrest Suspected Aspiration Pneumonitis -Full vent support, vent settings established and reviewed -Wean FiO2 & PEEP as tolerated to maintain O2 sats >92% -Follow intermittent CXR and ABG as needed -VAP Bundle implemented -SAT/SBT as tolerated   Leukocytosis -resolved Aspiration pneumonitis ruled out -Monitor fever curve -Panculture as needed for fever -CXR without evidence of pneumonia 01/02  AKI superimposed on CKD Mild Hyperkalemia>>resolved -Monitor I&O's / urinary output -Follow BMP -Ensure adequate renal perfusion -Avoid nephrotoxic agents as able -Replace electrolytes as indicated -Nephrology  consulted, appreciate input -BUN 28/creatinine 3.16  Unresponsive s/p cardiac arrest 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 CT edema MRI Brain w/ Acute Infarct of the left central Pons Numerous pontine micro-hemorrhages (chronic) -Targeted Temperature Management @ 36 C completed 12/30 -Maintain RASS 0 to -1 -Propofol, Fentanyl as needed -Daily wake up assessments  -Neurology consulting, appreciate input  Hyperglycemia -CBG's -Sliding scale insulin -Follow ICU Hypo/hyperglycemia protocol  -Check Hgb A1c   Best practice (evaluated daily)  Diet: NPO, Tube feeds, dietician following Pain/Anxiety/Delirium protocol (if indicated): Fentanyl and Propfol VAP protocol (if indicated): Yes, HOB 30 degrees DVT prophylaxis: SCDs/low-dose heparin infusion GI prophylaxis: Pepcid IV Glucose control: SSI Mobility: Bedrest Disposition:ICU  Goals of Care:  Last date of multidisciplinary goals of care discussion: 03/15/20 Family and staff present: Pt's father udpated at bedside 03/17/2020  Summary of discussion: Slow recovery likely, will likely need rehab and a cardiac work-up in future Follow up goals of care discussion due: 03/16/2020, done continue full CODE STATUS Code Status: Full Code  Labs   CBC: Recent Labs  Lab 02/27/2020 0700 03/14/20 0334 03/16/20 0556 03/17/20 0409 03/18/20 0139 03/18/20 0940 03/19/20 0401  WBC 17.2*   < > 7.5 8.4 7.3 6.7 6.0  NEUTROABS 10.2*  --   --   --   --   --   --   HGB 15.9   < > 11.1* 10.8* 11.0* 10.6* 10.4*  HCT 47.2   < > 34.0* 33.6* 34.7* 33.0* 32.1*  MCV 91.7   < > 95.0 96.0 96.7 97.3 95.3  PLT 254   < > 149* 156 163 170 171   < > = values in this interval not displayed.    Basic Metabolic Panel: Recent Labs  Lab 03/16/20 0556 03/16/20 0557 03/17/20 0409 03/18/20 0139 03/18/20 0940 03/19/20 0401  NA  --  140 141 142 140 140  K  --  3.8 4.4 4.6 4.4 4.1  CL  --  110 111 113* 114* 113*  CO2  --  22 20* 24 21* 21*  GLUCOSE  --  126*  113* 124* 122* 130*  BUN  --  28* 28* 29* 33* 33*  CREATININE  --  3.12* 3.16* 2.76* 2.60* 2.36*  CALCIUM  --  8.4* 8.3* 8.5* 8.4* 8.3*  MG 1.8  --  1.6* 2.7* 2.5* 2.3  PHOS  --  3.6 4.2 4.5  4.4 4.0  4.2 3.8   GFR: Estimated Creatinine Clearance: 50.2 mL/min (A) (by C-G formula based on SCr of 2.36 mg/dL (H)). Recent Labs  Lab 02/28/2020 0700 02/19/2020 0903 02/26/2020 1237 03/14/20 0334 03/15/20 0513 03/16/20 0556 03/17/20 0409 03/18/20 0139 03/18/20 0940 03/19/20 0401  PROCALCITON  --   --    < > 0.38 0.38  --   --  0.42  --  0.36  WBC 17.2*  --   --  13.3* 8.5   < > 8.4 7.3 6.7 6.0  LATICACIDVEN 5.7* 1.5  --   --   --   --   --   --   --   --    < > = values in this interval not displayed.    Liver Function Tests: Recent Labs  Lab 02/21/2020 0700 03/12/2020 1708 03/16/20 0557 03/17/20 0409 03/18/20 0139 03/18/20 0940 03/19/20 0401  AST 28 22  --   --   --   --   --   ALT 19 23  --   --   --   --   --   ALKPHOS 72 65  --   --   --   --   --   BILITOT 1.0 0.6  --   --   --   --   --   PROT 6.8 5.7*  --   --   --   --   --   ALBUMIN 3.9 3.3* 2.6* 2.6* 2.6* 2.4* 2.3*   No results for input(s): LIPASE, AMYLASE in the last 168 hours. No results for input(s): AMMONIA in the last 168 hours.  ABG    Component Value Date/Time   PHART 7.35 03/18/2020 0800   PCO2ART 37 03/18/2020 0800   PO2ART 79 (L) 03/18/2020 0800   HCO3 20.4 03/18/2020 0800   ACIDBASEDEF 4.7 (H) 03/18/2020 0800   O2SAT 94.9 03/18/2020 0800     Coagulation Profile: Recent Labs  Lab 03/03/2020 0700 02/22/2020 1115  INR 1.2 1.2    Cardiac Enzymes: No results for input(s): CKTOTAL, CKMB, CKMBINDEX, TROPONINI in the last 168 hours.  HbA1C: Hgb A1c MFr Bld  Date/Time Value Ref Range Status  02/22/2020 12:37 PM 5.0 4.8 - 5.6 % Final    Comment:    (NOTE) Pre diabetes:          5.7%-6.4%  Diabetes:              >6.4%  Glycemic control for   <7.0% adults with diabetes     CBG: Recent Labs   Lab 03/18/20 2012 03/18/20 2326 03/19/20 0306 03/19/20 0732 03/19/20 1129  GLUCAP 104* 111* 100* 102* 125*    Review of Systems:   Unable to assess due to Critical illness, intubation, and sedation   Allergies Allergies  Allergen Reactions  . Nsaids     Chronic kidney disease  . Penicillins Rash    Has patient had a PCN reaction causing immediate rash, facial/tongue/throat  swelling, SOB or lightheadedness with hypotension: Yes Has patient had a PCN reaction causing severe rash involving mucus membranes or skin necrosis: No Has patient had a PCN reaction that required hospitalization: No Has patient had a PCN reaction occurring within the last 10 years: No If all of the above answers are "NO", then may proceed with Cephalosporin use.     Scheduled Meds: . amiodarone  400 mg Oral Daily  . atorvastatin  40 mg Per Tube QHS  . carvedilol  3.125 mg Per Tube BID WC  . chlorhexidine  15 mL Mouth Rinse BID  . Chlorhexidine Gluconate Cloth  6 each Topical Q0600  . docusate  100 mg Per Tube BID  . fentaNYL  100 mcg Intravenous Once  . hydrALAZINE  10 mg Per Tube TID  . insulin aspart  0-9 Units Subcutaneous Q4H  . ipratropium-albuterol  3 mL Nebulization Q6H  . isosorbide dinitrate  5 mg Per Tube TID  . mouth rinse  15 mL Mouth Rinse q12n4p  . midazolam      . midazolam  2 mg Intravenous Once  . midazolam  4 mg Intravenous Once  . polyethylene glycol  17 g Per Tube Daily  . rocuronium      . rocuronium  50 mg Intravenous Once  . sterile water (preservative free)       Continuous Infusions: . cefTRIAXone (ROCEPHIN)  IV 200 mL/hr at 03/19/20 0800  . dexmedetomidine (PRECEDEX) IV infusion 0.8 mcg/kg/hr (03/19/20 0929)  . dextrose 5 % and 0.9% NaCl 75 mL/hr at 03/19/20 0800  . famotidine (PEPCID) IV 20 mg (03/19/20 1114)  . fentaNYL infusion INTRAVENOUS 175 mcg/hr (03/19/20 0800)  . heparin 1,500 Units/hr (03/19/20 0800)  . metronidazole 500 mg (03/19/20 0923)  . midazolam      PRN Meds:.acetaminophen (TYLENOL) oral liquid 160 mg/5 mL, atropine, dextrose, fentaNYL, fentaNYL, hydrALAZINE, midazolam, ondansetron (ZOFRAN) IV, sodium chloride flush      Critical care time: 33 minutes    Critical care provider statement:    Critical care time (minutes):  33   Critical care time was exclusive of:  Separately billable procedures and  treating other patients   Critical care was necessary to treat or prevent imminent or  life-threatening deterioration of the following conditions:  cardiac arrest , multiple comorbid conditions   Critical care was time spent personally by me on the following  activities:  Development of treatment plan with patient or surrogate,  discussions with consultants, evaluation of patient's response to  treatment, examination of patient, obtaining history from patient or  surrogate, ordering and performing treatments and interventions, ordering  and review of laboratory studies and re-evaluation of patient's condition   I assumed direction of critical care for this patient from another  provider in my specialty: no     Ottie Glazier, M.D.  [facility] [place]     *This note was dictated using voice recognition software/Dragon.  Despite best efforts to proofread, errors can occur which can change the meaning.  Any change was purely unintentional.

## 2020-03-19 NOTE — Progress Notes (Signed)
ANTICOAGULATION CONSULT NOTE  Pharmacy Consult for heparin Indication: Afib  Patient Measurements: Heparin Dosing Weight: 88 kg  Labs: Recent Labs    03/18/20 0139 03/18/20 0940 03/18/20 2104 03/19/20 0401  HGB 11.0* 10.6*  --  10.4*  HCT 34.7* 33.0*  --  32.1*  PLT 163 170  --  171  HEPARINUNFRC 0.28* 0.29* 0.45 0.61  CREATININE 2.76* 2.60*  --  2.36*    Estimated Creatinine Clearance: 50.2 mL/min (A) (by C-G formula based on SCr of 2.36 mg/dL (H)).   Medical History: Past Medical History:  Diagnosis Date  . Aortic root dilatation ([place])    a. 01/2020 Echo: Ao root 4.3cm.  . CKD (chronic kidney disease)   . Depression    after death of family members  . History of chicken pox   . History of echocardiogram    a. 01/2020 Echo: EF 55-60%. No rwma. Mild LVH. Triv AI. Ao root 4.3 cm.  . History of stress test    a. 10/2017 MV: EF 40-45%, diff HK. No scar/ischemia-->intermediate risk in setting of depressed EF however, EF 55-60% by echo-->med managed.  . Hypertension   . PAF (paroxysmal atrial fibrillation) (HCC)    a. CHA2DS2VASc = 1-->eliquis.  . Substance abuse [facility])    h/o cocaine abuse   Assessment: 35 year old male s/p cardiac arrest with ROSC requiring multiple defibrillations and epinephrine. Patient was on his way to an appointment for cardioversion for persistent afib. Patient has PTA Eliquis but due to nature of presentation, post-arrest, difficult to assess last dose of Eliquis PTA, however patient had been here since 12/29 and had not received Eliquis inpatient. Patient was previously on heparin drip for ACS/chest pain. Pharmacy has been consulted for heparin dosing. H&H, platelets currently stable  Goal of Therapy:  Heparin level 0.3-0.7 units/ml (aim low) Monitor platelets by anticoagulation protocol: Yes   Plan:  1/4: 0401 HL 0.61, therapeutic x 2  Will continue pt on current rate and recheck HL and CBC tomorrow am  Ena Dawley 03/19/2020 4:32 AM

## 2020-03-19 NOTE — Progress Notes (Signed)
[place] visited pt. while rounding on ICU as follow-up from visit w/CCM and pt.'s fr. yesterday.  Pt. intubated and appeared to be resting peacefully, w/mother at bedside.  Mthr. shared pt. had been opening his eyes and recognized her as 'mom' earlier this AM, but he has since become less responsive and seems to have reverted to his prior condition.  Mthr. shared she has become foster parent to numerous children since her second husband died approx. 10 yrs ago; she has two daughters, pt. is her only son.  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  Acc. to Mthr. pt.'s life revolved around his three year old son; she shared he had been incredibly excited to give his son Christmas presents.   [facility] provided extended supportive presence and active listening.  Mthr. and family would benefit from continued support and [place] will try to check in tomorrow.

## 2020-03-19 NOTE — Procedures (Signed)
Endotracheal Intubation: Patient required placement of an artificial airway secondary to Respiratory Failure  Consent: Emergent.   Hand washing performed prior to starting the procedure.   Medications administered for sedation prior to procedure:  Midazolam 4 mg IV,  Vecuronium 10 mg IV, Fentanyl 100 mcg IV.    A time out procedure was called and correct patient, name, & ID confirmed. Needed supplies and equipment were assembled and checked to include ETT, 10 ml syringe, Glidescope, Mac and Miller blades, suction, oxygen and bag mask valve, end tidal CO2 monitor.   Patient was positioned to align the mouth and pharynx to facilitate visualization of the glottis.   Heart rate, SpO2 and blood pressure was continuously monitored during the procedure. Pre-oxygenation was conducted prior to intubation and endotracheal tube was placed through the vocal cords into the trachea.     The artificial airway was placed under direct visualization via glidescope route using a 7.5 ETT on the first attempt.  ETT was secured at 24 cm mark.  Placement was confirmed by auscuitation of lungs with good breath sounds bilaterally and no stomach sounds.  Condensation was noted on endotracheal tube.   Pulse ox 98%.  CO2 detector in place with appropriate color change.   Complications: None .    Chest radiograph ordered and pending.   Comments: OGT placed via glidescope.   Ottie Glazier, M.D.  [facility]

## 2020-03-19 NOTE — Progress Notes (Addendum)
Progress Note  Patient Name: Jeremiah Keith Date of Encounter: 03/19/2020  [facility] Cardiologist: [name], MD   Subjective   Patient remains intubated and sedated.  Mother is at the bedside and notes that patient has opened his eyes spontaneously and appeared to mouth mom this morning.  Inpatient Medications    Scheduled Meds: . artificial tears  1 application Both Eyes R1V  . atorvastatin  40 mg Per Tube QHS  . carvedilol  3.125 mg Per Tube BID WC  . chlorhexidine gluconate (MEDLINE KIT)  15 mL Mouth Rinse BID  . Chlorhexidine Gluconate Cloth  6 each Topical Q0600  . docusate  100 mg Per Tube BID  . feeding supplement (PROSource TF)  45 mL Per Tube Daily  . fentaNYL  100 mcg Intravenous Once  . insulin aspart  0-9 Units Subcutaneous Q4H  . ipratropium-albuterol  3 mL Nebulization Q6H  . mouth rinse  15 mL Mouth Rinse 10 times per day  . midazolam  2 mg Intravenous Once  . multivitamin  15 mL Per Tube Daily  . polyethylene glycol  17 g Per Tube Daily  . sodium chloride flush  10-40 mL Intracatheter Q12H   Continuous Infusions: . amiodarone 30 mg/hr (03/19/20 0800)  . cefTRIAXone (ROCEPHIN)  IV 200 mL/hr at 03/19/20 0800  . dexmedetomidine (PRECEDEX) IV infusion 0.8 mcg/kg/hr (03/19/20 0929)  . dextrose 5 % and 0.9% NaCl 75 mL/hr at 03/19/20 0800  . famotidine (PEPCID) IV Stopped (03/18/20 2242)  . feeding supplement (VITAL HIGH PROTEIN) 20 mL/hr at 03/19/20 0400  . fentaNYL infusion INTRAVENOUS 175 mcg/hr (03/19/20 0800)  . heparin 1,500 Units/hr (03/19/20 0800)  . metronidazole 500 mg (03/19/20 0923)   PRN Meds: acetaminophen (TYLENOL) oral liquid 160 mg/5 mL, atropine, dextrose, fentaNYL, fentaNYL, hydrALAZINE, midazolam, ondansetron (ZOFRAN) IV, sodium chloride flush   Vital Signs    Vitals:   03/19/20 0800 03/19/20 0806 03/19/20 0900 03/19/20 1004  BP: (!) 163/94 (!) 163/94 (!) 178/97 (!) 209/98  Pulse: 60 61 66   Resp: (!) 28  (!) 21    Temp: 98.24 F (36.8 C)  98.42 F (36.9 C)   TempSrc: Esophageal     SpO2: 98%  97%   Weight:      Height:        Intake/Output Summary (Last 24 hours) at 03/19/2020 1050 Last data filed at 03/19/2020 0800 Gross per 24 hour  Intake 4921.38 ml  Output 2195 ml  Net 2726.38 ml   Last 3 Weights 02/20/2020 03/11/2020 02/16/2020  Weight (lbs) 234 lb 2.1 oz 222 lb 225 lb  Weight (kg) 106.2 kg 100.699 kg 102.059 kg      Telemetry    Sinus rhythm with PVCs - Personally Reviewed  ECG    No new tracing.  Physical Exam   GEN:  Intubated and sedated.  Occasionally opens eyes spontaneously but does not track. Neck:  Unable to assess JVP due to body habitus and support devices. Cardiac: RRR, no murmurs, rubs, or gallops.  Respiratory:  Clear anteriorly. GI: Soft, nontender, non-distended  MS: No edema; No deformity.  Extremities warm to touch. Neuro:  Intubated and sedated. Psych: Unable to assess as pt is intubated and sedated  Labs    High Sensitivity Troponin:   Recent Labs  Lab 03/04/2020 0700 02/28/2020 0903 03/12/2020 1237 03/08/2020 1708 02/20/2020 2043  TROPONINIHS 22* 47* 106* 126* 136*      Chemistry Recent Labs  Lab 02/26/2020 0700 03/01/2020 1237 03/09/2020  1708 03/09/2020 2043 03/18/20 0139 03/18/20 0940 03/19/20 0401  NA 137   < > 141   < > 142 140 140  K 3.5   < > 4.1   < > 4.6 4.4 4.1  CL 103   < > 109   < > 113* 114* 113*  CO2 21*   < > 21*   < > 24 21* 21*  GLUCOSE 263*   < > 217*   < > 124* 122* 130*  BUN 29*   < > 33*   < > 29* 33* 33*  CREATININE 2.96*   < > 2.92*   < > 2.76* 2.60* 2.36*  CALCIUM 8.8*   < > 7.9*   < > 8.5* 8.4* 8.3*  PROT 6.8  --  5.7*  --   --   --   --   ALBUMIN 3.9  --  3.3*   < > 2.6* 2.4* 2.3*  AST 28  --  22  --   --   --   --   ALT 19  --  23  --   --   --   --   ALKPHOS 72  --  65  --   --   --   --   BILITOT 1.0  --  0.6  --   --   --   --   GFRNONAA 27*   < > 28*   < > 30* 32* 36*  ANIONGAP 13   < > 11   < > 5 5 6    < > =  values in this interval not displayed.     Hematology Recent Labs  Lab 03/18/20 0139 03/18/20 0940 03/19/20 0401  WBC 7.3 6.7 6.0  RBC 3.59* 3.39* 3.37*  HGB 11.0* 10.6* 10.4*  HCT 34.7* 33.0* 32.1*  MCV 96.7 97.3 95.3  MCH 30.6 31.3 30.9  MCHC 31.7 32.1 32.4  RDW 13.5 13.4 13.1  PLT 163 170 171    BNP Recent Labs  Lab 02/24/2020 0700  BNP 229.5*     DDimer No results for input(s): DDIMER in the last 168 hours.   Radiology    CT HEAD WO CONTRAST  Result Date: 03/17/2020 CLINICAL DATA:  35 year old male status post CPR. Worsening mental status. 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 EXAM: CT HEAD WITHOUT CONTRAST TECHNIQUE: Contiguous axial images were obtained from the base of the skull through the vertex without intravenous contrast. COMPARISON:  Brain MRI 03/15/2020 and earlier. FINDINGS: Brain: Inconspicuous gray-white matter differentiation throughout the brain, although not significantly changed from the head CT 02/29/2020. And bilateral sulci do not appear to have changed. Ventricle size is stable and within normal limits. Increased hypodensity in the left pons corresponding to known infarct. No acute intracranial hemorrhage identified. No new cortically based infarct identified. No intracranial mass effect. Normal basilar cisterns. Vascular: No suspicious intracranial vascular hyperdensity. Skull: No acute osseous abnormality identified. Sinuses/Orbits: Mild sinus opacification in the setting of intubation. Tympanic cavities and mastoids remain clear. Other: No acute orbit or scalp soft tissue finding. Fluid in the pharynx in the setting of intubation. IMPRESSION: 1. No evidence of anoxic injury by CT. Inconspicuous gray-white matter differentiation throughout the brain, but stable from CT 02/27/2020 that predated the MRI (03/15/2020) which was negative for diffuse anoxic injury. 2. 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 No  intracranial hemorrhage or mass effect. Electronically Signed   By: Lemmie Evens  [place] Crane M.D.   On: 03/17/2020 11:19   DG Chest Port 1 View  Result Date: 03/18/2020 CLINICAL DATA:  Acute respiratory failure. EXAM: PORTABLE CHEST 1 VIEW COMPARISON:  03/16/2020 FINDINGS: 0520 hours. Endotracheal tube tip is 7.1 cm above the base of the carina. The NG tube passes into the stomach although the distal tip position is not included on the film. Low lung volumes. Retrocardiac collapse/consolidation is progressive in the interval with probable small left pleural effusion. There is pulmonary vascular congestion without overt pulmonary edema. Telemetry leads overlie the chest. IMPRESSION: 1. Endotracheal tube tip is 7.1 cm above the base of the carina. 2. Worsening retrocardiac collapse/consolidation with probable small left pleural effusion. Electronically Signed   By: Misty Stanley M.D.   On: 03/18/2020 06:15    Cardiac Studies   TTE (02/17/2020):  1. Left ventricular ejection fraction, by estimation, is 20 to 25%. The  left ventricle has severely decreased function. The left ventricle  demonstrates global hypokinesis. There is mild left ventricular  hypertrophy. Left ventricular diastolic parameters  are consistent with Grade I diastolic dysfunction (impaired relaxation).  2. Right ventricular systolic function is severely reduced. The right  ventricular size is normal.  3. The mitral valve is normal in structure. No evidence of mitral valve  regurgitation.  4. The aortic valve is normal in structure. Aortic valve regurgitation is  trivial.  5. Aortic dilatation noted. There is mild dilatation of the aortic root,  measuring 42 mm.   Patient Profile     35 y.o. male with history of persistent atrial fibrillation, hypertension, CKD stage IIIb, and tobacco abuse, admitted with VF arrest complicated by cardiogenic shock.  Assessment & Plan    Ventricular fibrillation arrest: Patient maintaining sinus  rhythm, still on amiodarone infusion.  Transition amiodarone infusion to amiodarone 400 mg daily via enteric tube.  Ischemia evaluation if patient has meaningful neurologic recovery.  Continue carvedilol 3.125 mg twice daily.  Maintain potassium and magnesium greater than 4.0 and 2.0, respectively.  Cardiogenic shock and acute HFrEF: Mr. Brodowski appears euvolemic and well perfused today.  LVEF noted to be significantly reduced at 20-25% shortly after admission (previously 55-60% in 01/2020).  Question if this represents myocardial stunning in the setting of VF and CPR/defibrillation versus acute decompensation of his heart failure.  Continue carvedilol 3.125 mg twice daily.  The setting of significant hypertension, I will add a low-dose hydralazine and isosorbide dinitrate.  Renal insufficiency precludes addition of ACE inhibitor/ARB and aldosterone antagonist at this time.  Maintain net even fluid balance.  Persistent atrial fibrillation: Mr. Sexson maintaining sinus rhythm since following defibrillation for VF prior to admission.  Transition amiodarone to oral dosing as above.  Continue IV heparin.  For questions or updates, please contact [place] Please consult [email or web address] for contact info under [facility].     Signed, Nelva Bush, MD  03/19/2020, 10:50 AM

## 2020-03-19 NOTE — Progress Notes (Signed)
[facility]  ROUNDING NOTE   Subjective:   UOP 2556m.  Creatinine 2.36 (2.6)  Objective:  Vital signs in last 24 hours:  Temp:  [96.08 F (35.6 C)-99.14 F (37.3 C)] 98.42 F (36.9 C) (01/04 0900) Pulse Rate:  [60-80] 66 (01/04 0900) Resp:  [21-30] 21 (01/04 0900) BP: (153-209)/(85-100) 209/98 (01/04 1004) SpO2:  [93 %-98 %] 97 % (01/04 0900) FiO2 (%):  [28 %-40 %] 40 % (01/04 1122)  Weight change:  Filed Weights   03/11/2020 1027  Weight: 106.2 kg    Intake/Output: I/O last 3 completed shifts: In: 6805.9 [I.V.:5307.6; NG/GT:950; IV Piggyback:548.3] Out: 3230 [Urine:3230]   Intake/Output this shift:  Total I/O In: 369 [I.V.:156.1; NG/GT:210; IV Piggyback:2.8] Out: 175 [Urine:175]  Physical Exam: General:  Critically ill   Head:  ETT  Eyes:  PERRL  Neck:  trachea midline  Lungs:   SBT FiO2 40%  Heart:  regular  Abdomen:   Soft, bowel sounds present  Extremities:  1+ peripheral edema.  Neurologic:  Intubated, sedated  Skin:  No lesions  Access:  No hemodialysis access    Basic Metabolic Panel: Recent Labs  Lab 03/16/20 0556 03/16/20 0557 03/17/20 0409 03/18/20 0139 03/18/20 0940 03/19/20 0401  NA  --  140 141 142 140 140  K  --  3.8 4.4 4.6 4.4 4.1  CL  --  110 111 113* 114* 113*  CO2  --  22 20* 24 21* 21*  GLUCOSE  --  126* 113* 124* 122* 130*  BUN  --  28* 28* 29* 33* 33*  CREATININE  --  3.12* 3.16* 2.76* 2.60* 2.36*  CALCIUM  --  8.4* 8.3* 8.5* 8.4* 8.3*  MG 1.8  --  1.6* 2.7* 2.5* 2.3  PHOS  --  3.6 4.2 4.5  4.4 4.0  4.2 3.8    Liver Function Tests: Recent Labs  Lab 03/02/2020 0700 03/12/2020 1708 03/16/20 0557 03/17/20 0409 03/18/20 0139 03/18/20 0940 03/19/20 0401  AST 28 22  --   --   --   --   --   ALT 19 23  --   --   --   --   --   ALKPHOS 72 65  --   --   --   --   --   BILITOT 1.0 0.6  --   --   --   --   --   PROT 6.8 5.7*  --   --   --   --   --   ALBUMIN 3.9 3.3* 2.6* 2.6* 2.6* 2.4* 2.3*   No results for  input(s): LIPASE, AMYLASE in the last 168 hours. No results for input(s): AMMONIA in the last 168 hours.  CBC: Recent Labs  Lab 02/25/2020 0700 03/14/20 0334 03/16/20 0556 03/17/20 0409 03/18/20 0139 03/18/20 0940 03/19/20 0401  WBC 17.2*   < > 7.5 8.4 7.3 6.7 6.0  NEUTROABS 10.2*  --   --   --   --   --   --   HGB 15.9   < > 11.1* 10.8* 11.0* 10.6* 10.4*  HCT 47.2   < > 34.0* 33.6* 34.7* 33.0* 32.1*  MCV 91.7   < > 95.0 96.0 96.7 97.3 95.3  PLT 254   < > 149* 156 163 170 171   < > = values in this interval not displayed.    Cardiac Enzymes: No results for input(s): CKTOTAL, CKMB, CKMBINDEX, TROPONINI in the last 168 hours.  BNP:  Invalid input(s): POCBNP  CBG: Recent Labs  Lab 03/18/20 2012 03/18/20 2326 03/19/20 0306 03/19/20 0732 03/19/20 1129  GLUCAP 104* 111* 100* 102* 125*    Microbiology: Results for orders placed or performed during the hospital encounter of 02/29/2020  Resp Panel by RT-PCR (Flu A&B, Covid) Nasopharyngeal Swab     Status: None   Collection Time: 03/04/2020  7:00 AM   Specimen: Nasopharyngeal Swab; Nasopharyngeal(NP) swabs in vial transport medium  Result Value Ref Range Status   SARS Coronavirus 2 by RT PCR NEGATIVE NEGATIVE Final    Comment: (NOTE) SARS-CoV-2 target nucleic acids are NOT DETECTED.  The SARS-CoV-2 RNA is generally detectable in upper respiratory specimens during the acute phase of infection. The lowest concentration of SARS-CoV-2 viral copies this assay can detect is 138 copies/mL. A negative result does not preclude SARS-Cov-2 infection and should not be used as the sole basis for treatment or other patient management decisions. A negative result may occur with  improper specimen collection/handling, submission of specimen other than nasopharyngeal swab, presence of viral mutation(s) within the areas targeted by this assay, and inadequate number of viral copies(<138 copies/mL). A negative result must be combined  with clinical observations, patient history, and epidemiological information. The expected result is Negative.  Fact Sheet for Patients:  EntrepreneurPulse.com.au  Fact Sheet for Healthcare Providers:  IncredibleEmployment.be  This test is no t yet approved or cleared by the [place] FDA and  has been authorized for detection and/or diagnosis of SARS-CoV-2 by FDA under an Emergency Use Authorization (EUA). This EUA will remain  in effect (meaning this test can be used) for the duration of the COVID-19 declaration under Section 564(b)(1) of the Act, 21 U.S.C.section 360bbb-3(b)(1), unless the authorization is terminated  or revoked sooner.       Influenza A by PCR NEGATIVE NEGATIVE Final   Influenza B by PCR NEGATIVE NEGATIVE Final    Comment: (NOTE) The Xpert Xpress SARS-CoV-2/FLU/RSV plus assay is intended as an aid in the diagnosis of influenza from Nasopharyngeal swab specimens and should not be used as a sole basis for treatment. Nasal washings and aspirates are unacceptable for Xpert Xpress SARS-CoV-2/FLU/RSV testing.  Fact Sheet for Patients: EntrepreneurPulse.com.au  Fact Sheet for Healthcare Providers: IncredibleEmployment.be  This test is not yet approved or cleared by the [place] FDA and has been authorized for detection and/or diagnosis of SARS-CoV-2 by FDA under an Emergency Use Authorization (EUA). This EUA will remain in effect (meaning this test can be used) for the duration of the COVID-19 declaration under Section 564(b)(1) of the Act, 21 U.S.C. section 360bbb-3(b)(1), unless the authorization is terminated or revoked.  Performed at [facility], [address] [place], [place] [address]   Blood Culture (routine x 2)     Status: None   Collection Time: 03/07/2020  7:08 AM   Specimen: BLOOD  Result Value Ref Range Status   Specimen Description BLOOD LEFT [facility]   Final   Special Requests   Final    BOTTLES DRAWN AEROBIC AND ANAEROBIC Blood Culture adequate volume   Culture   Final    NO GROWTH 5 DAYS Performed at [facility], [address] [place], [place] [address]    Report Status 03/18/2020 FINAL  Final  Blood Culture (routine x 2)     Status: None   Collection Time: 03/08/2020  7:08 AM   Specimen: BLOOD  Result Value Ref Range Status   Specimen Description BLOOD RIGHT HAND  Final  Special Requests   Final    BOTTLES DRAWN AEROBIC AND ANAEROBIC Blood Culture adequate volume   Culture   Final    NO GROWTH 5 DAYS Performed at [facility], [address] [place], [place] [address]    Report Status 03/18/2020 FINAL  Final  Urine culture     Status: None   Collection Time: 02/29/2020  7:10 AM   Specimen: In/Out Cath Urine  Result Value Ref Range Status   Specimen Description   Final    IN/OUT CATH URINE Performed at [facility], [address], [place], [place] [address]    Special Requests   Final    NONE Performed at [facility] [address], [place], [place] [address]    Culture   Final    NO GROWTH Performed at [facility], [place] [address] [place], [place] [address]    Report Status 03/14/2020 FINAL  Final  MRSA PCR Screening     Status: None   Collection Time: 02/18/2020 10:43 AM   Specimen: Nasopharyngeal  Result Value Ref Range Status   MRSA by PCR NEGATIVE NEGATIVE Final    Comment:        The GeneXpert MRSA Assay (FDA approved for NASAL specimens only), is one component of a comprehensive MRSA colonization surveillance program. It is not intended to diagnose MRSA infection nor to guide or monitor treatment for MRSA infections. Performed at [facility], [address] [place], [place] [address]   Culture, respiratory (non-expectorated)     Status: None   Collection Time: 03/15/20  6:53 PM   Specimen: Tracheal Aspirate; Respiratory  Result Value Ref  Range Status   Specimen Description   Final    TRACHEAL ASPIRATE Performed at [facility], [address] [place], [place] [address]    Special Requests   Final    NONE Performed at [facility], [address] [place], [place] [address]    Gram Stain   Final    FEW WBC PRESENT, PREDOMINANTLY PMN ABUNDANT GRAM NEGATIVE RODS FEW GRAM POSITIVE COCCI IN PAIRS IN CLUSTERS Performed at [facility], [place] [address] [place],  [address]    Culture ABUNDANT STREPTOCOCCUS PNEUMONIAE  Final   Report Status 03/18/2020 FINAL  Final   Organism ID, Bacteria STREPTOCOCCUS PNEUMONIAE  Final      Susceptibility   Streptococcus pneumoniae - MIC*    ERYTHROMYCIN 2 RESISTANT Resistant     LEVOFLOXACIN 1 SENSITIVE Sensitive     VANCOMYCIN 0.5 SENSITIVE Sensitive     PENO - penicillin <=0.06      PENICILLIN (non-meningitis) <=0.06 SENSITIVE Sensitive     PENICILLIN (oral) <=0.06 SENSITIVE Sensitive     CEFTRIAXONE (non-meningitis) <=0.12 SENSITIVE Sensitive     * ABUNDANT STREPTOCOCCUS PNEUMONIAE    Coagulation Studies: No results for input(s): LABPROT, INR in the last 72 hours.  Urinalysis: No results for input(s): COLORURINE, LABSPEC, PHURINE, GLUCOSEU, HGBUR, BILIRUBINUR, KETONESUR, PROTEINUR, UROBILINOGEN, NITRITE, LEUKOCYTESUR in the last 72 hours.  Invalid input(s): APPERANCEUR    Imaging: DG Chest Port 1 View  Result Date: 03/18/2020 CLINICAL DATA:  Acute respiratory failure. EXAM: PORTABLE CHEST 1 VIEW COMPARISON:  03/16/2020 FINDINGS: 0520 hours. Endotracheal tube tip is 7.1 cm above the base of the carina. The NG tube passes into the stomach although the distal tip position is not included on the film. Low lung volumes. Retrocardiac collapse/consolidation is progressive in the interval with probable small left  pleural effusion. There is pulmonary vascular congestion without overt pulmonary edema. Telemetry leads overlie the chest. IMPRESSION: 1.  Endotracheal tube tip is 7.1 cm above the base of the carina. 2. Worsening retrocardiac collapse/consolidation with probable small left pleural effusion. Electronically Signed   By: Misty Stanley M.D.   On: 03/18/2020 06:15     Medications:   . cefTRIAXone (ROCEPHIN)  IV 200 mL/hr at 03/19/20 0800  . dexmedetomidine (PRECEDEX) IV infusion 0.8 mcg/kg/hr (03/19/20 0929)  . dextrose 5 % and 0.9% NaCl 75 mL/hr at 03/19/20 0800  . famotidine (PEPCID) IV 20 mg (03/19/20 1114)  . fentaNYL infusion INTRAVENOUS 175 mcg/hr (03/19/20 0800)  . heparin 1,500 Units/hr (03/19/20 0800)  . metronidazole 500 mg (03/19/20 0923)   . amiodarone  400 mg Oral Daily  . atorvastatin  40 mg Per Tube QHS  . carvedilol  3.125 mg Per Tube BID WC  . chlorhexidine  15 mL Mouth Rinse BID  . Chlorhexidine Gluconate Cloth  6 each Topical Q0600  . docusate  100 mg Per Tube BID  . fentaNYL  100 mcg Intravenous Once  . hydrALAZINE  10 mg Per Tube TID  . insulin aspart  0-9 Units Subcutaneous Q4H  . ipratropium-albuterol  3 mL Nebulization Q6H  . isosorbide dinitrate  5 mg Per Tube TID  . mouth rinse  15 mL Mouth Rinse q12n4p  . midazolam  2 mg Intravenous Once  . polyethylene glycol  17 g Per Tube Daily   acetaminophen (TYLENOL) oral liquid 160 mg/5 mL, atropine, dextrose, fentaNYL, fentaNYL, hydrALAZINE, midazolam, ondansetron (ZOFRAN) IV, sodium chloride flush  Assessment/ Plan:   Mr. Jeremiah Keith is a 35 y.o. 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   1.  Acute kidney injury on chronic kidney disease stage IIIb  baseline creatinine 2.27 with an EGFR of 38 on 02/10/20. History of proteinuria Secondary to ATN from hypotension, cardiac arrest and  renal ischemia. Nonoliguric urine output.  - No indication for dialysis.   2.  Acute respiratory failure: requiring intubation and mechanical ventilation. Now with aspiration pneumonia on ceftriaxone and metronidazole.   3. Acute exacerbation of systolic congestive heart failure: with atrial fibrillation. Weaned off vasopressors from cardiogenic shock.  - amiodaron gtt.  - carvedilol, isosorbide dinitrate and hydralazine.      LOS: 6 Rilynn Habel 1/4/202212:22 PM

## 2020-03-20 ENCOUNTER — Inpatient Hospital Stay: Payer: Medicaid Other

## 2020-03-20 DIAGNOSIS — I469 Cardiac arrest, cause unspecified: Secondary | ICD-10-CM

## 2020-03-20 DIAGNOSIS — Z7189 Other specified counseling: Secondary | ICD-10-CM

## 2020-03-20 DIAGNOSIS — J9601 Acute respiratory failure with hypoxia: Secondary | ICD-10-CM

## 2020-03-20 DIAGNOSIS — Z515 Encounter for palliative care: Secondary | ICD-10-CM

## 2020-03-20 DIAGNOSIS — I472 Ventricular tachycardia: Secondary | ICD-10-CM | POA: Diagnosis not present

## 2020-03-20 DIAGNOSIS — I5082 Biventricular heart failure: Secondary | ICD-10-CM | POA: Diagnosis not present

## 2020-03-20 DIAGNOSIS — I499 Cardiac arrhythmia, unspecified: Secondary | ICD-10-CM | POA: Diagnosis not present

## 2020-03-20 DIAGNOSIS — Z66 Do not resuscitate: Secondary | ICD-10-CM

## 2020-03-20 DIAGNOSIS — T17908A Unspecified foreign body in respiratory tract, part unspecified causing other injury, initial encounter: Secondary | ICD-10-CM

## 2020-03-20 LAB — RENAL FUNCTION PANEL
Albumin: 2.4 g/dL — ABNORMAL LOW (ref 3.5–5.0)
Anion gap: 7 (ref 5–15)
BUN: 42 mg/dL — ABNORMAL HIGH (ref 6–20)
CO2: 26 mmol/L (ref 22–32)
Calcium: 8.7 mg/dL — ABNORMAL LOW (ref 8.9–10.3)
Chloride: 111 mmol/L (ref 98–111)
Creatinine, Ser: 2.61 mg/dL — ABNORMAL HIGH (ref 0.61–1.24)
GFR, Estimated: 32 mL/min — ABNORMAL LOW (ref >60)
Glucose, Bld: 132 mg/dL — ABNORMAL HIGH (ref 70–99)
Phosphorus: 4 mg/dL (ref 2.5–4.6)
Potassium: 3.8 mmol/L (ref 3.5–5.1)
Sodium: 144 mmol/L (ref 135–145)

## 2020-03-20 LAB — GLUCOSE, CAPILLARY
Glucose-Capillary: 99 mg/dL (ref 70–99)
Glucose-Capillary: 92 mg/dL (ref 70–99)
Glucose-Capillary: 91 mg/dL (ref 70–99)

## 2020-03-20 LAB — MAGNESIUM: Magnesium: 2.3 mg/dL (ref 1.7–2.4)

## 2020-03-20 LAB — CBC
HCT: 34.8 % — ABNORMAL LOW (ref 39.0–52.0)
Hemoglobin: 11.1 g/dL — ABNORMAL LOW (ref 13.0–17.0)
MCH: 30.9 pg (ref 26.0–34.0)
MCHC: 31.9 g/dL (ref 30.0–36.0)
MCV: 96.9 fL (ref 80.0–100.0)
Platelets: 204 10*3/uL (ref 150–400)
RBC: 3.59 MIL/uL — ABNORMAL LOW (ref 4.22–5.81)
RDW: 13 % (ref 11.5–15.5)
WBC: 8.6 10*3/uL (ref 4.0–10.5)
nRBC: 0 % (ref 0.0–0.2)

## 2020-03-20 LAB — HEPARIN LEVEL (UNFRACTIONATED): Heparin Unfractionated: 0.26 IU/mL — ABNORMAL LOW (ref 0.30–0.70)

## 2020-03-20 LAB — PROCALCITONIN: Procalcitonin: 2.91 ng/mL

## 2020-03-20 MED ORDER — GLYCOPYRROLATE 1 MG PO TABS
1.0000 mg | ORAL_TABLET | ORAL | Status: DC | PRN
Start: 2020-03-20 — End: 2020-03-21
  Filled 2020-03-20: qty 1

## 2020-03-20 MED ORDER — ACETAMINOPHEN 650 MG RE SUPP: 650.0000 mg | Freq: Four times a day (QID) | RECTAL | Status: DC | PRN

## 2020-03-20 MED ORDER — GLYCOPYRROLATE 0.2 MG/ML IJ SOLN: 0.2000 mg | INTRAMUSCULAR | Status: DC | PRN

## 2020-03-20 MED ORDER — MIDAZOLAM HCL 2 MG/2ML IJ SOLN
4.0000 mg | INTRAMUSCULAR | Status: DC | PRN
  Administered 2020-03-20: 4 mg via INTRAVENOUS
  Filled 2020-03-20: qty 4

## 2020-03-20 MED ORDER — DEXTROSE 5 % IV SOLN: INTRAVENOUS | Status: DC

## 2020-03-20 MED ORDER — MORPHINE 100MG IN NS 100ML (1MG/ML) PREMIX INFUSION
0.0000 mg/h | INTRAVENOUS | Status: DC
  Administered 2020-03-20: 5 mg/h via INTRAVENOUS
  Filled 2020-03-20: qty 100

## 2020-03-20 MED ORDER — HEPARIN BOLUS VIA INFUSION
1200.0000 [IU] | Freq: Once | INTRAVENOUS | Status: DC
  Filled 2020-03-20: qty 1200

## 2020-03-20 MED ORDER — ACETAMINOPHEN 325 MG PO TABS: 650.0000 mg | ORAL_TABLET | Freq: Four times a day (QID) | ORAL | Status: DC | PRN

## 2020-03-20 MED ORDER — FUROSEMIDE 10 MG/ML IJ SOLN
40.0000 mg | Freq: Every day | INTRAMUSCULAR | Status: DC
  Administered 2020-03-20: 40 mg via INTRAVENOUS
  Filled 2020-03-20: qty 4

## 2020-03-20 MED ORDER — POLYVINYL ALCOHOL 1.4 % OP SOLN
1.0000 [drp] | Freq: Four times a day (QID) | OPHTHALMIC | Status: DC | PRN
  Filled 2020-03-20: qty 15

## 2020-03-20 MED ORDER — MORPHINE BOLUS VIA INFUSION
5.0000 mg | INTRAVENOUS | Status: DC | PRN
Start: 2020-03-20 — End: 2020-03-21
  Filled 2020-03-20: qty 5

## 2020-03-20 MED ORDER — MORPHINE SULFATE (PF) 2 MG/ML IV SOLN: 2.0000 mg | INTRAVENOUS | Status: DC | PRN

## 2020-03-20 MED ORDER — DIPHENHYDRAMINE HCL 50 MG/ML IJ SOLN: 25.0000 mg | INTRAMUSCULAR | Status: DC | PRN

## 2020-03-20 MED ORDER — HEPARIN BOLUS VIA INFUSION
1000.0000 [IU] | Freq: Once | INTRAVENOUS | Status: AC
  Administered 2020-03-20: 1000 [IU] via INTRAVENOUS
  Filled 2020-03-20: qty 1000

## 2020-03-21 NOTE — Progress Notes (Signed)
Patient expired at 2023 and pronounced by two RNs.  Patient at the beginning of the shift had end of life gasps when breathing and was struggling.  Patient received a bolus of Versed and Morphine per MAR during the process.  Patient made comfortable with cool washcloth to forehead due to sewating.  Patient had mom and relative by his side til he expired.  Mother Jeremiah Keith was comforted through the process.    Wife Jeremiah Keith and Father Jeremiah Keith at patient's bedsie prior to my shift.  Wife was called at [phone] and informed of him passing.  She was also contacted by the mother.  I explained the process of patient placement calling her since the funeral home is unknown at this time.  All questions were answered at the time of the call.

## 2020-04-16 NOTE — Progress Notes (Signed)
   04-03-20 2045  Clinical Encounter Type  Visited With Family;Health care provider  Visit Type Death  Referral From Nurse   Referral received to support the patient's family (mother and cousin) in the wake of his passing. Upon arrival, the patient's loved ones had already walked to the family waiting room where they were receiving emotional support from his nurse. They were understandably heartbroken. This chaplain was asked to walk them to the main entrance and did so, while providing grief/emotional support and words of comfort. The patient's mother shared that he was her first and only son. She shared that they do have good support from family, friends, and their church community, but their loss and grief are unimaginable.   Gennaro Africa, Chaplain

## 2020-04-16 NOTE — Progress Notes (Addendum)
Progress Note  Patient Name: Jeremiah Keith Date of Encounter: 2020-04-03  [facility] Cardiologist: [name], MD   Subjective   Intubated, sedated.  No acute events overnight.  Received IV Lasix 80 mg IV yesterday, net -2.8 L over the past 24 hours.  Inpatient Medications    Scheduled Meds: . amiodarone  400 mg Per Tube Daily  . atorvastatin  40 mg Per Tube QHS  . carvedilol  3.125 mg Per Tube BID WC  . chlorhexidine gluconate (MEDLINE KIT)  15 mL Mouth Rinse BID  . Chlorhexidine Gluconate Cloth  6 each Topical Q0600  . docusate  100 mg Per Tube BID  . feeding supplement (PROSource TF)  45 mL Per Tube Daily  . fentaNYL  100 mcg Intravenous Once  . furosemide  40 mg Intravenous Daily  . hydrALAZINE  10 mg Per Tube TID  . insulin aspart  0-9 Units Subcutaneous Q4H  . ipratropium-albuterol  3 mL Nebulization Q6H  . isosorbide dinitrate  5 mg Per Tube TID  . mouth rinse  15 mL Mouth Rinse 10 times per day  . midazolam  2 mg Intravenous Once  . midazolam  4 mg Intravenous Once  . polyethylene glycol  17 g Per Tube Daily   Continuous Infusions: . cefTRIAXone (ROCEPHIN)  IV 2 g (April 03, 2020 0926)  . dexmedetomidine (PRECEDEX) IV infusion Stopped (03/19/20 1321)  . dextrose 5 % and 0.9% NaCl 75 mL/hr at 04/03/20 0400  . famotidine (PEPCID) IV 20 mg (04/03/2020 0937)  . feeding supplement (VITAL HIGH PROTEIN) 20 mL/hr at 04/03/20 0400  . fentaNYL infusion INTRAVENOUS 300 mcg/hr (Apr 03, 2020 0946)  . heparin 1,650 Units/hr (2020-04-03 0934)  . metronidazole 500 mg (03-Apr-2020 0945)  . midazolam 7 mg/hr (04-03-20 0948)  . niCARDipine Stopped (03/19/20 1900)   PRN Meds: acetaminophen (TYLENOL) oral liquid 160 mg/5 mL, atropine, dextrose, fentaNYL, fentaNYL, hydrALAZINE, midazolam, ondansetron (ZOFRAN) IV, sodium chloride flush   Vital Signs    Vitals:   04/03/20 0900 April 03, 2020 0923 04/03/20 1000 04-03-2020 1100  BP: 128/68 128/68 (!) 141/71 129/67  Pulse: 78 79 81 76   Resp: 17  (!) 21 15  Temp: 99.68 F (37.6 C)  99.14 F (37.3 C) 99.86 F (37.7 C)  TempSrc:      SpO2: (!) 89%  95% 97%  Weight:      Height:        Intake/Output Summary (Last 24 hours) at 04-03-20 1250 Last data filed at 03-Apr-2020 0600 Gross per 24 hour  Intake 1964.56 ml  Output 5800 ml  Net -3835.44 ml   Last 3 Weights 02/27/2020 03/11/2020 02/16/2020  Weight (lbs) 234 lb 2.1 oz 222 lb 225 lb  Weight (kg) 106.2 kg 100.699 kg 102.059 kg      Telemetry    Sinus rhythm- Personally Reviewed  ECG    No new tracing obtained- Personally Reviewed  Physical Exam   GEN:  Intubated, sedated Neck: No JVD Cardiac: RRR, no murmurs, rubs, or gallops.  Respiratory:  Vented breath sounds GI: Soft, nontender, non-distended  MS: No edema; No deformity. Neuro:   Unable to assess Psych: Unable to assess  Labs    High Sensitivity Troponin:   Recent Labs  Lab 02/19/2020 0700 02/14/2020 0903 02/26/2020 1237 02/18/2020 1708 02/26/2020 2043  TROPONINIHS 22* 47* 106* 126* 136*      Chemistry Recent Labs  Lab 03/06/2020 1708 02/27/2020 2043 03/18/20 0940 03/19/20 0401 04/03/20 0524  NA 141   < > 140  140 144  K 4.1   < > 4.4 4.1 3.8  CL 109   < > 114* 113* 111  CO2 21*   < > 21* 21* 26  GLUCOSE 217*   < > 122* 130* 132*  BUN 33*   < > 33* 33* 42*  CREATININE 2.92*   < > 2.60* 2.36* 2.61*  CALCIUM 7.9*   < > 8.4* 8.3* 8.7*  PROT 5.7*  --   --   --   --   ALBUMIN 3.3*   < > 2.4* 2.3* 2.4*  AST 22  --   --   --   --   ALT 23  --   --   --   --   ALKPHOS 65  --   --   --   --   BILITOT 0.6  --   --   --   --   GFRNONAA 28*   < > 32* 36* 32*  ANIONGAP 11   < > _0 < > = values in this interval not displayed.     Hematology Recent Labs  Lab 03/18/20 0940 03/19/20 0401 March 27, 2020 0524  WBC 6.7 6.0 8.6  RBC 3.39* 3.37* 3.59*  HGB 10.6* 10.4* 11.1*  HCT 33.0* 32.1* 34.8*  MCV 97.3 95.3 96.9  MCH 31.3 30.9 30.9  MCHC 32.1 32.4 31.9  RDW 13.4 13.1 13.0  PLT 170 171 204     BNPNo results for input(s): BNP, PROBNP in the last 168 hours.   DDimer No results for input(s): DDIMER in the last 168 hours.   Radiology    DG CHEST PORT 1 VIEW  Result Date: 03-27-20 CLINICAL DATA:  CHF. EXAM: PORTABLE CHEST 1 VIEW COMPARISON:  03/19/2020. FINDINGS: Endotracheal tube and NG tube stable position. Stable cardiomegaly. Interstitial infiltrates/edema again noted. Interim slight improvement from prior exam. Lung volumes with bilateral subsegmental atelectasis. Tiny bilateral pleural effusions cannot be excluded. No pneumothorax. IMPRESSION: 1. Lines and tubes stable position. 2. Stable cardiomegaly. Bilateral interstitial infiltrates/edema again noted. Interim improvement from prior exam. 3. Low lung volumes with bilateral subsegmental atelectasis. Electronically Signed   By: Marcello Moores  Register   On: 03/27/20 08:41   DG Chest Port 1 View  Result Date: 03/19/2020 CLINICAL DATA:  Intubation.  OG tube placement. EXAM: PORTABLE CHEST 1 VIEW COMPARISON:  Chest x-ray 03/18/2020. FINDINGS: Endotracheal tube and NG tube in stable position. Stable cardiomegaly. Low lung volumes. Diffuse bilateral pulmonary infiltrates/edema noted on today's exam. No pleural effusion or pneumothorax no acute bony abnormality. IMPRESSION: 1. Endotracheal tube and NG tube in stable position. 2. Stable cardiomegaly. 3. Low lung volumes. Diffuse bilateral pulmonary infiltrates/edema noted on today's exam. Electronically Signed   By: Marcello Moores  Register   On: 03/19/2020 13:55   DG Abd Portable 1V  Result Date: 03/19/2020 CLINICAL DATA:  Status post NG tube placement. EXAM: PORTABLE ABDOMEN - 1 VIEW COMPARISON:  Single-view of the abdomen 03/16/2020. FINDINGS: NG tube is in place with the tip and side-port in the stomach. IMPRESSION: NG tube in good position. Electronically Signed   By: Inge Rise M.D.   On: 03/19/2020 13:59    Cardiac Studies   Echocardiogram 02/20/2020 1. Left ventricular ejection  fraction, by estimation, is 20 to 25%. The  left ventricle has severely decreased function. The left ventricle  demonstrates global hypokinesis. There is mild left ventricular  hypertrophy. Left ventricular diastolic parameters  are consistent with Grade I diastolic dysfunction (impaired relaxation).  2. Right ventricular systolic function is severely reduced. The right  ventricular size is normal.  3. The mitral valve is normal in structure. No evidence of mitral valve  regurgitation.  4. The aortic valve is normal in structure. Aortic valve regurgitation is  trivial.  5. Aortic dilatation noted. There is mild dilatation of the aortic root,  measuring 42 mm.   Patient Profile     35 y.o. male with history of persistent atrial fibrillation, CKD 3, hypertension, smoker presenting to the hospital after a V. fib arrest.  Underwent defibrillation with achievement of ROSC.  Assessment & Plan    1.  V. fib arrest -Intubated, sedated -BP maintained off pressors. -Right and left heart cath if neuro function improves/reasonable -Continue p.o. amiodarone 400 daily. -Maintaining sinus rhythm, no further episodes of arrhythmia  2.  Biventricular failure, EF 25% -Net -2.8 L -Chest x-ray today with bilateral interstitial edema -Start Lasix 40 mg IV daily -Monitor creatinine, consider holding Lasix if creatinine becomes worse -Intentional CHF therapy pending neuro status.  3.  Paroxysmal A. Fib -Maintaining sinus rhythm since atrial fibrillation -Amiodarone, heparin drip.  Total encounter time 35 minutes  Greater than 50% was spent in counseling and coordination of care with the patient      Signed, [name], MD  03/23/2020, 12:50 PM

## 2020-04-16 NOTE — Progress Notes (Signed)
NAME:  Jeremiah Keith, MRN:  [id number], DOB:  [birth year], LOS: 7 ADMISSION DATE:  03/04/2020, CONSULTATION DATE:  03/10/2020 REFERRING MD:  Dr. Corky Downs, CHIEF COMPLAINT:  Cardiac Arrest   Brief History:  35 y.o. Male with a PMH significant for Paroxsymal Atrial fibrillation, Hypertension, and CKD admitted s/p out of hospital cardiac arrest (V-fib).  Being placed on Targeted Temperature Management @ 36 C. 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  Patient profile:  Jeremiah Keith is a 35 year old male with a past medical history significant for paroxysmal atrial fibrillation on Eliquis for 4 weeks, chronic kidney disease, and hypertension who presented to [facility] ED on 02/24/2020 following an out of hospital V. fib cardiac arrest.  Apparently he was scheduled to have a cardioversion done today and in route to the appointment his father noted he became unresponsive in the car where he subsequently pulled into an EMS station.  CPR was started, and he received defibrillation x3 for reported V. fib arrest with ROSC obtained.  Estimated time of collapse to CPR is 4 minutes, with ROSC obtained after 6 minutes 43 seconds.  ED course: Upon arrival to the ED he was emergently intubated.  He remained unresponsive therefore he was placed on targeted temperature management at 36 C.  Cardiology was consulted and recommended placing on amiodarone drip.  Initial work-up shows bicarb 21, BUN 29, creatinine 2.96, glucose 263, BNP 229.5, high-sensitivity troponin 22, lactic acid 5.7, WBC 17.2 with neutrophilia, and procalcitonin is negative.  Chest x-ray is negative for any acute cardiopulmonary process.  His SARS-CoV-2 PCR and influenza PCR both negative.  Urinalysis is not consistent with UTI.  Urine drug screen is positive for cannabinoid.  CT head obtained which showed mild hypodensity in the frontal white matter (right greater than left) which is  nonspecific in appearance, along with possible chronic ischemia.  Post admission to ICU he is now hypotensive requiring vasopressors.  Echocardiogram performed revealing Cardiomyopathy and biventricular failure with EF of 20 to 25%.  Central venous access and arterial line placed.  Patient underwent targeted hypothermia protocol.  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  Reviewed hospital course and findings - overall very poor prognosis - recommed comfort measures due to advanced CHF and multiple CVA 03/20/19- patient was showing some signs of clinical improvement (mouthing words and following verbal communication, he did have weakness in LUE and LLE but did pass SBT today, I met with mother of patient and discussed high risk extubation due to Advanced CHF with CVA.  Mother understood that patient is with poor prognosis but insists that he can breath and wished for extubation trial.  Patient was extubated and was placed on BIPAP however was unable to breathe due to severe hypoxemia and tachypnea and was re-intubated.  Patient with very poor prognosis, I recommend comfort care measures at this time. Palliative care consultation in process.  1/5 remains on vent, multiorgan failure  Consults:  PCCM  Cardiology Nephrology Neurology  Procedures:  12/29: Endotracheal intubation 12/29: Left femoral CVC placed 12/29: Left femoral A-line  placed>> out 01/02  Significant Diagnostic Tests:  12/29: Chest x-ray>>Portable AP supine view at 0718 hours. Intubated. Endotracheal tube tip in good position between the level the clavicles and carina. Enteric tube courses to the abdomen, tip not included. Partially visible gas in the stomach. Resuscitation pads project over the lower chest. Somewhat low lung volumes. Mediastinal contours remain within normal limits. Allowing for portable technique the lungs are clear. No osseous abnormality identified. 12/29: Abdominal X-ray>>Enteric tube terminates in the stomach, side hole at the gastric fundus. Moderate gastric distension. 12/29: CT Head w/o Contrast>>Mild hypodensity in the frontal white matter right greater than left, not present in 2017. Nonspecific appearance. Possible chronic ischemia however given the patient's age consider MRI of the brain without and with contrast for further evaluation 12/29: 2D Echocardiogram>>1. Left ventricular ejection fraction, by estimation, is 20 to 25%. The  left ventricle has severely decreased function. The left ventricle  demonstrates global hypokinesis. There is mild left ventricular  hypertrophy. Left ventricular diastolic parameters  are consistent with Grade I diastolic dysfunction (impaired relaxation).  2. Right ventricular systolic function is severely reduced. The right  ventricular size is normal.  3. The mitral valve is normal in structure. No evidence of mitral valve  regurgitation.  4. The aortic valve is normal in structure. Aortic valve regurgitation is  trivial.  5. Aortic dilatation noted. There is mild dilatation of the aortic root,  measuring 42 mm.  12/29: EEG>>This study is suggestive of profoud diffuse encephalopathy, nonspecific etiology but likely related to sedation, anoxic/hypoxic brain injury. No seizures or epileptiform discharges were seen throughout the recording. 12/31: MRI Brain>>1. Acute infarct of the left  central Pons with superimposed numerous pontine micro-hemorrhages. Trace cytotoxic edema at the lacunar infarct site, but no pontine vasogenic edema or mass effect to suggest any of these micro-bleeds are acute. 2. There is also a punctate cortical infarct at the superior right motor strip, without hemorrhage or mass effect. 3. Numerous chronic micro-hemorrhages throughout the lower brainstem. Lesser involvement of the bilateral cerebellum, right thalamus. Chronic ischemia in the right frontal lobe white matter corresponding to the recent CT finding. 12/31: EEG>>This study issuggestive ofsevere diffuse encephalopathy, nonspecific etiology but likely related to sedation, anoxic/hypoxic brain injury.No seizures or epileptiform discharges were seen throughout the recording. EEG appears to be improving compared to previous study on 03/02/2020 03/17/2020: No significant change on CT head  03/16/2020: Chest x-ray   Micro Data:  12/29: SARS-CoV-2 PCR>>negative 12/29: Influenza PCR>>negative 12/29: Blood culture x2>> negative 12/29: Urine>> negative 12/29: Tracheal aspirate>> GPC's, pending  Antimicrobials:  N/A    CC Follow up resp failure  HPI End stage CHF Acute resp failure TTM  Brain damage from strokes Failed extubation   Objective   Blood pressure 127/64, pulse 69, temperature 99.68 F (37.6 C), resp. rate 18, height _0  (1.702 m), weight 106.2 kg, SpO2 97 %.    Vent Mode: PRVC FiO2 (%):  [40 %-100 %] 40 % Set Rate:  [18 bmp] 18 bmp Vt Set:  [500 mL] 500 mL PEEP:  [5 cmH20-10 cmH20] 10 cmH20 Pressure Support:  [5 cmH20-10 cmH20] 5 cmH20 Plateau Pressure:  [4 cmH20] 4 cmH20   Intake/Output Summary (Last 24 hours) at 03/22/2020 0741 Last data filed at 22-Mar-2020 0600 Gross per 24 hour  Intake 3160.32 ml  Output 5975 ml  Net -2814.68 ml  Filed Weights   03/14/2020 1027  Weight: 106.2 kg   REVIEW OF SYSTEMS  PATIENT IS UNABLE TO PROVIDE COMPLETE REVIEW OF  SYSTEM S DUE TO SEVERE CRITICAL ILLNESS AND ENCEPHALOPATHY    PHYSICAL EXAMINATION:  GENERAL:critically ill appearing, +resp distress HEAD: Normocephalic, atraumatic.  EYES: Pupils equal, round, reactive to light.  No scleral icterus.  MOUTH: Moist mucosal membrane. NECK: Supple. No thyromegaly. No nodules. No JVD.  PULMONARY: +rhonchi, +wheezing CARDIOVASCULAR: S1 and S2. Regular rate and rhythm. No murmurs, rubs, or gallops.  GASTROINTESTINAL: Soft, nontender, -distended. Positive bowel sounds.  MUSCULOSKELETAL: No swelling, clubbing, or edema.  NEUROLOGIC: obtunded [facility]    Resolved Hospital Problem list   N/A  Assessment & Plan:   Acute Fib Cardiac arrest Cardiogenic Shock Cardiomyopathy, Biventricular failure (EF 25% on Echo 02/18/2020) Paroxsymal Atrial Fibrillation   Severe ACUTE Hypoxic and Hypercapnic Respiratory Failure -continue Mechanical Ventilator support -continue Bronchodilator Therapy -Wean Fio2 and PEEP as tolerated -VAP/VENT bundle implementation Patient will need Trach      Acute Hypoxic Respiratory Failure in the setting of Cardiac Arrest Suspected Aspiration Pneumonitis  ,ACUTE KIDNEY INJURY/Renal Failure -continue Foley Catheter-assess need -Avoid nephrotoxic agents -Follow urine output, BMP -Ensure adequate renal perfusion, optimize oxygenation -Renal dose medications   Unresponsive s/p cardiac arrest 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 CT edema MRI Brain w/ Acute Infarct of the left central Pons Numerous pontine micro-hemorrhages (chronic) Poor prognosis    ELECTROLYTES -follow labs as needed -replace as needed -pharmacy consultation and following   GI GI PROPHYLAXIS as indicated  NUTRITIONAL STATUS DIET-->TF's as tolerated Constipation protocol as indicated    DVT/GI PRX ordered and assessed TRANSFUSIONS AS NEEDED MONITOR FSBS I Assessed the need for Labs I Assessed the need for Foley I Assessed the  need for Central Venous Line Family Discussion when available I Assessed the need for Mobilization I made an Assessment of medications to be adjusted accordingly Safety Risk assessment completed  CASE DISCUSSED IN MULTIDISCIPLINARY ROUNDS WITH ICU TEAM    Critical Care Time devoted to patient care services described in this note is 45 minutes.   Overall, patient is critically ill, prognosis is guarded.  Patient with Multiorgan failure and at high risk for cardiac arrest and death.    Corrin Parker, M.D.  [facility]  Medical Director [place] Director [facility] Cardio-Pulmonary Department

## 2020-04-16 NOTE — Progress Notes (Signed)
Pt. Extubated to room air. 

## 2020-04-16 NOTE — Progress Notes (Signed)
GOALS OF CARE DISCUSSION  The Clinical status was relayed to family in detail. Wife at bedside  Updated and notified of patients medical condition.   patient with increased WOB and using accessory muscles to breathe, failed trial of extubation Explained to family course of therapy and the modalities     Patient with Progressive multiorgan failure with high probability of  low chance of meaningful recovery despite all aggressive and optimal medical therapy.   Family understands the situation.  Remains Full Code at this time  Family are satisfied with Plan of action and management. All questions answered  Additional CC time 32 mins   Francenia Chimenti Patricia Pesa, M.D.  [facility]  Medical Director [place] Director [facility] Cardio-Pulmonary Department

## 2020-04-16 NOTE — Death Summary Note (Signed)
DEATH SUMMARY   Patient Details  Name: Jeremiah Keith MRN: [id number] DOB: June 27, 1985  Admission/Discharge Information   Admit Date:  04-05-20  Date of Death:   04-12-20   Time of Death:  05-08-21  Length of Stay: 7  Referring Physician: [name], MD   Reason(s) for Hospitalization  Acute cardiac arrest Ischemic cardiomyopathy  Diagnoses  Preliminary cause of death: Ischemic Cardiomyopathy  Secondary Diagnoses (including complications and co-morbidities):  Active Problems:   Cardiac arrhythmia   Biventricular failure (HCC)   Paroxysmal atrial fibrillation (HCC)   Cardiac arrest (HCC)   Acute respiratory failure with hypoxia (HCC)   Aspiration into airway   35 y.o. Male with a PMH significant for Paroxsymal Atrial fibrillation, Hypertension, and CKD admitted s/p out of hospital cardiac arrest (V-fib).  Being placed on Targeted Temperature Management @ 36 C. 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  Patient profile:  Jeremiah Keith is a 35 year old male with a past medical history significant for paroxysmal atrial fibrillation on Eliquis for 4 weeks, chronic kidney disease, and hypertension who presented to [facility] ED on 04/05/20 following an out of hospital V. fib cardiac arrest.  Apparently he was scheduled to have a cardioversion done today and in route to the appointment his father noted he became unresponsive in the car where he subsequently pulled into an EMS station.  CPR was started, and he received defibrillation x3 for reported V. fib arrest with ROSC obtained.  Estimated time of collapse to CPR is 4 minutes, with ROSC obtained after 6 minutes 43 seconds.  ED course: Upon arrival to the ED he was emergently intubated.  He remained unresponsive therefore he was placed on targeted temperature management at 36 C.  Cardiology was consulted and recommended placing on amiodarone drip.  Initial  work-up shows bicarb 21, BUN 29, creatinine 2.96, glucose 263, BNP 229.5, high-sensitivity troponin 22, lactic acid 5.7, WBC 17.2 with neutrophilia, and procalcitonin is negative.  Chest x-ray is negative for any acute cardiopulmonary process.  His SARS-CoV-2 PCR and influenza PCR both negative.  Urinalysis is not consistent with UTI.  Urine drug screen is positive for cannabinoid.  CT head obtained which showed mild hypodensity in the frontal white matter (right greater than left) which is nonspecific in appearance, along with possible chronic ischemia.  Post admission to ICU he is now hypotensive requiring vasopressors.  Echocardiogram performed revealing Cardiomyopathy and biventricular failure with EF of 20 to 25%.  Central venous access and arterial line placed.  Patient underwent targeted hypothermia protocol.  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  Reviewed hospital course and findings - overall very poor prognosis - recommed comfort measures due to advanced CHF and multiple CVA 03/20/19- patient was showing some signs of clinical improvement (mouthing words and following verbal communication, he did have weakness in LUE and LLE but did pass SBT today, I met with mother of patient and discussed high risk  extubation due to Advanced CHF with CVA.  Mother understood that patient is with poor prognosis but insists that he can breath and  wished for extubation trial.  Patient was extubated and was placed on BIPAP however was unable to breathe due to severe hypoxemia and tachypnea and was re-intubated.  Patient with very poor prognosis, I recommend comfort care measures at this time. Palliative care consultation in process.  1/5 remains on vent, multiorgan failure  Consults:  PCCM  Cardiology Nephrology Neurology  Procedures:  12/29: Endotracheal intubation 12/29: Left femoral CVC placed 12/29: Left femoral A-line placed>> out 01/02  Significant Diagnostic Tests:  12/29: Chest x-ray>>Portable AP supine view at 0718 hours. Intubated. Endotracheal tube tip in good position between the level the clavicles and carina. Enteric tube courses to the abdomen, tip not included. Partially visible gas in the stomach. Resuscitation pads project over the lower chest. Somewhat low lung volumes. Mediastinal contours remain within normal limits. Allowing for portable technique the lungs are clear. No osseous abnormality identified. 12/29: Abdominal X-ray>>Enteric tube terminates in the stomach, side hole at the gastric fundus. Moderate gastric distension. 12/29: CT Head w/o Contrast>>Mild hypodensity in the frontal white matter right greater than left, not present in 2017. Nonspecific appearance. Possible chronic ischemia however given the patient's age consider MRI of the brain without and with contrast for further evaluation 12/29: 2D Echocardiogram>>1. Left ventricular ejection fraction, by estimation, is 20 to 25%. The  left ventricle has severely decreased function. The left ventricle  demonstrates global hypokinesis. There is mild left ventricular  hypertrophy. Left ventricular diastolic parameters  are consistent with Grade I diastolic dysfunction (impaired relaxation).  2. Right ventricular systolic function is severely reduced. The right  ventricular size is normal.  3. The mitral valve is normal in structure. No  evidence of mitral valve  regurgitation.  4. The aortic valve is normal in structure. Aortic valve regurgitation is  trivial.  5. Aortic dilatation noted. There is mild dilatation of the aortic root,  measuring 42 mm.  12/29: EEG>>This study issuggestive ofprofouddiffuse encephalopathy, nonspecific etiology but likely related to sedation, anoxic/hypoxic brain injury.No seizures or epileptiform discharges were seen throughout the recording. 12/31: MRI Brain>>1. 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 Trace cytotoxic edema at the lacunar infarct site, but no pontine vasogenic edema or mass effect to suggest any of these micro-bleeds are acute. 2. There is also a punctate cortical infarct at the superior right motor strip, without hemorrhage or mass effect. 3. Numerous chronic micro-hemorrhages throughout the lower brainstem. Lesser involvement of the bilateral cerebellum, right thalamus. Chronic ischemia in the right frontal lobe white matter corresponding to the recent CT finding. 12/31: EEG>>This study issuggestive ofseverediffuse encephalopathy, nonspecific etiology but likely related to sedation, anoxic/hypoxic brain injury.No seizures or epileptiform discharges were seen throughout the recording. EEG appears to be improving compared to previous study on 03/05/2020 03/17/2020: No significant change on CT head   GOALS OF CARE DISCUSSION  The Clinical status was relayed to family in detail.  Updated and notified of patients medical condition.  Patient remains unresponsive and will not open eyes to command.   Upon assessment his breath sounds are course crackles with significant secretions to oral pharyngeal region.     patient with increased WOB and using accessory muscles to breathe Explained to family course of therapy and the modalities     Patient with Progressive multiorgan failure with very low chance of  meaningful recovery despite all aggressive  and optimal medical therapy. Patient is in the Dying  Process associated with Suffering.  Family understands the situation.  They have consented and agreed to DNR/DNI and would like to proceed with Comfort care measures.  Family are satisfied with Plan of action and management. All questions answered      Pertinent Labs and Studies  Significant Diagnostic Studies EEG  Result Date: 03/15/2020 Lora Havens, MD     03/15/2020  3:28 PM Patient Name: Jeremiah Keith MRN: [id number] Epilepsy Attending: Lora Havens Referring Physician/Provider: Marda Stalker, NP Date: 03/15/2020 Duration: 25.05 mins  Patient history: 35yo M s/p cardiac arrest. EEG to evaluate for seizure  Level of alertness: comatose  AEDs during EEG study: Propofol  Technical aspects: This EEG study was done with scalp electrodes positioned according to the 10-20 International system of electrode placement. Electrical activity was acquired at a sampling rate of 500Hz and reviewed with a high frequency filter of 70Hz and a low frequency filter of 1Hz. EEG data were recorded continuously and digitally stored.  Description: EEG showed continuous generalized 2-3Hz delta slowing as well as intermittent generalized 15-18Hz beta activity. EEG was not reactive to tactile stimulation. Hyperventilation and photic stimulation were not performed.    ABNORMALITY -Continuous slow, generalized  IMPRESSION: This study is suggestive of severe diffuse encephalopathy, nonspecific etiology but likely related to sedation, anoxic/hypoxic brain injury. No seizures or epileptiform discharges were seen throughout the recording. EEG appears to be improving compared to previous study on 03/09/2020  Lora Havens   DG Abd 1 View  Result Date: 03/16/2020 CLINICAL DATA:  OG tube placed EXAM: ABDOMEN - 1 VIEW COMPARISON:  None. FINDINGS: The bowel gas pattern is normal. Tip of the OG tube seen  within the stomach. No radio-opaque calculi or other significant radiographic abnormality are seen. IMPRESSION: Tip the NG tube in the stomach. Electronically Signed   By: Prudencio Pair M.D.   On: 03/16/2020 22:39   DG Abdomen 1 View  Result Date: 02/29/2020 CLINICAL DATA:  35 year old male status post cardiac arrest and resuscitation. EXAM: ABDOMEN - 1 VIEW COMPARISON:  Portable chest from the same time today and earlier. FINDINGS: Portable AP supine view at 0719 hours. Enteric tube terminates in the stomach with side hole at the level of the gastric fundus. Moderate gas distention of the stomach. Other visible bowel gas pattern appears normal. Mild motion artifact at the lung bases which appear negative. No osseous abnormality identified. IMPRESSION: Enteric tube terminates in the stomach, side hole at the gastric fundus. Moderate gastric distension. Electronically Signed   By: Genevie Ann M.D.   On: 02/22/2020 07:55   CT HEAD WO CONTRAST  Result Date: 03/17/2020 CLINICAL DATA:  35 year old male status post CPR. Worsening mental status. 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 EXAM: CT HEAD WITHOUT CONTRAST TECHNIQUE: Contiguous axial images were obtained from the base of the skull through the vertex without intravenous contrast. COMPARISON:  Brain MRI 03/15/2020 and earlier. FINDINGS: Brain: Inconspicuous gray-white matter differentiation throughout the brain, although not significantly changed from the head CT 02/21/2020. And bilateral sulci do not appear to have changed. Ventricle size is stable and within normal limits. Increased hypodensity in the left pons corresponding to known infarct. No acute intracranial hemorrhage identified. No new cortically based infarct identified. No intracranial mass effect. Normal basilar cisterns. Vascular: No suspicious intracranial vascular hyperdensity. Skull: No acute osseous abnormality identified. Sinuses/Orbits: Mild sinus opacification in the  setting of  intubation. Tympanic cavities and mastoids remain clear. Other: No acute orbit or scalp soft tissue finding. Fluid in the pharynx in the setting of intubation. IMPRESSION: 1. No evidence of anoxic injury by CT. Inconspicuous gray-white matter differentiation throughout the brain, but stable from CT 03/10/2020 that predated the MRI (03/15/2020) which was negative for diffuse anoxic injury. 2. 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 No intracranial hemorrhage or mass effect. Electronically Signed   By: Genevie Ann M.D.   On: 03/17/2020 11:19   CT Head Wo Contrast  Result Date: 03/15/2020 CLINICAL DATA:  Altered mental status. EXAM: CT HEAD WITHOUT CONTRAST TECHNIQUE: Contiguous axial images were obtained from the base of the skull through the vertex without intravenous contrast. COMPARISON:  CT head 12/08/2015 FINDINGS: Brain: No evidence of acute infarction, hemorrhage, hydrocephalus, extra-axial collection or mass lesion/mass effect. Mild hypodensity in the cerebral white matter right greater than left without mass-effect. Vascular: Negative for hyperdense vessel Skull: Negative Sinuses/Orbits: Paranasal sinuses clear.  Negative orbit Other: None IMPRESSION: Mild hypodensity in the frontal white matter right greater than left, not present in 2017. Nonspecific appearance. Possible chronic ischemia however given the patient's age consider MRI of the brain without and with contrast for further evaluation Electronically Signed   By: Franchot Gallo M.D.   On: 03/11/2020 08:12   MR ANGIO HEAD WO CONTRAST  Result Date: 03/15/2020 CLINICAL DATA:  Stroke follow-up EXAM: MRA HEAD WITHOUT CONTRAST TECHNIQUE: Angiographic images of the Circle of Willis were obtained using MRA technique without intravenous contrast. COMPARISON:  None. FINDINGS: POSTERIOR CIRCULATION: --Vertebral arteries: Normal --Inferior cerebellar arteries: Normal. --Basilar artery: Normal. --Superior cerebellar  arteries: Normal. --Posterior cerebral arteries: Normal. ANTERIOR CIRCULATION: --Intracranial internal carotid arteries: Normal. --Anterior cerebral arteries (ACA): Normal. --Middle cerebral arteries (MCA): Normal. ANATOMIC VARIANTS: None IMPRESSION: Normal intracranial MRA. Electronically Signed   By: Ulyses Jarred M.D.   On: 03/15/2020 23:11   MR BRAIN W WO CONTRAST  Result Date: 03/15/2020 CLINICAL DATA:  35 year old male with encephalopathy. History of paroxysmal atrial fibrillation on Eliquis. Abnormal white matter on head CT recently. EXAM: MRI HEAD WITHOUT AND WITH CONTRAST TECHNIQUE: Multiplanar, multiecho pulse sequences of the brain and surrounding structures were obtained without and with intravenous contrast. CONTRAST:  7m GADAVIST GADOBUTROL 1 MMOL/ML IV SOLN COMPARISON:  Head CT 03/06/2020 and earlier. FINDINGS: Brain: Punctate restricted diffusion at the superior right motor strip with faint T2 and FLAIR hyperintensity (series 5, image 45). Larger 6 mm area of restricted diffusion in the central left pons (series 5, image 16 and series 6, image 16). T2 and FLAIR hyperintensity as well as numerous regional microhemorrhages (series 13, image 21), 1 of which could be associated with the acute lesion. But no pontine edema or mass effect. No other diffusion restriction. Numerous chronic microhemorrhages in the lower brainstem and bilateral cerebellum. Occasional supratentorial chronic micro hemorrhages (right thalamus series 13, image 29). Chronic T2 and FLAIR hyperintensity in the right centrum semiovale tracking to the corona radiata corresponding to the CT finding. No chronic cortical encephalomalacia identified. No abnormal enhancement identified. No dural thickening. No midline shift, mass effect, evidence of mass lesion, ventriculomegaly, extra-axial collection or acute intracranial hemorrhage. Cervicomedullary junction and pituitary are within normal limits. Vascular: Major intracranial  vascular flow voids are preserved. The major dural venous sinuses are enhancing and appear to be patent. Skull and upper cervical spine: Negative. Sinuses/Orbits: Negative orbits. Scattered paranasal sinus mucosal thickening. Other: Intubated with fluid in the visible pharynx. Superimposed  midline pharyngeal retention cysts suspected with rim enhancement (series 10, image 4 and series 18, image 20). Otherwise negative visible pharynx. Trace mastoid air cell fluid. Grossly normal visible internal auditory structures. IMPRESSION: 1. 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 Trace cytotoxic edema at the lacunar infarct site, but no pontine vasogenic edema or mass effect to suggest any of these micro-bleeds are acute. 2. There is also a punctate cortical infarct at the superior right motor strip, without hemorrhage or mass effect. 3. Numerous chronic micro-hemorrhages throughout the lower brainstem. Lesser involvement of the bilateral cerebellum, right thalamus. Chronic ischemia in the right frontal lobe white matter corresponding to the recent CT finding. Electronically Signed   By: Genevie Ann M.D.   On: 03/15/2020 13:41   DG CHEST PORT 1 VIEW  Result Date: 03-27-20 CLINICAL DATA:  CHF. EXAM: PORTABLE CHEST 1 VIEW COMPARISON:  03/19/2020. FINDINGS: Endotracheal tube and NG tube stable position. Stable cardiomegaly. Interstitial infiltrates/edema again noted. Interim slight improvement from prior exam. Lung volumes with bilateral subsegmental atelectasis. Tiny bilateral pleural effusions cannot be excluded. No pneumothorax. IMPRESSION: 1. Lines and tubes stable position. 2. Stable cardiomegaly. Bilateral interstitial infiltrates/edema again noted. Interim improvement from prior exam. 3. Low lung volumes with bilateral subsegmental atelectasis. Electronically Signed   By: Marcello Moores  Register   On: 03-27-20 08:41   DG Chest Port 1 View  Result Date:  03/19/2020 CLINICAL DATA:  Intubation.  OG tube placement. EXAM: PORTABLE CHEST 1 VIEW COMPARISON:  Chest x-ray 03/18/2020. FINDINGS: Endotracheal tube and NG tube in stable position. Stable cardiomegaly. Low lung volumes. Diffuse bilateral pulmonary infiltrates/edema noted on today's exam. No pleural effusion or pneumothorax no acute bony abnormality. IMPRESSION: 1. Endotracheal tube and NG tube in stable position. 2. Stable cardiomegaly. 3. Low lung volumes. Diffuse bilateral pulmonary infiltrates/edema noted on today's exam. Electronically Signed   By: Marcello Moores  Register   On: 03/19/2020 13:55   DG Chest Port 1 View  Result Date: 03/18/2020 CLINICAL DATA:  Acute respiratory failure. EXAM: PORTABLE CHEST 1 VIEW COMPARISON:  03/16/2020 FINDINGS: 0520 hours. Endotracheal tube tip is 7.1 cm above the base of the carina. The NG tube passes into the stomach although the distal tip position is not included on the film. Low lung volumes. Retrocardiac collapse/consolidation is progressive in the interval with probable small left pleural effusion. There is pulmonary vascular congestion without overt pulmonary edema. Telemetry leads overlie the chest. IMPRESSION: 1. Endotracheal tube tip is 7.1 cm above the base of the carina. 2. Worsening retrocardiac collapse/consolidation with probable small left pleural effusion. Electronically Signed   By: Misty Stanley M.D.   On: 03/18/2020 06:15   DG Chest Port 1 View  Result Date: 03/16/2020 CLINICAL DATA:  Concern for aspiration EXAM: PORTABLE CHEST 1 VIEW COMPARISON:  Prior chest x-ray 03/14/2020 FINDINGS: The patient is intubated. The tip of the endotracheal tube is 3.5 cm above the carina. A gastric tube is present, the tip lies off the field of view. External defibrillator pads have been removed. Stable cardiac and mediastinal contours. Inspiratory volumes are low. No new airspace opacities to suggest aspiration. No pleural effusion or pneumothorax. No acute osseous  abnormality. IMPRESSION: 1. Low inspiratory volumes without evidence of new airspace opacity to suggest aspiration. 2. Support apparatus as above. Electronically Signed   By: Jacqulynn Cadet M.D.   On: 03/16/2020 10:33   DG Chest Port 1 View  Result Date: 03/14/2020 CLINICAL DATA:  Acute respiratory failure EXAM: PORTABLE  CHEST 1 VIEW COMPARISON:  02/26/2020 FINDINGS: Endotracheal tube seen 5.6 cm above the carina. Nasogastric tube extends into the left upper quadrant of the abdomen overlying the proximal body of the stomach. The lungs are symmetrically well expanded and are clear. No pneumothorax or pleural effusion. Cardiac size within normal limits. Pulmonary vascularity is normal. No acute bone abnormality. IMPRESSION: Stable examination. Stable support tubes. No focal pulmonary infiltrate. Electronically Signed   By: Fidela Salisbury MD   On: 03/14/2020 06:00   DG Chest Port 1 View  Result Date: 03/10/2020 CLINICAL DATA:  Acute respiratory failure with hypoxia. EXAM: PORTABLE CHEST 1 VIEW COMPARISON:  Earlier film, same date. FINDINGS: The endotracheal tube is now 2.4 cm above the carina. The NG tube is coursing down the esophagus and into the stomach. External pacer paddles are noted. The heart is within normal limits in size given the AP projection, portable technique and supine position of the patient. The lungs are clear of an acute process. No pleural effusions or pneumothorax. IMPRESSION: 1. The endotracheal tube is now 2.4 cm above the carina. 2. No acute cardiopulmonary findings. Electronically Signed   By: Marijo Sanes M.D.   On: 02/17/2020 15:11   DG Chest Port 1 View  Result Date: 03/03/2020 CLINICAL DATA:  Encounter for intubation. EXAM: PORTABLE CHEST 1 VIEW COMPARISON:  Radiographs 03/10/2020 and 02/09/2020.  CT 04/12/2014. FINDINGS: 1051 hours. Tip of the endotracheal tube is just below the thoracic inlet, approximately 6.5 cm above the carina. Enteric tube is in place, not well  visualized, but projecting below the diaphragm. The heart size and mediastinal contours are stable. The lungs are clear. There is no pleural effusion or pneumothorax. No acute osseous findings are evident. IMPRESSION: Endotracheal tube tip just below the thoracic inlet. Enteric tube extends below the diaphragm. No acute cardiopulmonary process. Electronically Signed   By: Richardean Sale M.D.   On: 03/15/2020 11:19   DG Chest Port 1 View  Result Date: 03/06/2020 CLINICAL DATA:  35 year old male status post cardiac arrest and resuscitation. Query sepsis. EXAM: PORTABLE CHEST 1 VIEW COMPARISON:  02/09/2020 chest radiographs and earlier. FINDINGS: Portable AP supine view at 0718 hours. Intubated. Endotracheal tube tip in good position between the level the clavicles and carina. Enteric tube courses to the abdomen, tip not included. Partially visible gas in the stomach. Resuscitation pads project over the lower chest. Somewhat low lung volumes. Mediastinal contours remain within normal limits. Allowing for portable technique the lungs are clear. No osseous abnormality identified. IMPRESSION: 1. ET tube and enteric tube appear satisfactory. 2. Low lung volumes with no acute cardiopulmonary abnormality. Electronically Signed   By: Genevie Ann M.D.   On: 03/12/2020 07:54   DG Abd Portable 1V  Result Date: 03/19/2020 CLINICAL DATA:  Status post NG tube placement. EXAM: PORTABLE ABDOMEN - 1 VIEW COMPARISON:  Single-view of the abdomen 03/16/2020. FINDINGS: NG tube is in place with the tip and side-port in the stomach. IMPRESSION: NG tube in good position. Electronically Signed   By: Inge Rise M.D.   On: 03/19/2020 13:59   EEG adult  Result Date: 02/24/2020 Lora Havens, MD     02/16/2020  5:17 PM Patient Name: Jeremiah Keith MRN: [id number] Epilepsy Attending: Lora Havens Referring Physician/Provider: Dr [name], NP Date: 03/15/2020 Duration: 25.05 mins Patient history: 35yo M s/p  cardiac arrest. EEG to evaluate for seizure Level of alertness: comatose AEDs during EEG study: Propofol Technical aspects: This EEG study was done  with scalp electrodes positioned according to the 10-20 International system of electrode placement. Electrical activity was acquired at a sampling rate of 500Hz and reviewed with a high frequency filter of 70Hz and a low frequency filter of 1Hz. EEG data were recorded continuously and digitally stored. Description: EEG showed burst suppression with bursts of generalized low amplitude 3-5hz theta-delta slowing lasting 3-5seconds and generalized suppression lasting 4-6 seconds. EEG was not reactive to tactile stimulation. Hyperventilation and photic stimulation were not performed.   ABNORMALITY -Burst suppression, generalized IMPRESSION: This study is suggestive of profoud diffuse encephalopathy, nonspecific etiology but likely related to sedation, anoxic/hypoxic brain injury. No seizures or epileptiform discharges were seen throughout the recording. Lora Havens   ECHOCARDIOGRAM COMPLETE  Result Date: 03/06/2020    ECHOCARDIOGRAM REPORT   Patient Name:   Jeremiah Keith Date of Exam: 03/04/2020 Medical Rec #:  [id number]             Height:       67.0 in Accession #:    [id number]            Weight:       222.0 lb Date of Birth:  1985-08-31            BSA:          2.114 m Patient Age:    29 years              BP:           93/80 mmHg Patient Gender: M                     HR:           61 bpm. Exam Location:  [facility] Procedure: 2D Echo, Color Doppler, Cardiac Doppler and Intracardiac            Opacification Agent STAT ECHO Indications:     I46.9 Cardiac arrest  History:         Patient has prior history of Echocardiogram examinations, most                  recent 02/09/2020. CKD, Arrythmias:Atrial Fibrillation; Risk                  Factors:Hypertension.  Sonographer:     Charmayne Sheer RDCS (AE) Referring Phys:  [id number] [name] Diagnosing Phys: [name] MD  Sonographer Comments: Suboptimal apical window and echo performed with patient supine and on artificial respirator. Image acquisition challenging due to patient body habitus. IMPRESSIONS  1. Left ventricular ejection fraction, by estimation, is 20 to 25%. The left ventricle has severely decreased function. The left ventricle demonstrates global hypokinesis. There is mild left ventricular hypertrophy. Left ventricular diastolic parameters  are consistent with Grade I diastolic dysfunction (impaired relaxation).  2. Right ventricular systolic function is severely reduced. The right ventricular size is normal.  3. The mitral valve is normal in structure. No evidence of mitral valve regurgitation.  4. The aortic valve is normal in structure. Aortic valve regurgitation is trivial.  5. Aortic dilatation noted. There is mild dilatation of the aortic root, measuring 42 mm. FINDINGS  Left Ventricle: Left ventricular ejection fraction, by estimation, is 20 to 25%. The left ventricle has severely decreased function. The left ventricle demonstrates global hypokinesis. Definity contrast agent was given IV to delineate the left ventricular endocardial borders. The left ventricular internal cavity size was normal in size. There is mild left ventricular hypertrophy. Left  ventricular diastolic parameters are consistent with Grade I diastolic dysfunction (impaired relaxation). Right Ventricle: The right ventricular size is normal. No increase in right ventricular wall thickness. Right ventricular systolic function is severely reduced. Left Atrium: Left atrial size was normal in size. Right Atrium: Right atrial size was normal in size. Pericardium: There is no evidence of pericardial effusion. Mitral Valve: The mitral valve is normal in structure. No evidence of mitral valve regurgitation. MV peak gradient, 1.6 mmHg. The mean mitral valve gradient is 1.0 mmHg. Tricuspid Valve: The tricuspid valve is normal in  structure. Tricuspid valve regurgitation is not demonstrated. Aortic Valve: The aortic valve is normal in structure. Aortic valve regurgitation is trivial. Aortic valve mean gradient measures 1.0 mmHg. Aortic valve peak gradient measures 2.5 mmHg. Aortic valve area, by VTI measures 2.44 cm. Pulmonic Valve: The pulmonic valve was normal in structure. Pulmonic valve regurgitation is not visualized. Aorta: Aortic dilatation noted. There is mild dilatation of the aortic root, measuring 42 mm. Venous: IVC assessment for right atrial pressure unable to be performed due to mechanical ventilation. IAS/Shunts: No atrial level shunt detected by color flow Doppler.  LEFT VENTRICLE PLAX 2D LVIDd:         6.26 cm      Diastology LVIDs:         5.11 cm      LV e' medial:    5.66 cm/s LV PW:         1.28 cm      LV E/e' medial:  8.6 LV IVS:        0.93 cm      LV e' lateral:   4.68 cm/s LVOT diam:     2.60 cm      LV E/e' lateral: 10.4 LV SV:         34 LV SV Index:   16 LVOT Area:     5.31 cm  LV Volumes (MOD) LV vol d, MOD A2C: 91.7 ml LV vol d, MOD A4C: 128.0 ml LV vol s, MOD A2C: 62.0 ml LV vol s, MOD A4C: 76.8 ml LV SV MOD A2C:     29.7 ml LV SV MOD A4C:     128.0 ml LV SV MOD BP:      39.1 ml RIGHT VENTRICLE RV Basal diam:  3.80 cm LEFT ATRIUM             Index       RIGHT ATRIUM           Index LA diam:        3.20 cm 1.51 cm/m  RA Area:     16.70 cm LA Vol (A2C):   62.5 ml 29.57 ml/m RA Volume:   45.20 ml  21.38 ml/m LA Vol (A4C):   34.6 ml 16.37 ml/m LA Biplane Vol: 47.0 ml 22.23 ml/m  AORTIC VALVE                   PULMONIC VALVE AV Area (Vmax):    3.30 cm    PV Vmax:       0.42 m/s AV Area (Vmean):   3.25 cm    PV Vmean:      32.300 cm/s AV Area (VTI):     2.44 cm    PV VTI:        0.074 m AV Vmax:           79.70 cm/s  PV Peak grad:  0.7 mmHg AV Vmean:  52.100 cm/s PV Mean grad:  0.0 mmHg AV VTI:            0.139 m AV Peak Grad:      2.5 mmHg AV Mean Grad:      1.0 mmHg LVOT Vmax:         49.60 cm/s  LVOT Vmean:        31.900 cm/s LVOT VTI:          0.064 m LVOT/AV VTI ratio: 0.46  AORTA Ao Root diam: 4.20 cm MITRAL VALVE               TRICUSPID VALVE MV Area (PHT): 3.26 cm    TR Peak grad:   16.6 mmHg MV Peak grad:  1.6 mmHg    TR Vmax:        204.00 cm/s MV Mean grad:  1.0 mmHg MV Vmax:       0.64 m/s    SHUNTS MV Vmean:      37.1 cm/s   Systemic VTI:  0.06 m MV Decel Time: 233 msec    Systemic Diam: 2.60 cm MV E velocity: 48.80 cm/s MV A velocity: 29.60 cm/s MV E/A ratio:  1.65 [name] MD Electronically signed by [name] MD Signature Date/Time: 02/15/2020/12:58:31 PM    Final     Microbiology Recent Results (from the past 240 hour(s))  SARS CORONAVIRUS 2 (TAT 6-24 HRS) Nasopharyngeal Nasopharyngeal Swab     Status: None   Collection Time: 03/11/20 11:30 AM   Specimen: Nasopharyngeal Swab  Result Value Ref Range Status   SARS Coronavirus 2 NEGATIVE NEGATIVE Final    Comment: (NOTE) SARS-CoV-2 target nucleic acids are NOT DETECTED.  The SARS-CoV-2 RNA is generally detectable in upper and lower respiratory specimens during the acute phase of infection. Negative results do not preclude SARS-CoV-2 infection, do not rule out co-infections with other pathogens, and should not be used as the sole basis for treatment or other patient management decisions. Negative results must be combined with clinical observations, patient history, and epidemiological information. The expected result is Negative.  Fact Sheet for Patients: SugarRoll.be  Fact Sheet for Healthcare Providers: https://www.woods-mathews.com/  This test is not yet approved or cleared by the [place] FDA and  has been authorized for detection and/or diagnosis of SARS-CoV-2 by FDA under an Emergency Use Authorization (EUA). This EUA will remain  in effect (meaning this test can be used) for the duration of the COVID-19 declaration under Se ction 564(b)(1) of the  Act, 21 U.S.C. section 360bbb-3(b)(1), unless the authorization is terminated or revoked sooner.  Performed at [facility], [place] [address] [place], [place] [address]   Resp Panel by RT-PCR (Flu A&B, Covid) Nasopharyngeal Swab     Status: None   Collection Time: 02/24/2020  7:00 AM   Specimen: Nasopharyngeal Swab; Nasopharyngeal(NP) swabs in vial transport medium  Result Value Ref Range Status   SARS Coronavirus 2 by RT PCR NEGATIVE NEGATIVE Final    Comment: (NOTE) SARS-CoV-2 target nucleic acids are NOT DETECTED.  The SARS-CoV-2 RNA is generally detectable in upper respiratory specimens during the acute phase of infection. The lowest concentration of SARS-CoV-2 viral copies this assay can detect is 138 copies/mL. A negative result does not preclude SARS-Cov-2 infection and should not be used as the sole basis for treatment or other patient management decisions. A negative result may occur with  improper specimen collection/handling, submission of specimen other than nasopharyngeal swab, presence of viral mutation(s) within  the areas targeted by this assay, and inadequate number of viral copies(<138 copies/mL). A negative result must be combined with clinical observations, patient history, and epidemiological information. The expected result is Negative.  Fact Sheet for Patients:  EntrepreneurPulse.com.au  Fact Sheet for Healthcare Providers:  IncredibleEmployment.be  This test is no t yet approved or cleared by the [place] FDA and  has been authorized for detection and/or diagnosis of SARS-CoV-2 by FDA under an Emergency Use Authorization (EUA). This EUA will remain  in effect (meaning this test can be used) for the duration of the COVID-19 declaration under Section 564(b)(1) of the Act, 21 U.S.C.section 360bbb-3(b)(1), unless the authorization is terminated  or revoked sooner.       Influenza A by PCR NEGATIVE NEGATIVE Final    Influenza B by PCR NEGATIVE NEGATIVE Final    Comment: (NOTE) The Xpert Xpress SARS-CoV-2/FLU/RSV plus assay is intended as an aid in the diagnosis of influenza from Nasopharyngeal swab specimens and should not be used as a sole basis for treatment. Nasal washings and aspirates are unacceptable for Xpert Xpress SARS-CoV-2/FLU/RSV testing.  Fact Sheet for Patients: EntrepreneurPulse.com.au  Fact Sheet for Healthcare Providers: IncredibleEmployment.be  This test is not yet approved or cleared by the [place] FDA and has been authorized for detection and/or diagnosis of SARS-CoV-2 by FDA under an Emergency Use Authorization (EUA). This EUA will remain in effect (meaning this test can be used) for the duration of the COVID-19 declaration under Section 564(b)(1) of the Act, 21 U.S.C. section 360bbb-3(b)(1), unless the authorization is terminated or revoked.  Performed at [facility], [address] [place], [place] [address]   Blood Culture (routine x 2)     Status: None   Collection Time: 03/12/2020  7:08 AM   Specimen: BLOOD  Result Value Ref Range Status   Specimen Description BLOOD LEFT [facility]  Final   Special Requests   Final    BOTTLES DRAWN AEROBIC AND ANAEROBIC Blood Culture adequate volume   Culture   Final    NO GROWTH 5 DAYS Performed at [facility], [address], [place], [place] [address]    Report Status 03/18/2020 FINAL  Final  Blood Culture (routine x 2)     Status: None   Collection Time: 02/17/2020  7:08 AM   Specimen: BLOOD  Result Value Ref Range Status   Specimen Description BLOOD RIGHT HAND  Final   Special Requests   Final    BOTTLES DRAWN AEROBIC AND ANAEROBIC Blood Culture adequate volume   Culture   Final    NO GROWTH 5 DAYS Performed at [facility], [address] [place], [place] [address]    Report Status 03/18/2020 FINAL  Final  Urine culture     Status: None   Collection  Time: 03/06/2020  7:10 AM   Specimen: In/Out Cath Urine  Result Value Ref Range Status   Specimen Description   Final    IN/OUT CATH URINE Performed at [facility], [address], [place], [place] [address]    Special Requests   Final    NONE Performed at [facility], [address] [place], [place] [address]    Culture   Final    NO GROWTH Performed at [facility], [place] [address], [place], [place] [address]    Report Status 03/14/2020 FINAL  Final  MRSA PCR Screening     Status: None   Collection Time: 02/21/2020 10:43 AM  Specimen: Nasopharyngeal  Result Value Ref Range Status   MRSA by PCR NEGATIVE NEGATIVE Final    Comment:        The GeneXpert MRSA Assay (FDA approved for NASAL specimens only), is one component of a comprehensive MRSA colonization surveillance program. It is not intended to diagnose MRSA infection nor to guide or monitor treatment for MRSA infections. Performed at [facility], [address] [place], [place] [address]   Culture, respiratory (non-expectorated)     Status: None   Collection Time: 03/15/20  6:53 PM   Specimen: Tracheal Aspirate; Respiratory  Result Value Ref Range Status   Specimen Description   Final    TRACHEAL ASPIRATE Performed at [facility], [address] [place], [place] [address]    Special Requests   Final    NONE Performed at [facility], [address] [place], [place] [address]    Gram Stain   Final    FEW WBC PRESENT, PREDOMINANTLY PMN ABUNDANT GRAM NEGATIVE RODS FEW GRAM POSITIVE COCCI IN PAIRS IN CLUSTERS Performed at [facility], [place] [address], [place], [place] [address]    Culture ABUNDANT STREPTOCOCCUS PNEUMONIAE  Final   Report Status 03/18/2020 FINAL  Final   Organism ID, Bacteria STREPTOCOCCUS PNEUMONIAE  Final      Susceptibility   Streptococcus pneumoniae - MIC*    ERYTHROMYCIN 2 RESISTANT Resistant     LEVOFLOXACIN 1 SENSITIVE  Sensitive     VANCOMYCIN 0.5 SENSITIVE Sensitive     PENO - penicillin <=0.06      PENICILLIN (non-meningitis) <=0.06 SENSITIVE Sensitive     PENICILLIN (oral) <=0.06 SENSITIVE Sensitive     CEFTRIAXONE (non-meningitis) <=0.12 SENSITIVE Sensitive     * ABUNDANT STREPTOCOCCUS PNEUMONIAE    Lab Basic Metabolic Panel: Recent Labs  Lab 03/17/20 0409 03/18/20 0139 03/18/20 0940 03/19/20 0401 03-30-20 0524  NA 141 142 140 140 144  K 4.4 4.6 4.4 4.1 3.8  CL 111 113* 114* 113* 111  CO2 20* 24 21* 21* 26  GLUCOSE 113* 124* 122* 130* 132*  BUN 28* 29* 33* 33* 42*  CREATININE 3.16* 2.76* 2.60* 2.36* 2.61*  CALCIUM 8.3* 8.5* 8.4* 8.3* 8.7*  MG 1.6* 2.7* 2.5* 2.3 2.3  PHOS 4.2 4.5  4.4 4.0  4.2 3.8 4.0   Liver Function Tests: Recent Labs  Lab 03/17/20 0409 03/18/20 0139 03/18/20 0940 03/19/20 0401 03/30/2020 0524  ALBUMIN 2.6* 2.6* 2.4* 2.3* 2.4*   No results for input(s): LIPASE, AMYLASE in the last 168 hours. No results for input(s): AMMONIA in the last 168 hours. CBC: Recent Labs  Lab 03/17/20 0409 03/18/20 0139 03/18/20 0940 03/19/20 0401 03/30/20 0524  WBC 8.4 7.3 6.7 6.0 8.6  HGB 10.8* 11.0* 10.6* 10.4* 11.1*  HCT 33.6* 34.7* 33.0* 32.1* 34.8*  MCV 96.0 96.7 97.3 95.3 96.9  PLT 156 163 170 171 204   Cardiac Enzymes: No results for input(s): CKTOTAL, CKMB, CKMBINDEX, TROPONINI in the last 168 hours. Sepsis Labs: Recent Labs  Lab 03/15/20 0513 03/16/20 0556 03/18/20 0139 03/18/20 0940 03/19/20 0401 Mar 30, 2020 0524  PROCALCITON 0.38  --  0.42  --  0.36 2.91  WBC 8.5   < > 7.3 6.7 6.0 8.6   < > = values in this interval not displayed.      Flora Lipps 30-Mar-2020, 8:58 PM

## 2020-04-16 NOTE — Progress Notes (Signed)
[facility]  ROUNDING NOTE   Subjective:   UOP 5933m - after IV furosemide  Patient was extubated and then reintubated yesterday.  Objective:  Vital signs in last 24 hours:  Temp:  [98.06 F (36.7 C)-99.86 F (37.7 C)] 99.86 F (37.7 C) (01/05 1100) Pulse Rate:  [63-97] 76 (01/05 1100) Resp:  [15-32] 15 (01/05 1100) BP: (114-177)/(61-109) 129/67 (01/05 1100) SpO2:  [89 %-100 %] 97 % (01/05 1100) FiO2 (%):  [40 %-100 %] 40 % (01/05 0734)  Weight change:  Filed Weights   02/29/2020 1027  Weight: 106.2 kg    Intake/Output: I/O last 3 completed shifts: In: 5759.4 [I.V.:4095.9; NG/GT:1013.3; IV Piggyback:650.2] Out: 74536[Urine:7275]   Intake/Output this shift:  No intake/output data recorded.  Physical Exam: General:  Critically ill   Head:  ETT  Eyes:  PERRL  Neck:  trachea midline  Lungs:   PRVC FiO2 40%  Heart:  regular  Abdomen:   Soft, bowel sounds present  Extremities:  1+ peripheral edema.  Neurologic:  Intubated, sedated  Skin:  No lesions  Access:  No hemodialysis access    Basic Metabolic Panel: Recent Labs  Lab 03/17/20 0409 03/18/20 0139 03/18/20 0940 03/19/20 0401 0February 02, [id number]  NA 141 142 140 140 144  K 4.4 4.6 4.4 4.1 3.8  CL 111 113* 114* 113* 111  CO2 20* 24 21* 21* 26  GLUCOSE 113* 124* 122* 130* 132*  BUN 28* 29* 33* 33* 42*  CREATININE 3.16* 2.76* 2.60* 2.36* 2.61*  CALCIUM 8.3* 8.5* 8.4* 8.3* 8.7*  MG 1.6* 2.7* 2.5* 2.3 2.3  PHOS 4.2 4.5  4.4 4.0  4.2 3.8 4.0    Liver Function Tests: Recent Labs  Lab 02/29/2020 1708 03/16/20 0557 03/17/20 0409 03/18/20 0139 03/18/20 0940 03/19/20 0401 [id number]  AST 22  --   --   --   --   --   --   ALT 23  --   --   --   --   --   --   ALKPHOS 65  --   --   --   --   --   --   BILITOT 0.6  --   --   --   --   --   --   PROT 5.7*  --   --   --   --   --   --   ALBUMIN 3.3*   < > 2.6* 2.6* 2.4* 2.3* 2.4*   < > = values in this interval not displayed.   No results  for input(s): LIPASE, AMYLASE in the last 168 hours. No results for input(s): AMMONIA in the last 168 hours.  CBC: Recent Labs  Lab 03/17/20 0409 03/18/20 0139 03/18/20 0940 03/19/20 0401 0Feb 02, [id number]  WBC 8.4 7.3 6.7 6.0 8.6  HGB 10.8* 11.0* 10.6* 10.4* 11.1*  HCT 33.6* 34.7* 33.0* 32.1* 34.8*  MCV 96.0 96.7 97.3 95.3 96.9  PLT 156 163 170 171 204    Cardiac Enzymes: No results for input(s): CKTOTAL, CKMB, CKMBINDEX, TROPONINI in the last 168 hours.  BNP: Invalid input(s): POCBNP  CBG: Recent Labs  Lab 03/19/20 1953 03/19/20 2345 [id number]  GLUCAP 87 92 99 92 91    Microbiology: Results for orders placed or performed during the hospital encounter of 03/05/2020  Resp Panel by RT-PCR (Flu A&B, Covid) Nasopharyngeal Swab     Status: None   Collection Time: 02/27/2020  7:00 AM  Specimen: Nasopharyngeal Swab; Nasopharyngeal(NP) swabs in vial transport medium  Result Value Ref Range Status   SARS Coronavirus 2 by RT PCR NEGATIVE NEGATIVE Final    Comment: (NOTE) SARS-CoV-2 target nucleic acids are NOT DETECTED.  The SARS-CoV-2 RNA is generally detectable in upper respiratory specimens during the acute phase of infection. The lowest concentration of SARS-CoV-2 viral copies this assay can detect is 138 copies/mL. A negative result does not preclude SARS-Cov-2 infection and should not be used as the sole basis for treatment or other patient management decisions. A negative result may occur with  improper specimen collection/handling, submission of specimen other than nasopharyngeal swab, presence of viral mutation(s) within the areas targeted by this assay, and inadequate number of viral copies(<138 copies/mL). A negative result must be combined with clinical observations, patient history, and epidemiological information. The expected result is Negative.  Fact Sheet for Patients:  EntrepreneurPulse.com.au  Fact Sheet  for Healthcare Providers:  IncredibleEmployment.be  This test is no t yet approved or cleared by the [place] FDA and  has been authorized for detection and/or diagnosis of SARS-CoV-2 by FDA under an Emergency Use Authorization (EUA). This EUA will remain  in effect (meaning this test can be used) for the duration of the COVID-19 declaration under Section 564(b)(1) of the Act, 21 U.S.C.section 360bbb-3(b)(1), unless the authorization is terminated  or revoked sooner.       Influenza A by PCR NEGATIVE NEGATIVE Final   Influenza B by PCR NEGATIVE NEGATIVE Final    Comment: (NOTE) The Xpert Xpress SARS-CoV-2/FLU/RSV plus assay is intended as an aid in the diagnosis of influenza from Nasopharyngeal swab specimens and should not be used as a sole basis for treatment. Nasal washings and aspirates are unacceptable for Xpert Xpress SARS-CoV-2/FLU/RSV testing.  Fact Sheet for Patients: EntrepreneurPulse.com.au  Fact Sheet for Healthcare Providers: IncredibleEmployment.be  This test is not yet approved or cleared by the [place] FDA and has been authorized for detection and/or diagnosis of SARS-CoV-2 by FDA under an Emergency Use Authorization (EUA). This EUA will remain in effect (meaning this test can be used) for the duration of the COVID-19 declaration under Section 564(b)(1) of the Act, 21 U.S.C. section 360bbb-3(b)(1), unless the authorization is terminated or revoked.  Performed at [facility], [address] [place], [place] [address]   Blood Culture (routine x 2)     Status: None   Collection Time: 02/26/2020  7:08 AM   Specimen: BLOOD  Result Value Ref Range Status   Specimen Description BLOOD LEFT [facility]  Final   Special Requests   Final    BOTTLES DRAWN AEROBIC AND ANAEROBIC Blood Culture adequate volume   Culture   Final    NO GROWTH 5 DAYS Performed at [facility], [address],  [place], [place] [address]    Report Status 03/18/2020 FINAL  Final  Blood Culture (routine x 2)     Status: None   Collection Time: 02/19/2020  7:08 AM   Specimen: BLOOD  Result Value Ref Range Status   Specimen Description BLOOD RIGHT HAND  Final   Special Requests   Final    BOTTLES DRAWN AEROBIC AND ANAEROBIC Blood Culture adequate volume   Culture   Final    NO GROWTH 5 DAYS Performed at [facility], [address] [place], [place] [address]    Report Status 03/18/2020 FINAL  Final  Urine culture     Status: None   Collection Time: 03/07/2020  7:10  AM   Specimen: In/Out Cath Urine  Result Value Ref Range Status   Specimen Description   Final    IN/OUT CATH URINE Performed at [facility], [address], [place], [place] [address]    Special Requests   Final    NONE Performed at [facility], [address] [place], [place] [address]    Culture   Final    NO GROWTH Performed at [facility], [place] [address], [place], [place] [address]    Report Status 03/14/2020 FINAL  Final  MRSA PCR Screening     Status: None   Collection Time: 03/01/2020 10:43 AM   Specimen: Nasopharyngeal  Result Value Ref Range Status   MRSA by PCR NEGATIVE NEGATIVE Final    Comment:        The GeneXpert MRSA Assay (FDA approved for NASAL specimens only), is one component of a comprehensive MRSA colonization surveillance program. It is not intended to diagnose MRSA infection nor to guide or monitor treatment for MRSA infections. Performed at [facility], [address] [place], [place] [address]   Culture, respiratory (non-expectorated)     Status: None   Collection Time: 03/15/20  6:53 PM   Specimen: Tracheal Aspirate; Respiratory  Result Value Ref Range Status   Specimen Description   Final    TRACHEAL ASPIRATE Performed at [facility], [address] [place], [place] [address]    Special Requests   Final    NONE Performed at  [facility], [address] [place], [place] [address]    Gram Stain   Final    FEW WBC PRESENT, PREDOMINANTLY PMN ABUNDANT GRAM NEGATIVE RODS FEW GRAM POSITIVE COCCI IN PAIRS IN CLUSTERS Performed at [facility], [place] [address] [place], [place] [address]    Culture ABUNDANT STREPTOCOCCUS PNEUMONIAE  Final   Report Status 03/18/2020 FINAL  Final   Organism ID, Bacteria STREPTOCOCCUS PNEUMONIAE  Final      Susceptibility   Streptococcus pneumoniae - MIC*    ERYTHROMYCIN 2 RESISTANT Resistant     LEVOFLOXACIN 1 SENSITIVE Sensitive     VANCOMYCIN 0.5 SENSITIVE Sensitive     PENO - penicillin <=0.06      PENICILLIN (non-meningitis) <=0.06 SENSITIVE Sensitive     PENICILLIN (oral) <=0.06 SENSITIVE Sensitive     CEFTRIAXONE (non-meningitis) <=0.12 SENSITIVE Sensitive     * ABUNDANT STREPTOCOCCUS PNEUMONIAE    Coagulation Studies: No results for input(s): LABPROT, INR in the last 72 hours.  Urinalysis: No results for input(s): COLORURINE, LABSPEC, PHURINE, GLUCOSEU, HGBUR, BILIRUBINUR, KETONESUR, PROTEINUR, UROBILINOGEN, NITRITE, LEUKOCYTESUR in the last 72 hours.  Invalid input(s): APPERANCEUR    Imaging: DG CHEST PORT 1 VIEW  Result Date: 04-09-20 CLINICAL DATA:  CHF. EXAM: PORTABLE CHEST 1 VIEW COMPARISON:  03/19/2020. FINDINGS: Endotracheal tube and NG tube stable position. Stable cardiomegaly. Interstitial infiltrates/edema again noted. Interim slight improvement from prior exam. Lung volumes with bilateral subsegmental atelectasis. Tiny bilateral pleural effusions cannot be excluded. No pneumothorax. IMPRESSION: 1. Lines and tubes stable position. 2. Stable cardiomegaly. Bilateral interstitial infiltrates/edema again noted. Interim improvement from prior exam. 3. Low lung volumes with bilateral subsegmental atelectasis. Electronically Signed   By: Marcello Moores  Register   On: 09-Apr-2020 08:41   DG Chest Port 1 View  Result Date: 03/19/2020 CLINICAL DATA:   Intubation.  OG tube placement. EXAM: PORTABLE CHEST 1 VIEW COMPARISON:  Chest x-ray 03/18/2020. FINDINGS: Endotracheal tube and NG tube in stable position. Stable  cardiomegaly. Low lung volumes. Diffuse bilateral pulmonary infiltrates/edema noted on today's exam. No pleural effusion or pneumothorax no acute bony abnormality. IMPRESSION: 1. Endotracheal tube and NG tube in stable position. 2. Stable cardiomegaly. 3. Low lung volumes. Diffuse bilateral pulmonary infiltrates/edema noted on today's exam. Electronically Signed   By: Marcello Moores  Register   On: 03/19/2020 13:55   DG Abd Portable 1V  Result Date: 03/19/2020 CLINICAL DATA:  Status post NG tube placement. EXAM: PORTABLE ABDOMEN - 1 VIEW COMPARISON:  Single-view of the abdomen 03/16/2020. FINDINGS: NG tube is in place with the tip and side-port in the stomach. IMPRESSION: NG tube in good position. Electronically Signed   By: Inge Rise M.D.   On: 03/19/2020 13:59     Medications:   . cefTRIAXone (ROCEPHIN)  IV 2 g (2020-04-18 0926)  . dexmedetomidine (PRECEDEX) IV infusion Stopped (03/19/20 1321)  . dextrose 5 % and 0.9% NaCl 75 mL/hr at April 18, 2020 0400  . famotidine (PEPCID) IV 20 mg (04/18/20 0937)  . feeding supplement (VITAL HIGH PROTEIN) 20 mL/hr at 2020-04-18 0400  . fentaNYL infusion INTRAVENOUS 300 mcg/hr (April 18, 2020 0946)  . heparin 1,650 Units/hr (Apr 18, 2020 0934)  . metronidazole 500 mg (Apr 18, 2020 0945)  . midazolam 7 mg/hr (18-Apr-2020 0948)  . niCARDipine Stopped (03/19/20 1900)   . amiodarone  400 mg Per Tube Daily  . atorvastatin  40 mg Per Tube QHS  . carvedilol  3.125 mg Per Tube BID WC  . chlorhexidine gluconate (MEDLINE KIT)  15 mL Mouth Rinse BID  . Chlorhexidine Gluconate Cloth  6 each Topical Q0600  . docusate  100 mg Per Tube BID  . feeding supplement (PROSource TF)  45 mL Per Tube Daily  . fentaNYL  100 mcg Intravenous Once  . furosemide  40 mg Intravenous Daily  . hydrALAZINE  10 mg Per Tube TID  . insulin aspart   0-9 Units Subcutaneous Q4H  . ipratropium-albuterol  3 mL Nebulization Q6H  . isosorbide dinitrate  5 mg Per Tube TID  . mouth rinse  15 mL Mouth Rinse 10 times per day  . midazolam  2 mg Intravenous Once  . midazolam  4 mg Intravenous Once  . polyethylene glycol  17 g Per Tube Daily   acetaminophen (TYLENOL) oral liquid 160 mg/5 mL, atropine, dextrose, fentaNYL, fentaNYL, hydrALAZINE, midazolam, ondansetron (ZOFRAN) IV, sodium chloride flush  Assessment/ Plan:   Mr. Jeremiah Keith is a 35 y.o. 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   1.  Acute kidney injury on chronic kidney disease stage IIIb baseline creatinine 2.27 with an EGFR of 38 on 02/10/20. History of proteinuria Secondary to ATN from hypotension, cardiac arrest and  renal ischemia. Nonoliguric urine output.  - No indication for dialysis. Responding to IV furosemide.   2.  Acute respiratory failure: requiring intubation and mechanical ventilation. Now with aspiration pneumonia on ceftriaxone and metronidazole.   3. Acute exacerbation of systolic congestive heart failure: with atrial fibrillation. Weaned off vasopressors from cardiogenic shock.  - carvedilol, isosorbide dinitrate and hydralazine.  - IV furosemide.     LOS: 7 Shandria Clinch 02/03/221:11 PM

## 2020-04-16 NOTE — Consult Note (Signed)
Consultation Note Date: Mar 22, 2020   Patient Name: Jeremiah Keith  DOB: 1985-12-25  MRN: [id number]  Age / Sex: 35 y.o., male  PCP: [name], MD Referring Physician: Flora Lipps, MD  Reason for Consultation: Establishing goals of care  HPI/Patient Profile: 35 y.o. male  with past medical history of paroxsymal a fib on eliquis, htn, and CKD admitted on 02/18/2020 with out of hospital v fib arrest. He was scheduled for cardioversion and arrested on way to appointment. MRI brain with acute infarct of the left central pons with superimposed numerous pontine micro-hemorrhages. He was extubated 03/19/20 but required reintubation. PMT consulted to discuss goals of care.  Clinical Assessment and Goals of Care: I have reviewed medical records including EPIC notes, labs and imaging, received report from RN and Dr. Mortimer Fries, assessed the patient and then met with patient's spouse, sister, mother, and father to discuss diagnosis prognosis, GOC, EOL wishes, disposition and options.  I introduced Palliative Medicine as specialized medical care for people living with serious illness. It focuses on providing relief from the symptoms and stress of a serious illness. The goal is to improve quality of life for both the patient and the family.  Family tells me that prior to Thanksgiving 2021 the patient was doing very well. His decline has been rapid and shocking. They tell me about his troubles with chest pain, weakness, and lethargy starting around Thanksgiving. They tell me about his arrest while he was with his father. They share about patient's three year old son. Wife tells me patient had told her he didn't feel like he would live much longer and she would raise their son alone. Mother also shares a similar statement about patient telling her he would not live much longer.    We discussed patient's current illness and what it means in the larger context of  patient's on-going co-morbidities.  Natural disease trajectory and expectations at EOL were discussed. We discussed his heart failure, strokes, and renal failure.   I attempted to elicit values and goals of care important to the patient. Family unanimously shares that patient had been very vocal in the past about not wanting to be on machines. They all agree he would not want current support he is receiving. They all share he would definitely not want a trach and would not want dialysis.  We discuss keeping the patient at the center of decision making and trying to make decisions for him that he would make for himself.   The difference between aggressive medical intervention and comfort care was considered in light of the patient's goals of care. Family all agrees they are ready to transition to comfort focused care and free patient from ventilator.  Emotional support provided throughout. Questions and concerns were addressed.  Primary Decision Maker NEXT OF KIN - spouse  SUMMARY OF RECOMMENDATIONS   Discussed above with Dr Mortimer Fries, family saying goodbyes to patient, chaplain consulted, dr Mortimer Fries to proceed with comfort measures when family ready  Code Status/Advance Care Planning:  DNR  Prognosis:   Hours - Days  Discharge Planning: Anticipated Hospital Death      Primary Diagnoses: Present on Admission: **None**   I have reviewed the medical record, interviewed the patient and family, and examined the patient. The following aspects are pertinent.  Past Medical History:  Diagnosis Date  . Aortic root dilatation ([place])    a. 01/2020 Echo: Ao root 4.3cm.  . CKD (chronic kidney disease)   . Depression  after death of family members  . History of chicken pox   . History of echocardiogram    a. 01/2020 Echo: EF 55-60%. No rwma. Mild LVH. Triv AI. Ao root 4.3 cm.  . History of stress test    a. 10/2017 MV: EF 40-45%, diff HK. No scar/ischemia-->intermediate risk in setting of  depressed EF however, EF 55-60% by echo-->med managed.  . Hypertension   . PAF (paroxysmal atrial fibrillation) (HCC)    a. CHA2DS2VASc = 1-->eliquis.  . Substance abuse [facility])    h/o cocaine abuse   Social History   Socioeconomic History  . Marital status: Married    Spouse name: Not on file  . Number of children: Not on file  . Years of education: 68  . Highest education level: Not on file  Occupational History  . Not on file  Tobacco Use  . Smoking status: Former Smoker    Packs/day: 25.00    Types: Cigarettes    Quit date: 02/10/2020    Years since quitting: 0.1  . Smokeless tobacco: Never Used  Substance and Sexual Activity  . Alcohol use: Yes    Alcohol/week: 0.0 standard drinks  . Drug use: Not Currently  . Sexual activity: Not on file  Other Topics Concern  . Not on file  Social History Narrative   [employer] fan   Married 2016, 2 stepkids, 1 biological son.    Works at [employer] Strain: Not on file  Food Insecurity: Not on file  Transportation Needs: Not on file  Physical Activity: Not on file  Stress: Not on file  Social Connections: Not on file   Family History  Problem Relation Age of Onset  . Heart disease Mother   . Diabetes Father   . Hypertension Father   . Prostate cancer Maternal Grandfather        possible prostate cancer  . Colon cancer Neg Hx    Scheduled Meds: . amiodarone  400 mg Per Tube Daily  . atorvastatin  40 mg Per Tube QHS  . carvedilol  3.125 mg Per Tube BID WC  . chlorhexidine gluconate (MEDLINE KIT)  15 mL Mouth Rinse BID  . Chlorhexidine Gluconate Cloth  6 each Topical Q0600  . docusate  100 mg Per Tube BID  . feeding supplement (PROSource TF)  45 mL Per Tube Daily  . fentaNYL  100 mcg Intravenous Once  . furosemide  40 mg Intravenous Daily  . hydrALAZINE  10 mg Per Tube TID  . insulin aspart  0-9 Units Subcutaneous Q4H  . ipratropium-albuterol  3 mL  Nebulization Q6H  . isosorbide dinitrate  5 mg Per Tube TID  . mouth rinse  15 mL Mouth Rinse 10 times per day  . midazolam  2 mg Intravenous Once  . midazolam  4 mg Intravenous Once  . polyethylene glycol  17 g Per Tube Daily   Continuous Infusions: . cefTRIAXone (ROCEPHIN)  IV 2 g (04/05/2020 0926)  . dexmedetomidine (PRECEDEX) IV infusion Stopped (03/19/20 1321)  . dextrose 5 % and 0.9% NaCl 75 mL/hr at April 05, 2020 0400  . famotidine (PEPCID) IV 20 mg (2020-04-05 0937)  . feeding supplement (VITAL HIGH PROTEIN) 20 mL/hr at 2020/04/05 0400  . fentaNYL infusion INTRAVENOUS 300 mcg/hr (05-Apr-2020 0946)  . heparin 1,650 Units/hr (05-Apr-2020 0934)  . metronidazole 500 mg (2020-04-05 0945)  . midazolam 7 mg/hr (2020/04/05 1100)  . niCARDipine Stopped (  03/19/20 1900)   PRN Meds:.acetaminophen (TYLENOL) oral liquid 160 mg/5 mL, atropine, dextrose, fentaNYL, fentaNYL, hydrALAZINE, midazolam, ondansetron (ZOFRAN) IV, sodium chloride flush Allergies  Allergen Reactions  . Nsaids     Chronic kidney disease  . Penicillins Rash    Has patient had a PCN reaction causing immediate rash, facial/tongue/throat swelling, SOB or lightheadedness with hypotension: Yes Has patient had a PCN reaction causing severe rash involving mucus membranes or skin necrosis: No Has patient had a PCN reaction that required hospitalization: No Has patient had a PCN reaction occurring within the last 10 years: No If all of the above answers are "NO", then may proceed with Cephalosporin use.    Review of Systems  Unable to perform ROS: Intubated    Physical Exam Constitutional:      Comments: Unresponsive, sedated and intubated  Cardiovascular:     Rate and Rhythm: Normal rate and regular rhythm.  Skin:    General: Skin is warm and dry.     Vital Signs: BP 129/67   Pulse 76   Temp 99.86 F (37.7 C)   Resp 15   Ht 5' 7"  (1.702 m)   Wt 106.2 kg   SpO2 96%   BMI 36.67 kg/m  Pain Scale: CPOT       SpO2: SpO2: 96  % O2 Device:SpO2: 96 % O2 Flow Rate: .   IO: Intake/output summary:   Intake/Output Summary (Last 24 hours) at 03-27-2020 1425 Last data filed at 03-27-20 0600 Gross per 24 hour  Intake 1843.86 ml  Output 4500 ml  Net -2656.14 ml    LBM: Last BM Date: 2020/03/27 Baseline Weight: Weight: 106.2 kg Most recent weight: Weight: 106.2 kg     Palliative Assessment/Data: PPS 10%    Time Total: 65 minutes  Greater than 50%  of this time was spent counseling and coordinating care related to the above assessment and plan.  Juel Burrow, DNP, AGNP-C Palliative Medicine Team [phone] Pager: [phone]

## 2020-04-16 NOTE — Progress Notes (Signed)
Patient extubated with RT and RN at bedside. IV morphine initiated prior to extubation. Patient suctioned and oral care provided. Family brought to bedside for end of life transition.

## 2020-04-16 NOTE — Progress Notes (Signed)
ANTICOAGULATION CONSULT NOTE  Pharmacy Consult for heparin Indication: Afib  Patient Measurements: Heparin Dosing Weight: 88 kg  Labs: Recent Labs    03/18/20 0940 03/18/20 2104 03/19/20 0401 2020/04/12 0524  HGB 10.6*  --  10.4* 11.1*  HCT 33.0*  --  32.1* 34.8*  PLT 170  --  171 204  HEPARINUNFRC 0.29* 0.45 0.61 0.26*  CREATININE 2.60*  --  2.36* 2.61*    Estimated Creatinine Clearance: 45.4 mL/min (A) (by C-G formula based on SCr of 2.61 mg/dL (H)).   Medical History: Past Medical History:  Diagnosis Date  . Aortic root dilatation ([place])    a. 01/2020 Echo: Ao root 4.3cm.  . CKD (chronic kidney disease)   . Depression    after death of family members  . History of chicken pox   . History of echocardiogram    a. 01/2020 Echo: EF 55-60%. No rwma. Mild LVH. Triv AI. Ao root 4.3 cm.  . History of stress test    a. 10/2017 MV: EF 40-45%, diff HK. No scar/ischemia-->intermediate risk in setting of depressed EF however, EF 55-60% by echo-->med managed.  . Hypertension   . PAF (paroxysmal atrial fibrillation) (HCC)    a. CHA2DS2VASc = 1-->eliquis.  . Substance abuse [facility])    h/o cocaine abuse   Assessment: 35 year old male s/p cardiac arrest with ROSC requiring multiple defibrillations and epinephrine. Patient was on his way to an appointment for cardioversion for persistent afib. Patient has PTA Eliquis but due to nature of presentation, post-arrest, difficult to assess last dose of Eliquis PTA, however patient had been here since 12/29 and had not received Eliquis inpatient. Patient was previously on heparin drip for ACS/chest pain. Pharmacy has been consulted for heparin dosing. H&H, platelets currently stable  Goal of Therapy:  Heparin level 0.3-0.7 units/ml (aim low) Monitor platelets by anticoagulation protocol: Yes   Plan:  1/5 0524 HL 0.26 subtherapeutic. Heparin 1000 unit bolus followed by increase in drip to 1650 units/hr. Recheck HL at 1700. CBC tomorrow  morning.  Tawnya Crook, PharmD 04/12/20 9:14 AM

## 2020-04-16 DEATH — deceased

## 2021-03-24 IMAGING — CT CT HEAD W/O CM
3 of 4 series · 13 of 47 positions shown, 15 images · non-contrast
Comparison: Brain MRI 03/15/2020 and earlier.

CLINICAL DATA: 34-year-old male status post CPR. Worsening mental
status. Small right motor strip and left pontine infarcts on recent
MRI.

EXAM:
CT HEAD WITHOUT CONTRAST
TECHNIQUE: Contiguous axial images were obtained from the base of the skull
through the vertex without intravenous contrast.

[Series 2: head bone · axial · 0.48mm/px · z∈[-201,-169]mm · 3 of 83 slices shown]
[im 9/83  bone]
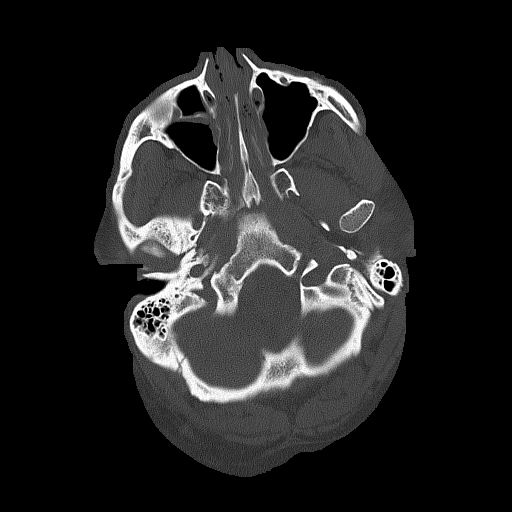
[im 17/83  bone]
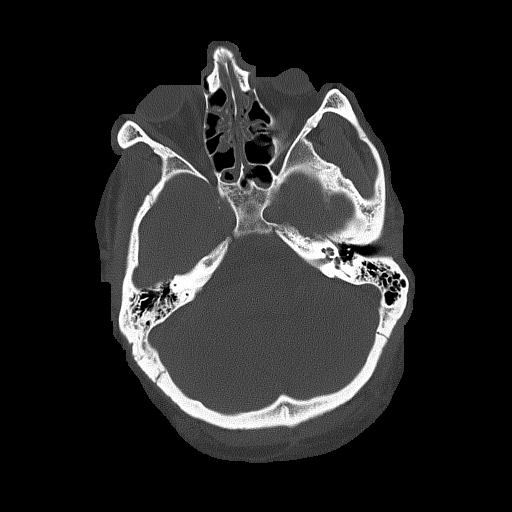
[im 25/83  bone]
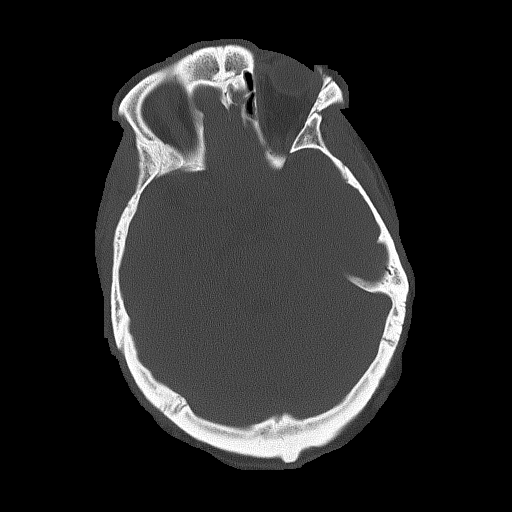

[Series 3: coronal soft tissue · coronal · 0.31mm/px · 3 of 78 slices shown]
[im 26/78  brain]
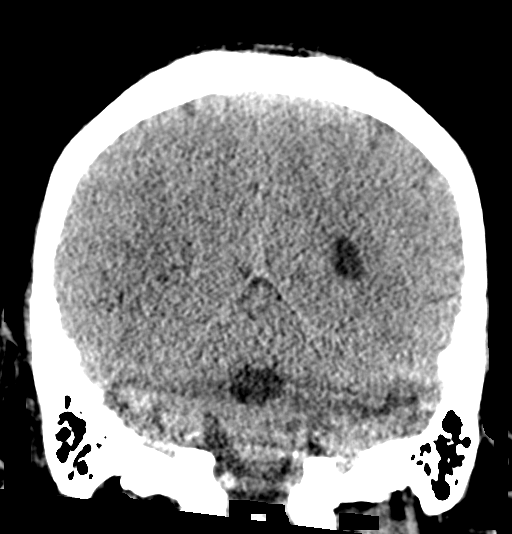
[im 35/78  brain]
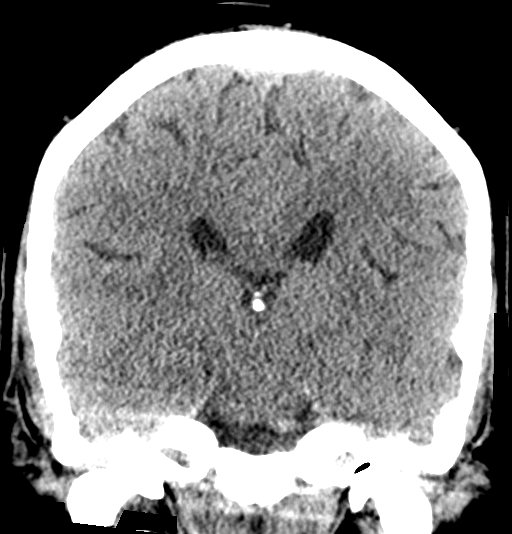
[im 43/78  brain]
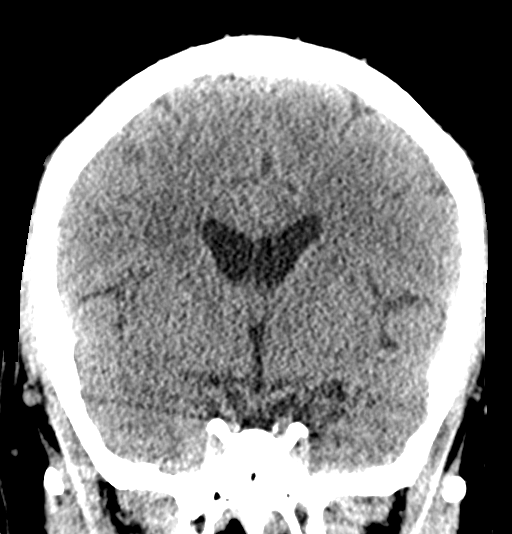

[Series 4: ax head wo · axial · 0.31mm/px · z∈[-168,-56]mm · 7 of 34 slices shown, 9 images]
[im 5/34  brain]
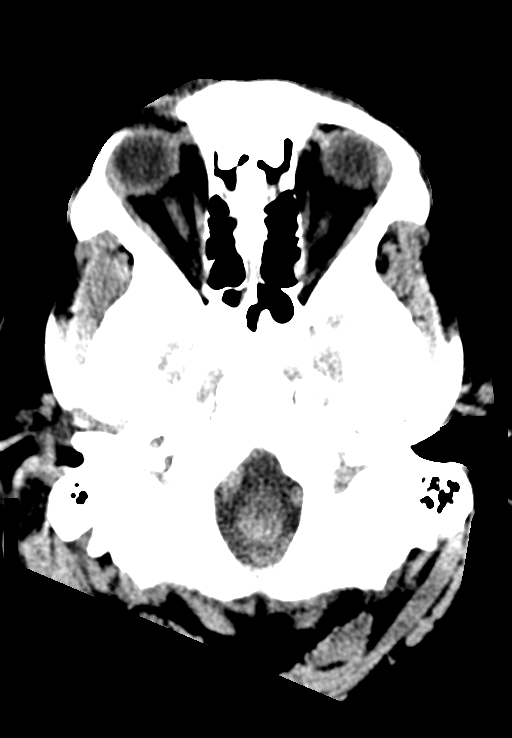
[im 5/34  bone]
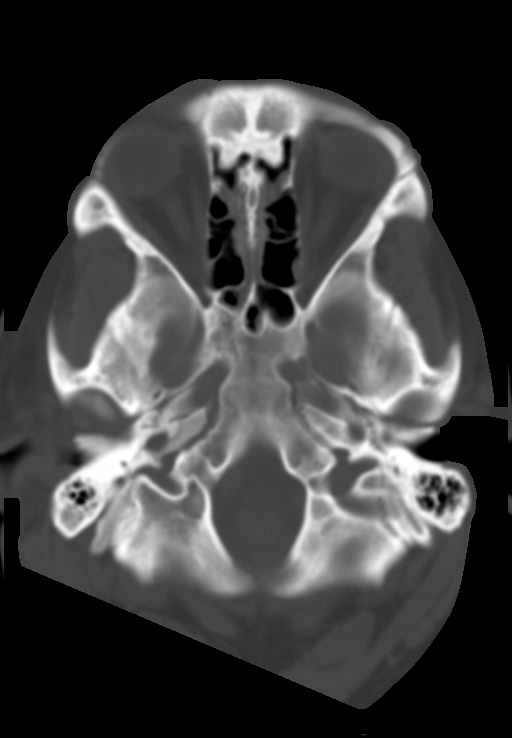
[im 9/34  brain]
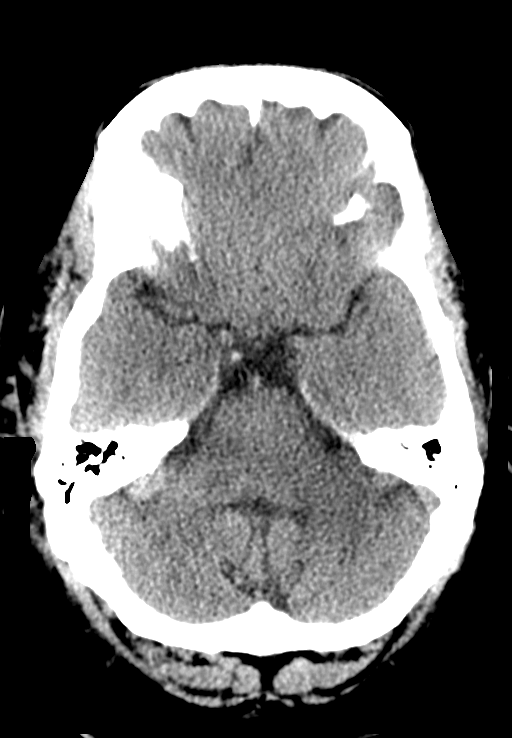
[im 13/34  brain]
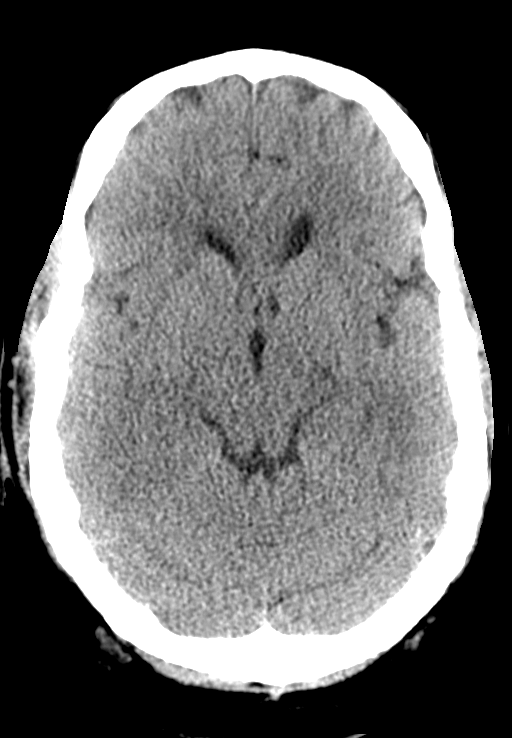
[im 17/34  brain]
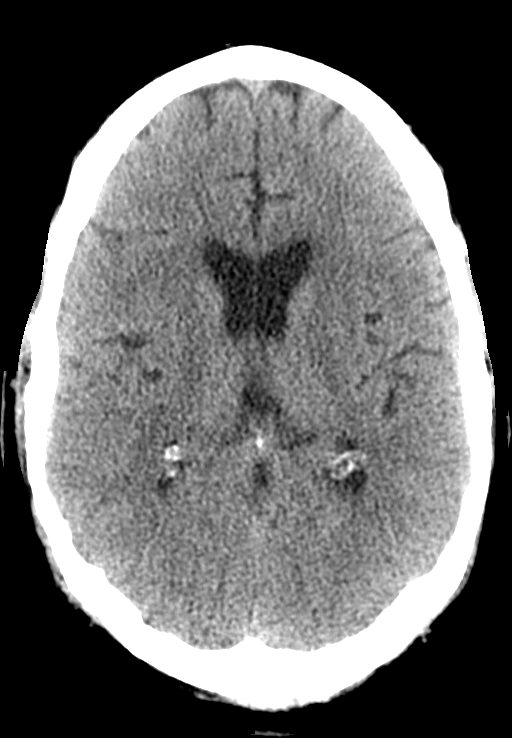
[im 21/34  brain]
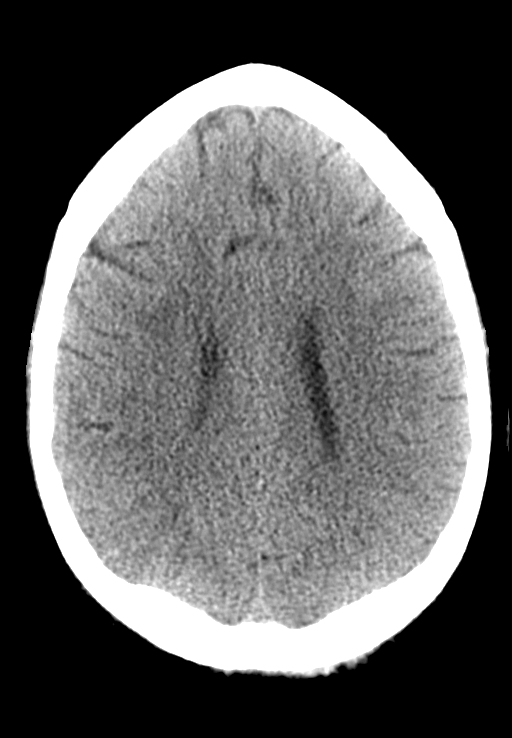
[im 21/34  bone]
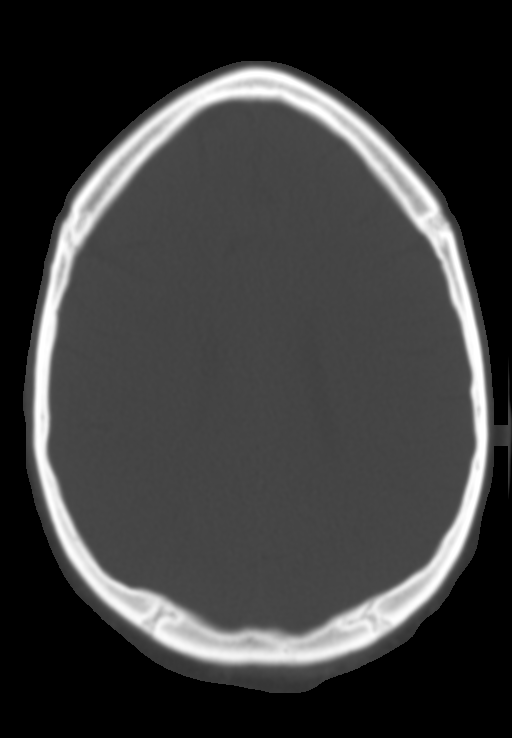
[im 25/34  brain]
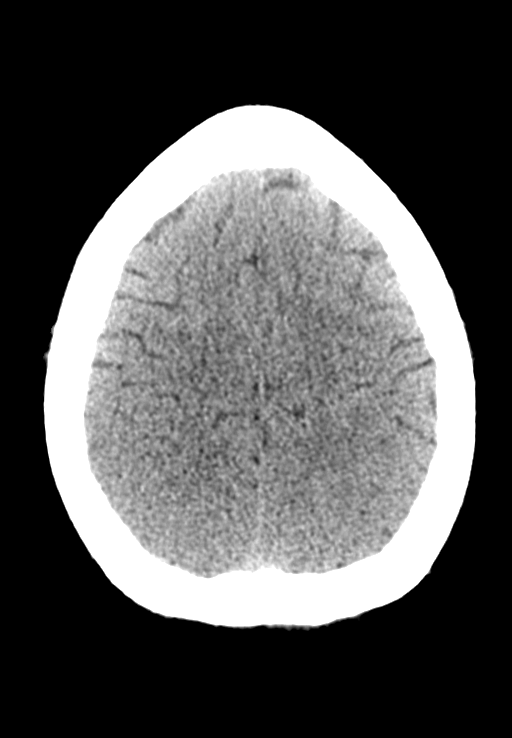
[im 29/34  brain]
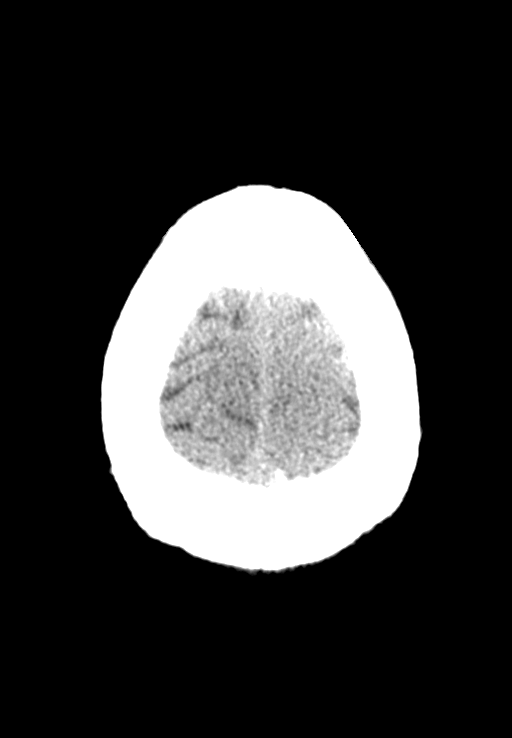

[13 of 47 positions shown; findings below may reference images not displayed]

FINDINGS: Brain: Inconspicuous gray-white matter differentiation throughout
the brain, although not significantly changed from the head CT
03/13/2020. And bilateral sulci do not appear to have changed.
Ventricle size is stable and within normal limits.

Increased hypodensity in the left pons corresponding to known
infarct. No acute intracranial hemorrhage identified. No new
cortically based infarct identified. No intracranial mass effect.
Normal basilar cisterns.

Vascular: No suspicious intracranial vascular hyperdensity.

Skull: No acute osseous abnormality identified.

Sinuses/Orbits: Mild sinus opacification in the setting of
intubation. Tympanic cavities and mastoids remain clear.

Other: No acute orbit or scalp soft tissue finding. Fluid in the
pharynx in the setting of intubation.
IMPRESSION: 1. No evidence of anoxic injury by CT.
Inconspicuous gray-white matter differentiation throughout the
brain, but stable from CT 03/13/2020 that predated the MRI
(03/15/2020) which was negative for diffuse anoxic injury.

2. Expected evolution of left pontine infarct seen recently by MRI.
No intracranial hemorrhage or mass effect.
